# Patient Record
Sex: Female | Born: 1945 | Race: White | Hispanic: No | Marital: Married | Smoking: Former smoker
Health system: Southern US, Community
[De-identification: ages and names within clinical notes are randomized; demographics above are authoritative.]

## PROBLEM LIST (undated history)

## (undated) DIAGNOSIS — K5900 Constipation, unspecified: Secondary | ICD-10-CM

## (undated) DIAGNOSIS — M79671 Pain in right foot: Secondary | ICD-10-CM

## (undated) DIAGNOSIS — R6889 Other general symptoms and signs: Secondary | ICD-10-CM

## (undated) DIAGNOSIS — K219 Gastro-esophageal reflux disease without esophagitis: Secondary | ICD-10-CM

## (undated) DIAGNOSIS — F32A Depression, unspecified: Secondary | ICD-10-CM

## (undated) DIAGNOSIS — K759 Inflammatory liver disease, unspecified: Secondary | ICD-10-CM

## (undated) DIAGNOSIS — M2550 Pain in unspecified joint: Secondary | ICD-10-CM

## (undated) HISTORY — PX: CATARACT EXTRACTION: SUR2

## (undated) HISTORY — DX: Pain in right foot: M79.671

## (undated) HISTORY — PX: LAPAROSCOPY: SHX197

## (undated) HISTORY — PX: TONSILLECTOMY: SUR1361

## (undated) HISTORY — PX: REDUCTION MAMMAPLASTY: SUR839

## (undated) HISTORY — PX: LIVER BIOPSY: SHX301

## (undated) HISTORY — DX: Other general symptoms and signs: R68.89

## (undated) HISTORY — DX: Depression, unspecified: F32.A

## (undated) HISTORY — PX: BREAST BIOPSY: SHX20

## (undated) HISTORY — DX: Pain in unspecified joint: M25.50

## (undated) HISTORY — DX: Gastro-esophageal reflux disease without esophagitis: K21.9

## (undated) HISTORY — DX: Inflammatory liver disease, unspecified: K75.9

## (undated) HISTORY — DX: Constipation, unspecified: K59.00

## (undated) HISTORY — PX: OOPHORECTOMY: SHX86

---

## 2015-05-14 DIAGNOSIS — M25511 Pain in right shoulder: Secondary | ICD-10-CM | POA: Diagnosis not present

## 2015-05-14 DIAGNOSIS — S43431A Superior glenoid labrum lesion of right shoulder, initial encounter: Secondary | ICD-10-CM | POA: Diagnosis not present

## 2015-05-15 DIAGNOSIS — H25813 Combined forms of age-related cataract, bilateral: Secondary | ICD-10-CM | POA: Diagnosis not present

## 2015-05-16 DIAGNOSIS — R5383 Other fatigue: Secondary | ICD-10-CM | POA: Diagnosis not present

## 2015-05-28 DIAGNOSIS — H0011 Chalazion right upper eyelid: Secondary | ICD-10-CM | POA: Diagnosis not present

## 2015-05-28 DIAGNOSIS — J209 Acute bronchitis, unspecified: Secondary | ICD-10-CM | POA: Diagnosis not present

## 2015-06-01 DIAGNOSIS — M19071 Primary osteoarthritis, right ankle and foot: Secondary | ICD-10-CM | POA: Diagnosis not present

## 2015-06-11 DIAGNOSIS — M5430 Sciatica, unspecified side: Secondary | ICD-10-CM | POA: Diagnosis not present

## 2015-06-11 DIAGNOSIS — G5701 Lesion of sciatic nerve, right lower limb: Secondary | ICD-10-CM | POA: Diagnosis not present

## 2015-06-18 DIAGNOSIS — G5701 Lesion of sciatic nerve, right lower limb: Secondary | ICD-10-CM | POA: Diagnosis not present

## 2015-06-19 DIAGNOSIS — T1511XA Foreign body in conjunctival sac, right eye, initial encounter: Secondary | ICD-10-CM | POA: Diagnosis not present

## 2015-07-19 DIAGNOSIS — N951 Menopausal and female climacteric states: Secondary | ICD-10-CM | POA: Diagnosis not present

## 2015-08-29 DIAGNOSIS — B351 Tinea unguium: Secondary | ICD-10-CM | POA: Diagnosis not present

## 2015-10-18 DIAGNOSIS — R05 Cough: Secondary | ICD-10-CM | POA: Diagnosis not present

## 2015-10-24 DIAGNOSIS — D225 Melanocytic nevi of trunk: Secondary | ICD-10-CM | POA: Diagnosis not present

## 2015-11-13 DIAGNOSIS — K5900 Constipation, unspecified: Secondary | ICD-10-CM | POA: Diagnosis not present

## 2015-11-14 DIAGNOSIS — Z1231 Encounter for screening mammogram for malignant neoplasm of breast: Secondary | ICD-10-CM | POA: Diagnosis not present

## 2015-12-25 DIAGNOSIS — K5900 Constipation, unspecified: Secondary | ICD-10-CM | POA: Diagnosis not present

## 2016-02-02 DIAGNOSIS — T188XXA Foreign body in other parts of alimentary tract, initial encounter: Secondary | ICD-10-CM | POA: Diagnosis not present

## 2016-02-02 DIAGNOSIS — K449 Diaphragmatic hernia without obstruction or gangrene: Secondary | ICD-10-CM | POA: Diagnosis not present

## 2016-02-02 DIAGNOSIS — T18108A Unspecified foreign body in esophagus causing other injury, initial encounter: Secondary | ICD-10-CM | POA: Diagnosis not present

## 2016-02-02 DIAGNOSIS — T18100A Unspecified foreign body in esophagus causing compression of trachea, initial encounter: Secondary | ICD-10-CM | POA: Diagnosis not present

## 2016-02-02 DIAGNOSIS — T189XXA Foreign body of alimentary tract, part unspecified, initial encounter: Secondary | ICD-10-CM | POA: Diagnosis not present

## 2016-02-02 DIAGNOSIS — K2970 Gastritis, unspecified, without bleeding: Secondary | ICD-10-CM | POA: Diagnosis not present

## 2016-02-08 DIAGNOSIS — M25511 Pain in right shoulder: Secondary | ICD-10-CM | POA: Diagnosis not present

## 2016-02-08 DIAGNOSIS — M75101 Unspecified rotator cuff tear or rupture of right shoulder, not specified as traumatic: Secondary | ICD-10-CM | POA: Diagnosis not present

## 2016-02-11 DIAGNOSIS — M25511 Pain in right shoulder: Secondary | ICD-10-CM | POA: Diagnosis not present

## 2016-02-11 DIAGNOSIS — M75101 Unspecified rotator cuff tear or rupture of right shoulder, not specified as traumatic: Secondary | ICD-10-CM | POA: Diagnosis not present

## 2016-02-13 DIAGNOSIS — M25511 Pain in right shoulder: Secondary | ICD-10-CM | POA: Diagnosis not present

## 2016-02-13 DIAGNOSIS — M75101 Unspecified rotator cuff tear or rupture of right shoulder, not specified as traumatic: Secondary | ICD-10-CM | POA: Diagnosis not present

## 2016-02-15 DIAGNOSIS — M25511 Pain in right shoulder: Secondary | ICD-10-CM | POA: Diagnosis not present

## 2016-02-15 DIAGNOSIS — M75101 Unspecified rotator cuff tear or rupture of right shoulder, not specified as traumatic: Secondary | ICD-10-CM | POA: Diagnosis not present

## 2016-02-18 DIAGNOSIS — M25511 Pain in right shoulder: Secondary | ICD-10-CM | POA: Diagnosis not present

## 2016-02-18 DIAGNOSIS — M75101 Unspecified rotator cuff tear or rupture of right shoulder, not specified as traumatic: Secondary | ICD-10-CM | POA: Diagnosis not present

## 2016-02-20 DIAGNOSIS — M75101 Unspecified rotator cuff tear or rupture of right shoulder, not specified as traumatic: Secondary | ICD-10-CM | POA: Diagnosis not present

## 2016-02-20 DIAGNOSIS — M25511 Pain in right shoulder: Secondary | ICD-10-CM | POA: Diagnosis not present

## 2016-02-26 DIAGNOSIS — M75101 Unspecified rotator cuff tear or rupture of right shoulder, not specified as traumatic: Secondary | ICD-10-CM | POA: Diagnosis not present

## 2016-02-26 DIAGNOSIS — M25511 Pain in right shoulder: Secondary | ICD-10-CM | POA: Diagnosis not present

## 2016-02-27 DIAGNOSIS — M25511 Pain in right shoulder: Secondary | ICD-10-CM | POA: Diagnosis not present

## 2016-02-27 DIAGNOSIS — M75101 Unspecified rotator cuff tear or rupture of right shoulder, not specified as traumatic: Secondary | ICD-10-CM | POA: Diagnosis not present

## 2016-02-29 DIAGNOSIS — M25511 Pain in right shoulder: Secondary | ICD-10-CM | POA: Diagnosis not present

## 2016-02-29 DIAGNOSIS — N951 Menopausal and female climacteric states: Secondary | ICD-10-CM | POA: Diagnosis not present

## 2016-02-29 DIAGNOSIS — M75101 Unspecified rotator cuff tear or rupture of right shoulder, not specified as traumatic: Secondary | ICD-10-CM | POA: Diagnosis not present

## 2016-03-03 DIAGNOSIS — Z9289 Personal history of other medical treatment: Secondary | ICD-10-CM | POA: Diagnosis not present

## 2016-03-03 DIAGNOSIS — M75101 Unspecified rotator cuff tear or rupture of right shoulder, not specified as traumatic: Secondary | ICD-10-CM | POA: Diagnosis not present

## 2016-03-03 DIAGNOSIS — Z01419 Encounter for gynecological examination (general) (routine) without abnormal findings: Secondary | ICD-10-CM | POA: Diagnosis not present

## 2016-03-03 DIAGNOSIS — N952 Postmenopausal atrophic vaginitis: Secondary | ICD-10-CM | POA: Diagnosis not present

## 2016-03-03 DIAGNOSIS — Z1151 Encounter for screening for human papillomavirus (HPV): Secondary | ICD-10-CM | POA: Diagnosis not present

## 2016-03-03 DIAGNOSIS — M25511 Pain in right shoulder: Secondary | ICD-10-CM | POA: Diagnosis not present

## 2016-03-04 DIAGNOSIS — M25511 Pain in right shoulder: Secondary | ICD-10-CM | POA: Diagnosis not present

## 2016-03-04 DIAGNOSIS — M75101 Unspecified rotator cuff tear or rupture of right shoulder, not specified as traumatic: Secondary | ICD-10-CM | POA: Diagnosis not present

## 2016-03-06 DIAGNOSIS — M25511 Pain in right shoulder: Secondary | ICD-10-CM | POA: Diagnosis not present

## 2016-03-06 DIAGNOSIS — M75101 Unspecified rotator cuff tear or rupture of right shoulder, not specified as traumatic: Secondary | ICD-10-CM | POA: Diagnosis not present

## 2016-03-11 DIAGNOSIS — M25511 Pain in right shoulder: Secondary | ICD-10-CM | POA: Diagnosis not present

## 2016-03-11 DIAGNOSIS — M75101 Unspecified rotator cuff tear or rupture of right shoulder, not specified as traumatic: Secondary | ICD-10-CM | POA: Diagnosis not present

## 2016-03-13 DIAGNOSIS — M75101 Unspecified rotator cuff tear or rupture of right shoulder, not specified as traumatic: Secondary | ICD-10-CM | POA: Diagnosis not present

## 2016-03-13 DIAGNOSIS — M25511 Pain in right shoulder: Secondary | ICD-10-CM | POA: Diagnosis not present

## 2016-03-14 DIAGNOSIS — M75101 Unspecified rotator cuff tear or rupture of right shoulder, not specified as traumatic: Secondary | ICD-10-CM | POA: Diagnosis not present

## 2016-03-14 DIAGNOSIS — M25511 Pain in right shoulder: Secondary | ICD-10-CM | POA: Diagnosis not present

## 2016-04-29 DIAGNOSIS — M25561 Pain in right knee: Secondary | ICD-10-CM | POA: Diagnosis not present

## 2016-04-29 DIAGNOSIS — B078 Other viral warts: Secondary | ICD-10-CM | POA: Diagnosis not present

## 2016-04-29 DIAGNOSIS — L298 Other pruritus: Secondary | ICD-10-CM | POA: Diagnosis not present

## 2016-05-01 DIAGNOSIS — H0012 Chalazion right lower eyelid: Secondary | ICD-10-CM | POA: Diagnosis not present

## 2016-05-01 DIAGNOSIS — H2513 Age-related nuclear cataract, bilateral: Secondary | ICD-10-CM | POA: Diagnosis not present

## 2016-05-07 DIAGNOSIS — K5900 Constipation, unspecified: Secondary | ICD-10-CM | POA: Diagnosis not present

## 2016-05-07 DIAGNOSIS — K5730 Diverticulosis of large intestine without perforation or abscess without bleeding: Secondary | ICD-10-CM | POA: Diagnosis not present

## 2016-05-07 DIAGNOSIS — Z1211 Encounter for screening for malignant neoplasm of colon: Secondary | ICD-10-CM | POA: Diagnosis not present

## 2016-05-07 DIAGNOSIS — K6389 Other specified diseases of intestine: Secondary | ICD-10-CM | POA: Diagnosis not present

## 2016-07-04 DIAGNOSIS — M25572 Pain in left ankle and joints of left foot: Secondary | ICD-10-CM | POA: Diagnosis not present

## 2016-08-12 DIAGNOSIS — H0014 Chalazion left upper eyelid: Secondary | ICD-10-CM | POA: Diagnosis not present

## 2016-09-01 DIAGNOSIS — Z0000 Encounter for general adult medical examination without abnormal findings: Secondary | ICD-10-CM | POA: Diagnosis not present

## 2016-09-01 DIAGNOSIS — N951 Menopausal and female climacteric states: Secondary | ICD-10-CM | POA: Diagnosis not present

## 2016-09-01 DIAGNOSIS — Z008 Encounter for other general examination: Secondary | ICD-10-CM | POA: Diagnosis not present

## 2016-09-03 DIAGNOSIS — K920 Hematemesis: Secondary | ICD-10-CM | POA: Diagnosis not present

## 2016-09-29 DIAGNOSIS — L2089 Other atopic dermatitis: Secondary | ICD-10-CM | POA: Diagnosis not present

## 2016-11-06 DIAGNOSIS — L723 Sebaceous cyst: Secondary | ICD-10-CM | POA: Diagnosis not present

## 2016-11-06 DIAGNOSIS — D492 Neoplasm of unspecified behavior of bone, soft tissue, and skin: Secondary | ICD-10-CM | POA: Diagnosis not present

## 2016-11-06 DIAGNOSIS — D224 Melanocytic nevi of scalp and neck: Secondary | ICD-10-CM | POA: Diagnosis not present

## 2016-11-10 DIAGNOSIS — C44511 Basal cell carcinoma of skin of breast: Secondary | ICD-10-CM | POA: Diagnosis not present

## 2016-11-12 DIAGNOSIS — Z1231 Encounter for screening mammogram for malignant neoplasm of breast: Secondary | ICD-10-CM | POA: Diagnosis not present

## 2016-11-13 DIAGNOSIS — C44511 Basal cell carcinoma of skin of breast: Secondary | ICD-10-CM | POA: Diagnosis not present

## 2017-01-06 DIAGNOSIS — B078 Other viral warts: Secondary | ICD-10-CM | POA: Diagnosis not present

## 2017-01-06 DIAGNOSIS — L723 Sebaceous cyst: Secondary | ICD-10-CM | POA: Diagnosis not present

## 2017-03-13 DIAGNOSIS — N951 Menopausal and female climacteric states: Secondary | ICD-10-CM | POA: Diagnosis not present

## 2017-03-16 DIAGNOSIS — Z124 Encounter for screening for malignant neoplasm of cervix: Secondary | ICD-10-CM | POA: Diagnosis not present

## 2017-03-18 DIAGNOSIS — L739 Follicular disorder, unspecified: Secondary | ICD-10-CM | POA: Diagnosis not present

## 2017-03-18 DIAGNOSIS — L821 Other seborrheic keratosis: Secondary | ICD-10-CM | POA: Diagnosis not present

## 2017-03-18 DIAGNOSIS — L219 Seborrheic dermatitis, unspecified: Secondary | ICD-10-CM | POA: Diagnosis not present

## 2017-04-15 DIAGNOSIS — L700 Acne vulgaris: Secondary | ICD-10-CM | POA: Diagnosis not present

## 2017-04-15 DIAGNOSIS — Z411 Encounter for cosmetic surgery: Secondary | ICD-10-CM | POA: Diagnosis not present

## 2017-05-21 DIAGNOSIS — Z411 Encounter for cosmetic surgery: Secondary | ICD-10-CM | POA: Diagnosis not present

## 2017-05-21 DIAGNOSIS — L308 Other specified dermatitis: Secondary | ICD-10-CM | POA: Diagnosis not present

## 2017-05-21 DIAGNOSIS — L814 Other melanin hyperpigmentation: Secondary | ICD-10-CM | POA: Diagnosis not present

## 2017-05-21 DIAGNOSIS — D485 Neoplasm of uncertain behavior of skin: Secondary | ICD-10-CM | POA: Diagnosis not present

## 2017-06-16 DIAGNOSIS — F5101 Primary insomnia: Secondary | ICD-10-CM | POA: Diagnosis not present

## 2017-06-16 DIAGNOSIS — N951 Menopausal and female climacteric states: Secondary | ICD-10-CM | POA: Diagnosis not present

## 2017-08-11 ENCOUNTER — Encounter: Payer: Self-pay | Admitting: Sports Medicine

## 2017-08-11 ENCOUNTER — Ambulatory Visit (INDEPENDENT_AMBULATORY_CARE_PROVIDER_SITE_OTHER): Payer: Medicare Other | Admitting: Sports Medicine

## 2017-08-11 ENCOUNTER — Ambulatory Visit (INDEPENDENT_AMBULATORY_CARE_PROVIDER_SITE_OTHER): Payer: Medicare Other

## 2017-08-11 DIAGNOSIS — M25551 Pain in right hip: Secondary | ICD-10-CM

## 2017-08-11 DIAGNOSIS — M76891 Other specified enthesopathies of right lower limb, excluding foot: Secondary | ICD-10-CM

## 2017-08-11 DIAGNOSIS — G8929 Other chronic pain: Secondary | ICD-10-CM | POA: Insufficient documentation

## 2017-08-11 DIAGNOSIS — M7611 Psoas tendinitis, right hip: Secondary | ICD-10-CM | POA: Diagnosis not present

## 2017-08-11 IMAGING — DX DG HIP (WITH OR WITHOUT PELVIS) 2-3V*R*
3 series · 3 of 3 positions shown · non-contrast
Comparison: None.

CLINICAL DATA: Hip flexor tendinitis, right hip pain

EXAM:
DG HIP (WITH OR WITHOUT PELVIS) 2-3V RIGHT

[pelvis ap]
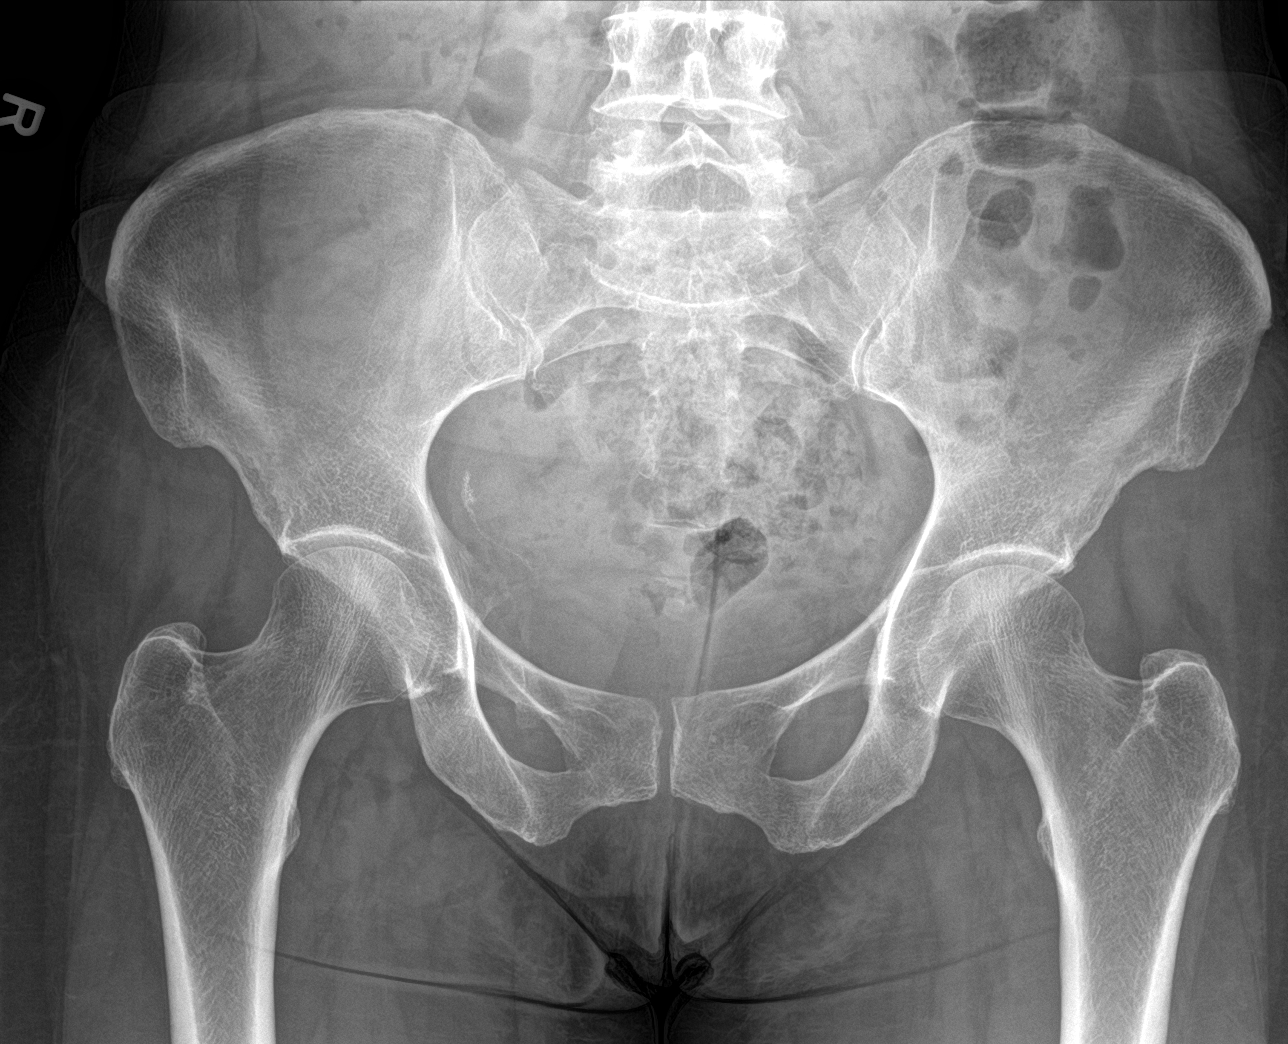

[hip ap]
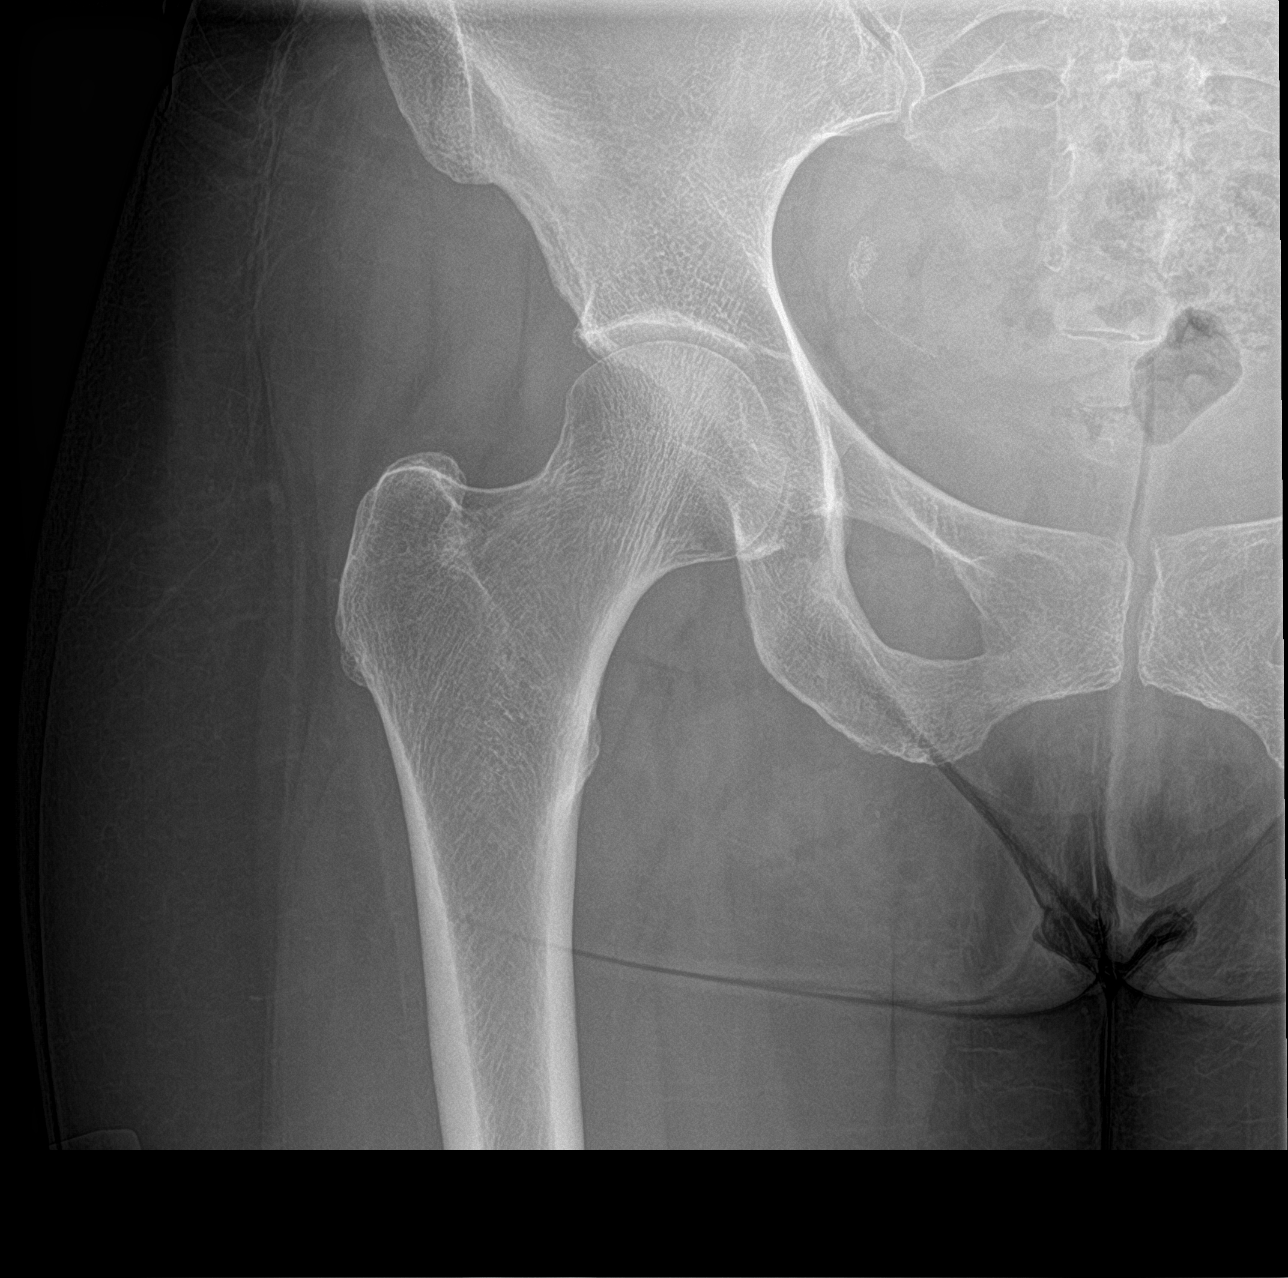

[hip frog leg]
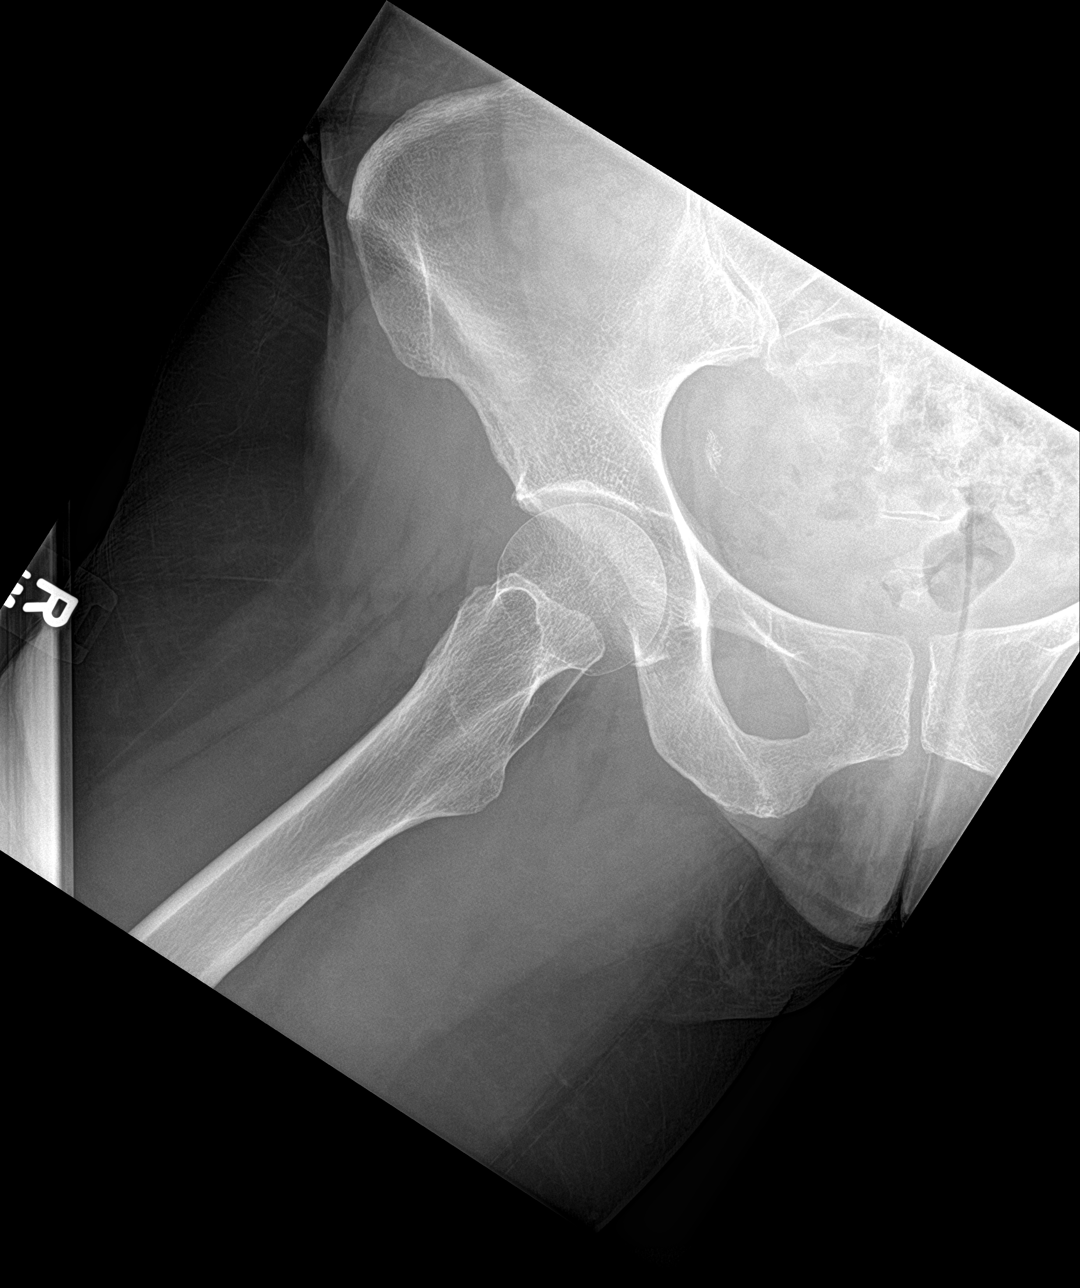

[3 of 3 positions shown; findings below may reference images not displayed]

FINDINGS: Hip joints and SI joints are symmetric and unremarkable. No acute
bony abnormality. Specifically, no fracture, subluxation, or
dislocation.
IMPRESSION: No acute bony abnormality.

## 2017-08-11 MED ORDER — IBUPROFEN 800 MG PO TABS: 800.0000 mg | ORAL_TABLET | Freq: Three times a day (TID) | ORAL | 2 refills | Status: DC | PRN

## 2017-08-11 NOTE — Progress Notes (Signed)
Subjective:    I'm seeing this patient as a consultation for: Dr. Greig Right  CC: Right hip pain  HPI: This is a very pleasant 72 year old female, she moved to [place] recently from [place] with her boyfriend, has been exercising, trying to get in shape, unfortunately she started to have pain that she localizes over the anterior aspect of her right hip and groin, worse with hip flexion, and certain poses and Pilates.  No mechanical symptoms, no trauma, no constitutional symptoms.  I reviewed the past medical history, family history, social history, surgical history, and allergies today and no changes were needed.  Please see the problem list section below in epic for further details.  Past Medical History: Past Medical History:  Diagnosis Date  . Constipation   . Hepatitis   . Joint pain    Past Surgical History: Past Surgical History:  Procedure Laterality Date  . LAPAROSCOPY    . OOPHORECTOMY     Social History: Social History   Socioeconomic History  . Marital status: Widowed    Spouse name: Not on file  . Number of children: Not on file  . Years of education: Not on file  . Highest education level: Not on file  Occupational History  . Not on file  Social Needs  . Financial resource strain: Not on file  . Food insecurity:    Worry: Not on file    Inability: Not on file  . Transportation needs:    Medical: Not on file    Non-medical: Not on file  Tobacco Use  . Smoking status: Never Smoker  . Smokeless tobacco: Never Used  Substance and Sexual Activity  . Alcohol use: Never    Frequency: Never  . Drug use: Never  . Sexual activity: Not on file  Lifestyle  . Physical activity:    Days per week: Not on file    Minutes per session: Not on file  . Stress: Not on file  Relationships  . Social connections:    Talks on phone: Not on file    Gets together: Not on file    Attends religious service: Not on file    Active member of club or organization:  Not on file    Attends meetings of clubs or organizations: Not on file    Relationship status: Not on file  Other Topics Concern  . Not on file  Social History Narrative  . Not on file   Family History: No family history on file. Allergies: Allergies  Allergen Reactions  . Sulfa Antibiotics    Medications: See med rec.  Review of Systems: No headache, visual changes, nausea, vomiting, diarrhea, constipation, dizziness, abdominal pain, skin rash, fevers, chills, night sweats, weight loss, swollen lymph nodes, body aches, joint swelling, muscle aches, chest pain, shortness of breath, mood changes, visual or auditory hallucinations.   Objective:   General: Well Developed, well nourished, and in no acute distress.  Neuro:  Extra-ocular muscles intact, able to move all 4 extremities, sensation grossly intact.  Deep tendon reflexes tested were normal. Psych: Alert and oriented, mood congruent with affect. ENT:  Ears and nose appear unremarkable.  Hearing grossly normal. Neck: Unremarkable overall appearance, trachea midline.  No visible thyroid enlargement. Eyes: Conjunctivae and lids appear unremarkable.  Pupils equal and round. Skin: Warm and dry, no rashes noted.  Cardiovascular: Pulses palpable, no extremity edema. Right hip: ROM IR: 60 Deg, ER: 60 Deg, Flexion: 120 Deg, Extension: 100 Deg, Abduction: 45 Deg,  Adduction: 45 Deg Strength IR: 5/5, ER: 5/5, Flexion: 4- /5 with reproduction of pain, Extension: 5/5, Abduction: 5/5, Adduction: 5/5 Pelvic alignment unremarkable to inspection and palpation. Standing hip rotation and gait without trendelenburg / unsteadiness. Greater trochanter without tenderness to palpation. No tenderness over piriformis. No SI joint tenderness and normal minimal SI movement. Negative flexion abduction external rotation and flexion abduction internal rotation testing.   Hip x-rays personally reviewed, overall unremarkable, no visible  osteoarthritis.  Impression and Recommendations:   This case required medical decision making of moderate complexity.  Hip flexor tendinitis, right X-rays, ibuprofen 800, formal PT. Pain is reproducible with resisted flexion of the hip, negative FABER/FADIR testing, no pain with internal rotation. No tenderness over the greater trochanter. Return to see me in 1 month, MRI if no better. ___________________________________________ Gwen Her. Dianah Field, M.D., ABFM., CAQSM. Primary Care and [place] Instructor of [place] of [facility] of Medicine

## 2017-08-11 NOTE — Assessment & Plan Note (Signed)
X-rays, ibuprofen 800, formal PT. Pain is reproducible with resisted flexion of the hip, negative FABER/FADIR testing, no pain with internal rotation. No tenderness over the greater trochanter. Return to see me in 1 month, MRI if no better.

## 2017-08-17 DIAGNOSIS — M76891 Other specified enthesopathies of right lower limb, excluding foot: Secondary | ICD-10-CM | POA: Diagnosis not present

## 2017-08-17 DIAGNOSIS — M25551 Pain in right hip: Secondary | ICD-10-CM | POA: Diagnosis not present

## 2017-08-24 DIAGNOSIS — M25551 Pain in right hip: Secondary | ICD-10-CM | POA: Diagnosis not present

## 2017-08-24 DIAGNOSIS — M76891 Other specified enthesopathies of right lower limb, excluding foot: Secondary | ICD-10-CM | POA: Diagnosis not present

## 2017-08-27 DIAGNOSIS — M25551 Pain in right hip: Secondary | ICD-10-CM | POA: Diagnosis not present

## 2017-08-27 DIAGNOSIS — M76891 Other specified enthesopathies of right lower limb, excluding foot: Secondary | ICD-10-CM | POA: Diagnosis not present

## 2017-09-01 DIAGNOSIS — M25551 Pain in right hip: Secondary | ICD-10-CM | POA: Diagnosis not present

## 2017-09-01 DIAGNOSIS — M76891 Other specified enthesopathies of right lower limb, excluding foot: Secondary | ICD-10-CM | POA: Diagnosis not present

## 2017-09-02 DIAGNOSIS — M25551 Pain in right hip: Secondary | ICD-10-CM | POA: Diagnosis not present

## 2017-09-02 DIAGNOSIS — M76891 Other specified enthesopathies of right lower limb, excluding foot: Secondary | ICD-10-CM | POA: Diagnosis not present

## 2017-09-08 DIAGNOSIS — M76891 Other specified enthesopathies of right lower limb, excluding foot: Secondary | ICD-10-CM | POA: Diagnosis not present

## 2017-09-08 DIAGNOSIS — M25551 Pain in right hip: Secondary | ICD-10-CM | POA: Diagnosis not present

## 2017-09-10 ENCOUNTER — Encounter: Payer: Self-pay | Admitting: Sports Medicine

## 2017-09-10 ENCOUNTER — Ambulatory Visit (INDEPENDENT_AMBULATORY_CARE_PROVIDER_SITE_OTHER): Payer: Medicare Other | Admitting: Sports Medicine

## 2017-09-10 DIAGNOSIS — M533 Sacrococcygeal disorders, not elsewhere classified: Secondary | ICD-10-CM

## 2017-09-10 DIAGNOSIS — M76891 Other specified enthesopathies of right lower limb, excluding foot: Secondary | ICD-10-CM

## 2017-09-10 HISTORY — DX: Sacrococcygeal disorders, not elsewhere classified: M53.3

## 2017-09-10 NOTE — Progress Notes (Signed)
Subjective:    I'm seeing this patient as a consultation for: Dr. Greig Right  CC: Right-sided low back pain  HPI: This is a pleasant 72 year old female, we treated her not too long ago for hip flexor tendinopathy which is now resolved after formal physical therapy.  Unfortunately she has now started to have pain that she localizes in her back, at the SI joint without much radiation, worse when standing on the ipsilateral side.  Not worse with sitting, flexion, Valsalva, no bowel or bladder dysfunction, saddle numbness, constitutional symptoms.  She is getting married in a week.  I reviewed the past medical history, family history, social history, surgical history, and allergies today and no changes were needed.  Please see the problem list section below in epic for further details.  Past Medical History: Past Medical History:  Diagnosis Date  . Constipation   . Hepatitis   . Joint pain    Past Surgical History: Past Surgical History:  Procedure Laterality Date  . LAPAROSCOPY    . OOPHORECTOMY     Social History: Social History   Socioeconomic History  . Marital status: Widowed    Spouse name: Not on file  . Number of children: Not on file  . Years of education: Not on file  . Highest education level: Not on file  Occupational History  . Not on file  Social Needs  . Financial resource strain: Not on file  . Food insecurity:    Worry: Not on file    Inability: Not on file  . Transportation needs:    Medical: Not on file    Non-medical: Not on file  Tobacco Use  . Smoking status: Never Smoker  . Smokeless tobacco: Never Used  Substance and Sexual Activity  . Alcohol use: Never    Frequency: Never  . Drug use: Never  . Sexual activity: Not on file  Lifestyle  . Physical activity:    Days per week: Not on file    Minutes per session: Not on file  . Stress: Not on file  Relationships  . Social connections:    Talks on phone: Not on file    Gets together:  Not on file    Attends religious service: Not on file    Active member of club or organization: Not on file    Attends meetings of clubs or organizations: Not on file    Relationship status: Not on file  Other Topics Concern  . Not on file  Social History Narrative  . Not on file   Family History: No family history on file. Allergies: Allergies  Allergen Reactions  . Sulfa Antibiotics    Medications: See med rec.  Review of Systems: No headache, visual changes, nausea, vomiting, diarrhea, constipation, dizziness, abdominal pain, skin rash, fevers, chills, night sweats, weight loss, swollen lymph nodes, body aches, joint swelling, muscle aches, chest pain, shortness of breath, mood changes, visual or auditory hallucinations.   Objective:   General: Well Developed, well nourished, and in no acute distress.  Neuro:  Extra-ocular muscles intact, able to move all 4 extremities, sensation grossly intact.  Deep tendon reflexes tested were normal. Psych: Alert and oriented, mood congruent with affect. ENT:  Ears and nose appear unremarkable.  Hearing grossly normal. Neck: Unremarkable overall appearance, trachea midline.  No visible thyroid enlargement. Eyes: Conjunctivae and lids appear unremarkable.  Pupils equal and round. Skin: Warm and dry, no rashes noted.  Cardiovascular: Pulses palpable, no extremity edema. Right hip:  ROM IR: 60 Deg, ER: 60 Deg, Flexion: 120 Deg, Extension: 100 Deg, Abduction: 45 Deg, Adduction: 45 Deg Strength IR: 5/5, ER: 5/5, Flexion: 5/5, Extension: 5/5, Abduction: 5/5, Adduction: 5/5 Pelvic alignment unremarkable to inspection and palpation. Standing hip rotation and gait without trendelenburg / unsteadiness. Greater trochanter without tenderness to palpation. No tenderness over piriformis. Moderate tenderness over the sacroiliac joint. Excellent strength to flexion of the hip joint without pain.  Impression and Recommendations:   This case required  medical decision making of moderate complexity.  Pain of right sacroiliac joint We will start conservatively. SI joint rehab exercises, return in a month, injection if no better.  Hip flexor tendinitis, right Resolved with conservative measures, good strength, good motion.  No pain.  ___________________________________________ Gwen Her. Dianah Field, M.D., ABFM., CAQSM. Primary Care and [place] Instructor of [place] of [facility] of Medicine

## 2017-09-10 NOTE — Assessment & Plan Note (Addendum)
We will start conservatively. SI joint rehab exercises, return in a month, injection if no better.

## 2017-09-10 NOTE — Assessment & Plan Note (Signed)
Resolved with conservative measures, good strength, good motion.  No pain.

## 2017-09-23 DIAGNOSIS — Z79899 Other long term (current) drug therapy: Secondary | ICD-10-CM | POA: Diagnosis not present

## 2017-09-23 DIAGNOSIS — E559 Vitamin D deficiency, unspecified: Secondary | ICD-10-CM | POA: Diagnosis not present

## 2017-09-23 DIAGNOSIS — M76891 Other specified enthesopathies of right lower limb, excluding foot: Secondary | ICD-10-CM | POA: Diagnosis not present

## 2017-09-23 DIAGNOSIS — M25551 Pain in right hip: Secondary | ICD-10-CM | POA: Diagnosis not present

## 2017-09-23 DIAGNOSIS — Z0000 Encounter for general adult medical examination without abnormal findings: Secondary | ICD-10-CM | POA: Diagnosis not present

## 2017-09-23 DIAGNOSIS — Z6822 Body mass index (BMI) 22.0-22.9, adult: Secondary | ICD-10-CM | POA: Diagnosis not present

## 2017-10-08 ENCOUNTER — Encounter: Payer: Self-pay | Admitting: Sports Medicine

## 2017-10-08 ENCOUNTER — Ambulatory Visit (INDEPENDENT_AMBULATORY_CARE_PROVIDER_SITE_OTHER): Payer: Medicare Other | Admitting: Sports Medicine

## 2017-10-08 DIAGNOSIS — M533 Sacrococcygeal disorders, not elsewhere classified: Secondary | ICD-10-CM | POA: Diagnosis not present

## 2017-10-08 NOTE — Progress Notes (Signed)
Subjective:    CC: Follow-up  HPI: Akya returns to discuss her SI joint pain and hip flexor pain, both have improved considerably, she has not done physical therapy in some time now, would like some more time in PT as she has not yet plateaued.  I reviewed the past medical history, family history, social history, surgical history, and allergies today and no changes were needed.  Please see the problem list section below in epic for further details.  Past Medical History: Past Medical History:  Diagnosis Date  . Constipation   . Hepatitis   . Joint pain    Past Surgical History: Past Surgical History:  Procedure Laterality Date  . LAPAROSCOPY    . OOPHORECTOMY     Social History: Social History   Socioeconomic History  . Marital status: Widowed    Spouse name: Not on file  . Number of children: Not on file  . Years of education: Not on file  . Highest education level: Not on file  Occupational History  . Not on file  Social Needs  . Financial resource strain: Not on file  . Food insecurity:    Worry: Not on file    Inability: Not on file  . Transportation needs:    Medical: Not on file    Non-medical: Not on file  Tobacco Use  . Smoking status: Never Smoker  . Smokeless tobacco: Never Used  Substance and Sexual Activity  . Alcohol use: Never    Frequency: Never  . Drug use: Never  . Sexual activity: Not on file  Lifestyle  . Physical activity:    Days per week: Not on file    Minutes per session: Not on file  . Stress: Not on file  Relationships  . Social connections:    Talks on phone: Not on file    Gets together: Not on file    Attends religious service: Not on file    Active member of club or organization: Not on file    Attends meetings of clubs or organizations: Not on file    Relationship status: Not on file  Other Topics Concern  . Not on file  Social History Narrative  . Not on file   Family History: No family history on  file. Allergies: Allergies  Allergen Reactions  . Sulfa Antibiotics    Medications: See med rec.  Review of Systems: No fevers, chills, night sweats, weight loss, chest pain, or shortness of breath.   Objective:    General: Well Developed, well nourished, and in no acute distress.  Neuro: Alert and oriented x3, extra-ocular muscles intact, sensation grossly intact.  HEENT: Normocephalic, atraumatic, pupils equal round reactive to light, neck supple, no masses, no lymphadenopathy, thyroid nonpalpable.  Skin: Warm and dry, no rashes. Cardiac: Regular rate and rhythm, no murmurs rubs or gallops, no lower extremity edema.  Respiratory: Clear to auscultation bilaterally. Not using accessory muscles, speaking in full sentences. Right hip: ROM IR: 60 Deg, ER: 60 Deg, Flexion: 120 Deg, Extension: 100 Deg, Abduction: 45 Deg, Adduction: 45 Deg Strength IR: 5/5, ER: 5/5, Flexion: 5/5, Extension: 5/5, Abduction: 5/5, Adduction: 5/5 Pelvic alignment unremarkable to inspection and palpation. Standing hip rotation and gait without trendelenburg / unsteadiness. Greater trochanter without tenderness to palpation. No tenderness over piriformis. No SI joint tenderness and normal minimal SI movement. Back Exam:  Inspection: Unremarkable  Motion: Flexion 45 deg, Extension 45 deg, Side Bending to 45 deg bilaterally,  Rotation to 45 deg bilaterally  SLR laying: Negative  XSLR laying: Negative  Palpable tenderness: Mild tenderness at the right sacroiliac joint. FABER: negative. Sensory change: Gross sensation intact to all lumbar and sacral dermatomes.  Reflexes: 2+ at both patellar tendons, 2+ at achilles tendons, Babinski's downgoing.  Strength at foot  Plantar-flexion: 5/5 Dorsi-flexion: 5/5 Eversion: 5/5 Inversion: 5/5  Leg strength  Quad: 5/5 Hamstring: 5/5 Hip flexor: 5/5 Hip abductors: 5/5  Gait unremarkable.  Impression and Recommendations:    Pain of right sacroiliac joint Improved  but still with some pain, we are going to add another month of physical therapy and regimen. Return in 1 month, we will consider an injection if no better. ___________________________________________ Gwen Her. Dianah Field, M.D., ABFM., CAQSM. Primary Care and [place] Instructor of [place] of [facility] of Medicine

## 2017-10-08 NOTE — Assessment & Plan Note (Signed)
Improved but still with some pain, we are going to add another month of physical therapy and regimen. Return in 1 month, we will consider an injection if no better.

## 2017-10-09 DIAGNOSIS — M76891 Other specified enthesopathies of right lower limb, excluding foot: Secondary | ICD-10-CM | POA: Diagnosis not present

## 2017-10-09 DIAGNOSIS — M25551 Pain in right hip: Secondary | ICD-10-CM | POA: Diagnosis not present

## 2017-10-09 DIAGNOSIS — M533 Sacrococcygeal disorders, not elsewhere classified: Secondary | ICD-10-CM | POA: Diagnosis not present

## 2017-10-13 DIAGNOSIS — M533 Sacrococcygeal disorders, not elsewhere classified: Secondary | ICD-10-CM | POA: Diagnosis not present

## 2017-10-13 DIAGNOSIS — M76891 Other specified enthesopathies of right lower limb, excluding foot: Secondary | ICD-10-CM | POA: Diagnosis not present

## 2017-10-13 DIAGNOSIS — M25551 Pain in right hip: Secondary | ICD-10-CM | POA: Diagnosis not present

## 2017-10-15 DIAGNOSIS — M25551 Pain in right hip: Secondary | ICD-10-CM | POA: Diagnosis not present

## 2017-10-15 DIAGNOSIS — M76891 Other specified enthesopathies of right lower limb, excluding foot: Secondary | ICD-10-CM | POA: Diagnosis not present

## 2017-10-15 DIAGNOSIS — M533 Sacrococcygeal disorders, not elsewhere classified: Secondary | ICD-10-CM | POA: Diagnosis not present

## 2017-10-19 DIAGNOSIS — M25551 Pain in right hip: Secondary | ICD-10-CM | POA: Diagnosis not present

## 2017-10-19 DIAGNOSIS — M533 Sacrococcygeal disorders, not elsewhere classified: Secondary | ICD-10-CM | POA: Diagnosis not present

## 2017-10-19 DIAGNOSIS — M76891 Other specified enthesopathies of right lower limb, excluding foot: Secondary | ICD-10-CM | POA: Diagnosis not present

## 2017-10-21 DIAGNOSIS — M533 Sacrococcygeal disorders, not elsewhere classified: Secondary | ICD-10-CM | POA: Diagnosis not present

## 2017-10-21 DIAGNOSIS — M25551 Pain in right hip: Secondary | ICD-10-CM | POA: Diagnosis not present

## 2017-10-21 DIAGNOSIS — M76891 Other specified enthesopathies of right lower limb, excluding foot: Secondary | ICD-10-CM | POA: Diagnosis not present

## 2017-10-22 DIAGNOSIS — L821 Other seborrheic keratosis: Secondary | ICD-10-CM | POA: Diagnosis not present

## 2017-10-22 DIAGNOSIS — Z85828 Personal history of other malignant neoplasm of skin: Secondary | ICD-10-CM | POA: Diagnosis not present

## 2017-10-22 DIAGNOSIS — L814 Other melanin hyperpigmentation: Secondary | ICD-10-CM | POA: Diagnosis not present

## 2017-10-22 DIAGNOSIS — D1801 Hemangioma of skin and subcutaneous tissue: Secondary | ICD-10-CM | POA: Diagnosis not present

## 2017-10-22 DIAGNOSIS — D485 Neoplasm of uncertain behavior of skin: Secondary | ICD-10-CM | POA: Diagnosis not present

## 2017-10-22 DIAGNOSIS — L570 Actinic keratosis: Secondary | ICD-10-CM | POA: Diagnosis not present

## 2017-10-22 DIAGNOSIS — D225 Melanocytic nevi of trunk: Secondary | ICD-10-CM | POA: Diagnosis not present

## 2017-10-26 DIAGNOSIS — M533 Sacrococcygeal disorders, not elsewhere classified: Secondary | ICD-10-CM | POA: Diagnosis not present

## 2017-10-26 DIAGNOSIS — M76891 Other specified enthesopathies of right lower limb, excluding foot: Secondary | ICD-10-CM | POA: Diagnosis not present

## 2017-10-26 DIAGNOSIS — M25551 Pain in right hip: Secondary | ICD-10-CM | POA: Diagnosis not present

## 2017-11-04 DIAGNOSIS — M25551 Pain in right hip: Secondary | ICD-10-CM | POA: Diagnosis not present

## 2017-11-04 DIAGNOSIS — M533 Sacrococcygeal disorders, not elsewhere classified: Secondary | ICD-10-CM | POA: Diagnosis not present

## 2017-11-04 DIAGNOSIS — M76891 Other specified enthesopathies of right lower limb, excluding foot: Secondary | ICD-10-CM | POA: Diagnosis not present

## 2017-11-12 ENCOUNTER — Ambulatory Visit (INDEPENDENT_AMBULATORY_CARE_PROVIDER_SITE_OTHER): Payer: Medicare Other | Admitting: Sports Medicine

## 2017-11-12 ENCOUNTER — Ambulatory Visit (INDEPENDENT_AMBULATORY_CARE_PROVIDER_SITE_OTHER): Payer: Medicare Other

## 2017-11-12 ENCOUNTER — Encounter: Payer: Self-pay | Admitting: Sports Medicine

## 2017-11-12 DIAGNOSIS — G8929 Other chronic pain: Secondary | ICD-10-CM

## 2017-11-12 DIAGNOSIS — M50321 Other cervical disc degeneration at C4-C5 level: Secondary | ICD-10-CM | POA: Diagnosis not present

## 2017-11-12 DIAGNOSIS — M25511 Pain in right shoulder: Secondary | ICD-10-CM | POA: Diagnosis not present

## 2017-11-12 DIAGNOSIS — M4802 Spinal stenosis, cervical region: Secondary | ICD-10-CM

## 2017-11-12 DIAGNOSIS — M542 Cervicalgia: Secondary | ICD-10-CM | POA: Diagnosis not present

## 2017-11-12 DIAGNOSIS — M533 Sacrococcygeal disorders, not elsewhere classified: Secondary | ICD-10-CM | POA: Diagnosis not present

## 2017-11-12 DIAGNOSIS — M5412 Radiculopathy, cervical region: Secondary | ICD-10-CM

## 2017-11-12 DIAGNOSIS — M47812 Spondylosis without myelopathy or radiculopathy, cervical region: Secondary | ICD-10-CM | POA: Diagnosis not present
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
## 2017-11-12 NOTE — Assessment & Plan Note (Signed)
Classic right periscapular cervical radiculitis. Referral for physical therapy with dry needling for this as well. I also want some baseline x-rays.

## 2017-11-12 NOTE — Assessment & Plan Note (Signed)
Almost completely resolved with dry needling, formal therapy. No injection was needed.

## 2017-11-12 NOTE — Assessment & Plan Note (Signed)
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

## 2017-11-12 NOTE — Progress Notes (Signed)
Subjective:    CC: Follow-up  HPI: SI joint pain: For the most part resolved with physical therapy and dry needling.  Neck pain: Radiating around the right periscapular region, worse with slouching, prolonged flexion of the neck, no radiculopathy down to the hands or fingertips, no weakness or dropping things, no constitutional symptoms or trauma.  Right shoulder pain: History of what she was told is a torn labrum, only minimal pain at the anterior and posterior joint lines without mechanical symptoms.  I reviewed the past medical history, family history, social history, surgical history, and allergies today and no changes were needed.  Please see the problem list section below in epic for further details.  Past Medical History: Past Medical History:  Diagnosis Date  . Constipation   . Hepatitis   . Joint pain    Past Surgical History: Past Surgical History:  Procedure Laterality Date  . LAPAROSCOPY    . OOPHORECTOMY     Social History: Social History   Socioeconomic History  . Marital status: Widowed    Spouse name: Not on file  . Number of children: Not on file  . Years of education: Not on file  . Highest education level: Not on file  Occupational History  . Not on file  Social Needs  . Financial resource strain: Not on file  . Food insecurity:    Worry: Not on file    Inability: Not on file  . Transportation needs:    Medical: Not on file    Non-medical: Not on file  Tobacco Use  . Smoking status: Never Smoker  . Smokeless tobacco: Never Used  Substance and Sexual Activity  . Alcohol use: Never    Frequency: Never  . Drug use: Never  . Sexual activity: Not on file  Lifestyle  . Physical activity:    Days per week: Not on file    Minutes per session: Not on file  . Stress: Not on file  Relationships  . Social connections:    Talks on phone: Not on file    Gets together: Not on file    Attends religious service: Not on file    Active member of club  or organization: Not on file    Attends meetings of clubs or organizations: Not on file    Relationship status: Not on file  Other Topics Concern  . Not on file  Social History Narrative  . Not on file   Family History: No family history on file. Allergies: Allergies  Allergen Reactions  . Sulfa Antibiotics    Medications: See med rec.  Review of Systems: No fevers, chills, night sweats, weight loss, chest pain, or shortness of breath.   Objective:    General: Well Developed, well nourished, and in no acute distress.  Neuro: Alert and oriented x3, extra-ocular muscles intact, sensation grossly intact.  HEENT: Normocephalic, atraumatic, pupils equal round reactive to light, neck supple, no masses, no lymphadenopathy, thyroid nonpalpable.  Skin: Warm and dry, no rashes. Cardiac: Regular rate and rhythm, no murmurs rubs or gallops, no lower extremity edema.  Respiratory: Clear to auscultation bilaterally. Not using accessory muscles, speaking in full sentences. Neck: Negative spurling's Full neck range of motion Grip strength and sensation normal in bilateral hands Strength good C4 to T1 distribution No sensory change to C4 to T1 Reflexes normal  Impression and Recommendations:    Radiculitis of right cervical region Classic right periscapular cervical radiculitis. Referral for physical therapy with dry needling for this  as well. I also want some baseline x-rays.  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  Pain of right sacroiliac joint Almost completely resolved with dry needling, formal therapy. No injection was needed. ___________________________________________ Gwen Her. Dianah Field, M.D., ABFM., CAQSM. Primary Care and [place] Instructor of [place] of [facility] of Medicine

## 2017-11-13 DIAGNOSIS — Z1231 Encounter for screening mammogram for malignant neoplasm of breast: Secondary | ICD-10-CM | POA: Diagnosis not present

## 2017-11-16 DIAGNOSIS — M76891 Other specified enthesopathies of right lower limb, excluding foot: Secondary | ICD-10-CM | POA: Diagnosis not present

## 2017-11-16 DIAGNOSIS — M25551 Pain in right hip: Secondary | ICD-10-CM | POA: Diagnosis not present

## 2017-11-16 DIAGNOSIS — M533 Sacrococcygeal disorders, not elsewhere classified: Secondary | ICD-10-CM | POA: Diagnosis not present

## 2017-11-17 DIAGNOSIS — M76891 Other specified enthesopathies of right lower limb, excluding foot: Secondary | ICD-10-CM | POA: Diagnosis not present

## 2017-11-17 DIAGNOSIS — M25551 Pain in right hip: Secondary | ICD-10-CM | POA: Diagnosis not present

## 2017-11-17 DIAGNOSIS — M542 Cervicalgia: Secondary | ICD-10-CM | POA: Diagnosis not present

## 2017-11-17 DIAGNOSIS — M533 Sacrococcygeal disorders, not elsewhere classified: Secondary | ICD-10-CM | POA: Diagnosis not present

## 2017-11-24 DIAGNOSIS — M25551 Pain in right hip: Secondary | ICD-10-CM | POA: Diagnosis not present

## 2017-11-24 DIAGNOSIS — M542 Cervicalgia: Secondary | ICD-10-CM | POA: Diagnosis not present

## 2017-11-24 DIAGNOSIS — M76891 Other specified enthesopathies of right lower limb, excluding foot: Secondary | ICD-10-CM | POA: Diagnosis not present

## 2017-11-24 DIAGNOSIS — M533 Sacrococcygeal disorders, not elsewhere classified: Secondary | ICD-10-CM | POA: Diagnosis not present

## 2017-11-26 DIAGNOSIS — M542 Cervicalgia: Secondary | ICD-10-CM | POA: Diagnosis not present

## 2017-11-26 DIAGNOSIS — M533 Sacrococcygeal disorders, not elsewhere classified: Secondary | ICD-10-CM | POA: Diagnosis not present

## 2017-11-26 DIAGNOSIS — M25551 Pain in right hip: Secondary | ICD-10-CM | POA: Diagnosis not present

## 2017-11-26 DIAGNOSIS — M76891 Other specified enthesopathies of right lower limb, excluding foot: Secondary | ICD-10-CM | POA: Diagnosis not present

## 2017-11-30 DIAGNOSIS — M76891 Other specified enthesopathies of right lower limb, excluding foot: Secondary | ICD-10-CM | POA: Diagnosis not present

## 2017-11-30 DIAGNOSIS — M533 Sacrococcygeal disorders, not elsewhere classified: Secondary | ICD-10-CM | POA: Diagnosis not present

## 2017-11-30 DIAGNOSIS — M542 Cervicalgia: Secondary | ICD-10-CM | POA: Diagnosis not present

## 2017-11-30 DIAGNOSIS — M25551 Pain in right hip: Secondary | ICD-10-CM | POA: Diagnosis not present

## 2017-12-02 DIAGNOSIS — M542 Cervicalgia: Secondary | ICD-10-CM | POA: Diagnosis not present

## 2017-12-02 DIAGNOSIS — M25551 Pain in right hip: Secondary | ICD-10-CM | POA: Diagnosis not present

## 2017-12-02 DIAGNOSIS — M76891 Other specified enthesopathies of right lower limb, excluding foot: Secondary | ICD-10-CM | POA: Diagnosis not present

## 2017-12-02 DIAGNOSIS — M533 Sacrococcygeal disorders, not elsewhere classified: Secondary | ICD-10-CM | POA: Diagnosis not present

## 2017-12-18 DIAGNOSIS — L237 Allergic contact dermatitis due to plants, except food: Secondary | ICD-10-CM | POA: Diagnosis not present

## 2017-12-23 DIAGNOSIS — L259 Unspecified contact dermatitis, unspecified cause: Secondary | ICD-10-CM | POA: Diagnosis not present

## 2017-12-24 ENCOUNTER — Ambulatory Visit: Payer: PRIVATE HEALTH INSURANCE | Admitting: Sports Medicine

## 2017-12-31 ENCOUNTER — Ambulatory Visit: Payer: Medicare Other | Admitting: Sports Medicine

## 2018-01-12 ENCOUNTER — Encounter: Payer: Self-pay | Admitting: Sports Medicine

## 2018-01-12 ENCOUNTER — Ambulatory Visit (INDEPENDENT_AMBULATORY_CARE_PROVIDER_SITE_OTHER): Payer: Medicare Other | Admitting: Sports Medicine

## 2018-01-12 DIAGNOSIS — M5412 Radiculopathy, cervical region: Secondary | ICD-10-CM | POA: Diagnosis not present

## 2018-01-12 NOTE — Progress Notes (Signed)
Subjective:    CC: Several issues  HPI: Cervical radiculitis: Improved slightly with formal physical therapy, she had right classic periscapular cervical symptoms.  She notes that when she is under stress or emotional distress her symptoms worsen.  Her sister was recently diagnosed with stage IV pancreatic cancer, she is thus hurting all over now.  At this point she would like to put aggressive treatment on the back burner for now.  I reviewed the past medical history, family history, social history, surgical history, and allergies today and no changes were needed.  Please see the problem list section below in epic for further details.  Past Medical History: Past Medical History:  Diagnosis Date  . Constipation   . Hepatitis   . Joint pain    Past Surgical History: Past Surgical History:  Procedure Laterality Date  . LAPAROSCOPY    . OOPHORECTOMY     Social History: Social History   Socioeconomic History  . Marital status: Widowed    Spouse name: Not on file  . Number of children: Not on file  . Years of education: Not on file  . Highest education level: Not on file  Occupational History  . Not on file  Social Needs  . Financial resource strain: Not on file  . Food insecurity:    Worry: Not on file    Inability: Not on file  . Transportation needs:    Medical: Not on file    Non-medical: Not on file  Tobacco Use  . Smoking status: Never Smoker  . Smokeless tobacco: Never Used  Substance and Sexual Activity  . Alcohol use: Never    Frequency: Never  . Drug use: Never  . Sexual activity: Not on file  Lifestyle  . Physical activity:    Days per week: Not on file    Minutes per session: Not on file  . Stress: Not on file  Relationships  . Social connections:    Talks on phone: Not on file    Gets together: Not on file    Attends religious service: Not on file    Active member of club or organization: Not on file    Attends meetings of clubs or organizations:  Not on file    Relationship status: Not on file  Other Topics Concern  . Not on file  Social History Narrative  . Not on file   Family History: No family history on file. Allergies: Allergies  Allergen Reactions  . Sulfa Antibiotics    Medications: See med rec.  Review of Systems: No fevers, chills, night sweats, weight loss, chest pain, or shortness of breath.   Objective:    General: Well Developed, well nourished, and in no acute distress.  Neuro: Alert and oriented x3, extra-ocular muscles intact, sensation grossly intact.  HEENT: Normocephalic, atraumatic, pupils equal round reactive to light, neck supple, no masses, no lymphadenopathy, thyroid nonpalpable.  Skin: Warm and dry, no rashes. Cardiac: Regular rate and rhythm, no murmurs rubs or gallops, no lower extremity edema.  Respiratory: Clear to auscultation bilaterally. Not using accessory muscles, speaking in full sentences.  Impression and Recommendations:    Radiculitis of right cervical region Doing okay, sister recently diagnosed with cancer, the increased stress has worsened her neck pain.  I spent 25 minutes with this patient, greater than 50% was face-to-face time counseling regarding the above diagnoses, the discussion was regarding psychosomatic interaction with regards to pain. ___________________________________________ Gwen Her. Dianah Field, M.D., ABFM., CAQSM. Primary Care and Sports  [place] Instructor of [place] of [facility] of Medicine

## 2018-01-12 NOTE — Assessment & Plan Note (Signed)
Doing okay, sister recently diagnosed with cancer, the increased stress has worsened her neck pain.

## 2018-03-08 DIAGNOSIS — F411 Generalized anxiety disorder: Secondary | ICD-10-CM | POA: Diagnosis not present

## 2018-03-08 DIAGNOSIS — Z6823 Body mass index (BMI) 23.0-23.9, adult: Secondary | ICD-10-CM | POA: Diagnosis not present

## 2018-05-12 DIAGNOSIS — J069 Acute upper respiratory infection, unspecified: Secondary | ICD-10-CM | POA: Diagnosis not present

## 2018-06-03 DIAGNOSIS — D485 Neoplasm of uncertain behavior of skin: Secondary | ICD-10-CM | POA: Diagnosis not present

## 2018-06-03 DIAGNOSIS — L570 Actinic keratosis: Secondary | ICD-10-CM | POA: Diagnosis not present

## 2018-06-03 DIAGNOSIS — L988 Other specified disorders of the skin and subcutaneous tissue: Secondary | ICD-10-CM | POA: Diagnosis not present

## 2018-06-03 DIAGNOSIS — Z23 Encounter for immunization: Secondary | ICD-10-CM | POA: Diagnosis not present

## 2018-06-03 DIAGNOSIS — Z411 Encounter for cosmetic surgery: Secondary | ICD-10-CM | POA: Diagnosis not present

## 2018-06-03 DIAGNOSIS — L603 Nail dystrophy: Secondary | ICD-10-CM | POA: Diagnosis not present

## 2018-07-21 DIAGNOSIS — R509 Fever, unspecified: Secondary | ICD-10-CM | POA: Diagnosis not present

## 2018-07-21 DIAGNOSIS — R112 Nausea with vomiting, unspecified: Secondary | ICD-10-CM | POA: Diagnosis not present

## 2018-10-13 DIAGNOSIS — F411 Generalized anxiety disorder: Secondary | ICD-10-CM | POA: Diagnosis not present

## 2018-10-13 DIAGNOSIS — E559 Vitamin D deficiency, unspecified: Secondary | ICD-10-CM | POA: Diagnosis not present

## 2018-10-13 DIAGNOSIS — M8580 Other specified disorders of bone density and structure, unspecified site: Secondary | ICD-10-CM | POA: Diagnosis not present

## 2018-10-13 DIAGNOSIS — B1920 Unspecified viral hepatitis C without hepatic coma: Secondary | ICD-10-CM | POA: Diagnosis not present

## 2018-10-13 DIAGNOSIS — Z6821 Body mass index (BMI) 21.0-21.9, adult: Secondary | ICD-10-CM | POA: Diagnosis not present

## 2018-10-13 DIAGNOSIS — F5101 Primary insomnia: Secondary | ICD-10-CM | POA: Diagnosis not present

## 2018-10-13 DIAGNOSIS — Z0000 Encounter for general adult medical examination without abnormal findings: Secondary | ICD-10-CM | POA: Diagnosis not present

## 2018-10-13 DIAGNOSIS — M25512 Pain in left shoulder: Secondary | ICD-10-CM | POA: Diagnosis not present

## 2018-10-13 DIAGNOSIS — Z79899 Other long term (current) drug therapy: Secondary | ICD-10-CM | POA: Diagnosis not present

## 2018-10-13 DIAGNOSIS — Z2821 Immunization not carried out because of patient refusal: Secondary | ICD-10-CM | POA: Diagnosis not present

## 2018-10-13 DIAGNOSIS — N644 Mastodynia: Secondary | ICD-10-CM | POA: Diagnosis not present

## 2018-11-02 DIAGNOSIS — Z85828 Personal history of other malignant neoplasm of skin: Secondary | ICD-10-CM | POA: Diagnosis not present

## 2018-11-02 DIAGNOSIS — L814 Other melanin hyperpigmentation: Secondary | ICD-10-CM | POA: Diagnosis not present

## 2018-11-02 DIAGNOSIS — L821 Other seborrheic keratosis: Secondary | ICD-10-CM | POA: Diagnosis not present

## 2018-11-02 DIAGNOSIS — D225 Melanocytic nevi of trunk: Secondary | ICD-10-CM | POA: Diagnosis not present

## 2018-11-25 DIAGNOSIS — N6489 Other specified disorders of breast: Secondary | ICD-10-CM | POA: Diagnosis not present

## 2018-11-25 DIAGNOSIS — R922 Inconclusive mammogram: Secondary | ICD-10-CM | POA: Diagnosis not present

## 2018-11-25 DIAGNOSIS — R928 Other abnormal and inconclusive findings on diagnostic imaging of breast: Secondary | ICD-10-CM | POA: Diagnosis not present

## 2019-01-25 DIAGNOSIS — R42 Dizziness and giddiness: Secondary | ICD-10-CM | POA: Diagnosis not present

## 2019-02-01 DIAGNOSIS — Z411 Encounter for cosmetic surgery: Secondary | ICD-10-CM | POA: Diagnosis not present

## 2019-02-01 DIAGNOSIS — D1030 Benign neoplasm of unspecified part of mouth: Secondary | ICD-10-CM | POA: Diagnosis not present

## 2019-02-21 DIAGNOSIS — D100 Benign neoplasm of lip: Secondary | ICD-10-CM | POA: Insufficient documentation

## 2019-02-21 DIAGNOSIS — D1030 Benign neoplasm of unspecified part of mouth: Secondary | ICD-10-CM | POA: Diagnosis not present

## 2019-02-24 DIAGNOSIS — Z411 Encounter for cosmetic surgery: Secondary | ICD-10-CM | POA: Diagnosis not present

## 2019-02-24 DIAGNOSIS — L821 Other seborrheic keratosis: Secondary | ICD-10-CM | POA: Diagnosis not present

## 2019-03-02 DIAGNOSIS — D100 Benign neoplasm of lip: Secondary | ICD-10-CM | POA: Diagnosis not present

## 2019-05-24 ENCOUNTER — Ambulatory Visit (INDEPENDENT_AMBULATORY_CARE_PROVIDER_SITE_OTHER): Payer: Medicare Other | Admitting: Sports Medicine

## 2019-05-24 ENCOUNTER — Ambulatory Visit (INDEPENDENT_AMBULATORY_CARE_PROVIDER_SITE_OTHER): Payer: Medicare Other

## 2019-05-24 ENCOUNTER — Other Ambulatory Visit: Payer: Self-pay

## 2019-05-24 DIAGNOSIS — M542 Cervicalgia: Secondary | ICD-10-CM | POA: Diagnosis not present

## 2019-05-24 DIAGNOSIS — M25562 Pain in left knee: Secondary | ICD-10-CM

## 2019-05-24 DIAGNOSIS — M25561 Pain in right knee: Secondary | ICD-10-CM | POA: Diagnosis not present

## 2019-05-24 DIAGNOSIS — S199XXA Unspecified injury of neck, initial encounter: Secondary | ICD-10-CM | POA: Diagnosis not present

## 2019-05-24 DIAGNOSIS — M25532 Pain in left wrist: Secondary | ICD-10-CM | POA: Diagnosis not present

## 2019-05-24 DIAGNOSIS — W19XXXA Unspecified fall, initial encounter: Secondary | ICD-10-CM

## 2019-05-24 DIAGNOSIS — S8991XA Unspecified injury of right lower leg, initial encounter: Secondary | ICD-10-CM | POA: Diagnosis not present

## 2019-05-24 DIAGNOSIS — S4992XA Unspecified injury of left shoulder and upper arm, initial encounter: Secondary | ICD-10-CM | POA: Diagnosis not present

## 2019-05-24 DIAGNOSIS — S6992XA Unspecified injury of left wrist, hand and finger(s), initial encounter: Secondary | ICD-10-CM | POA: Diagnosis not present

## 2019-05-24 DIAGNOSIS — M25512 Pain in left shoulder: Secondary | ICD-10-CM | POA: Diagnosis not present

## 2019-05-24 DIAGNOSIS — M25531 Pain in right wrist: Secondary | ICD-10-CM | POA: Insufficient documentation

## 2019-05-24 DIAGNOSIS — S8992XA Unspecified injury of left lower leg, initial encounter: Secondary | ICD-10-CM | POA: Diagnosis not present

## 2019-05-24 IMAGING — DX DG SHOULDER 2+V*L*
3 series · 3 of 3 positions shown · non-contrast
Comparison: None.

CLINICAL DATA: Status post fall.

EXAM:
LEFT SHOULDER - 2+ VIEW

[shoulder grashey]
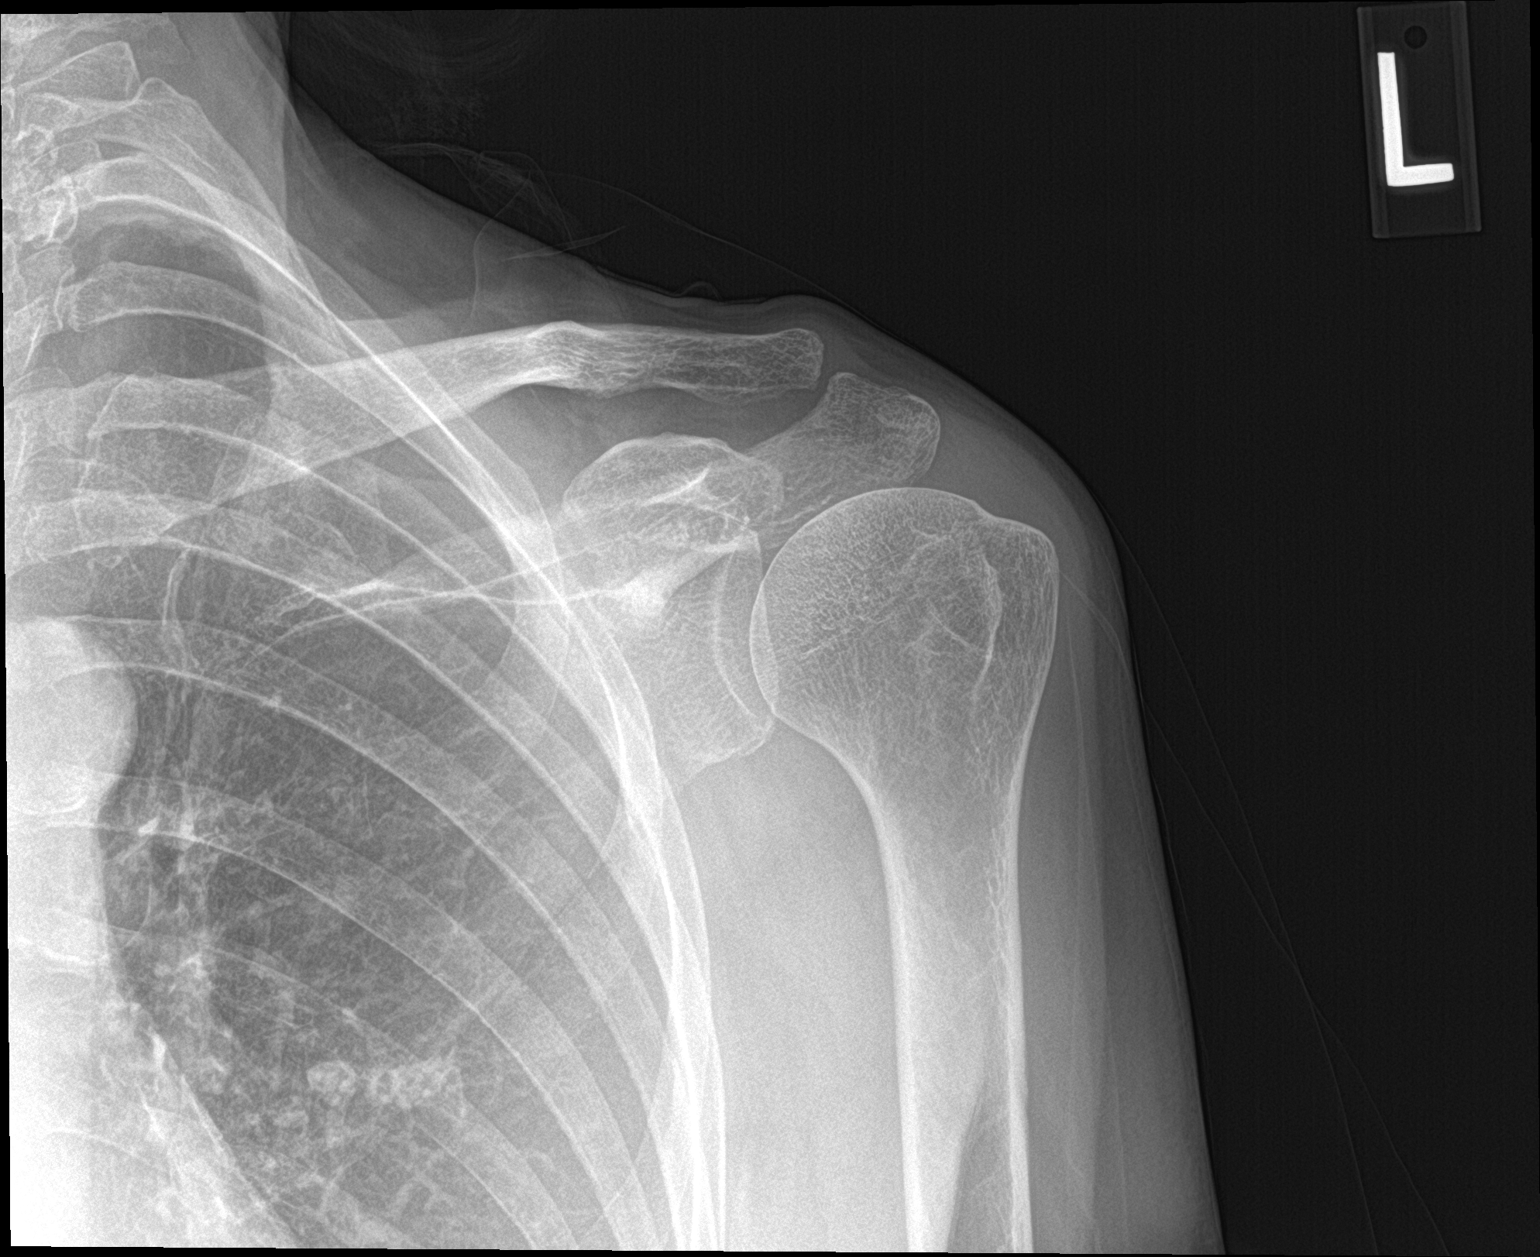

[shoulder y view]
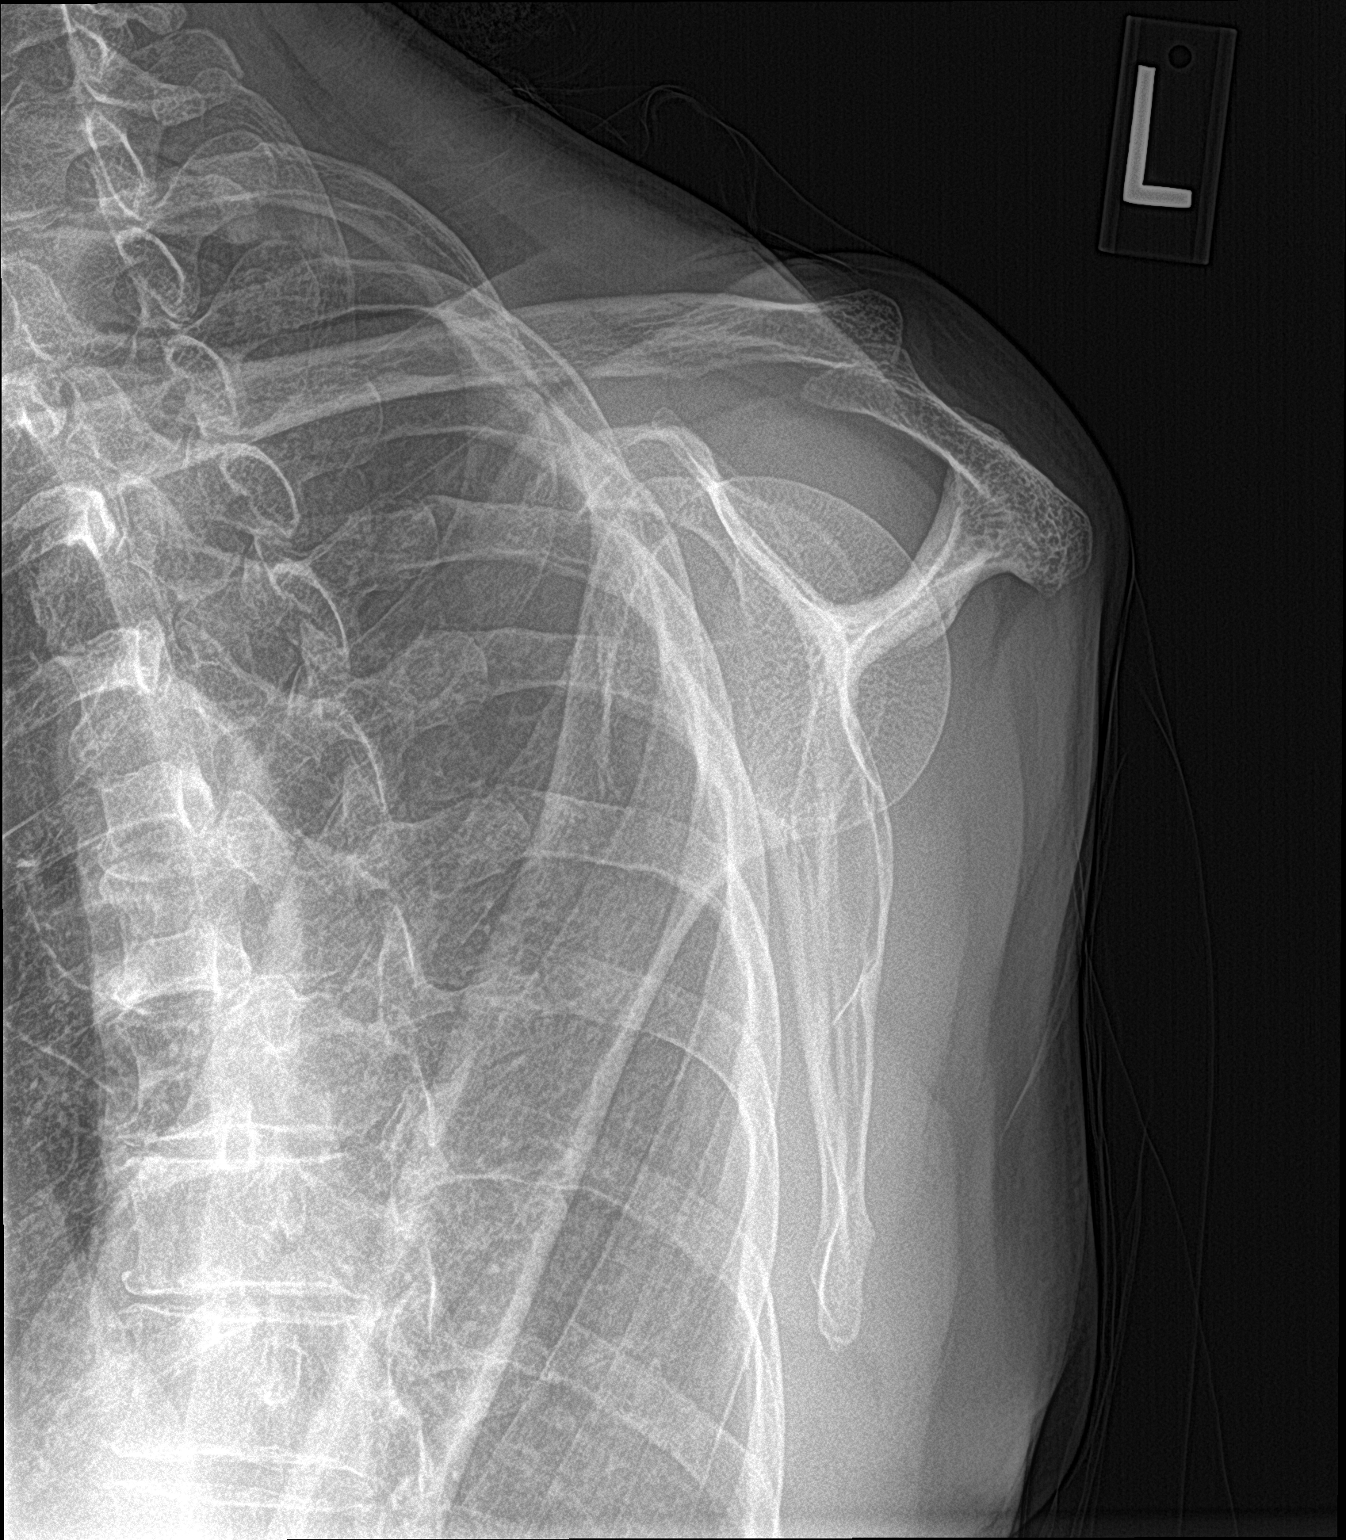

[shoulder axillary]
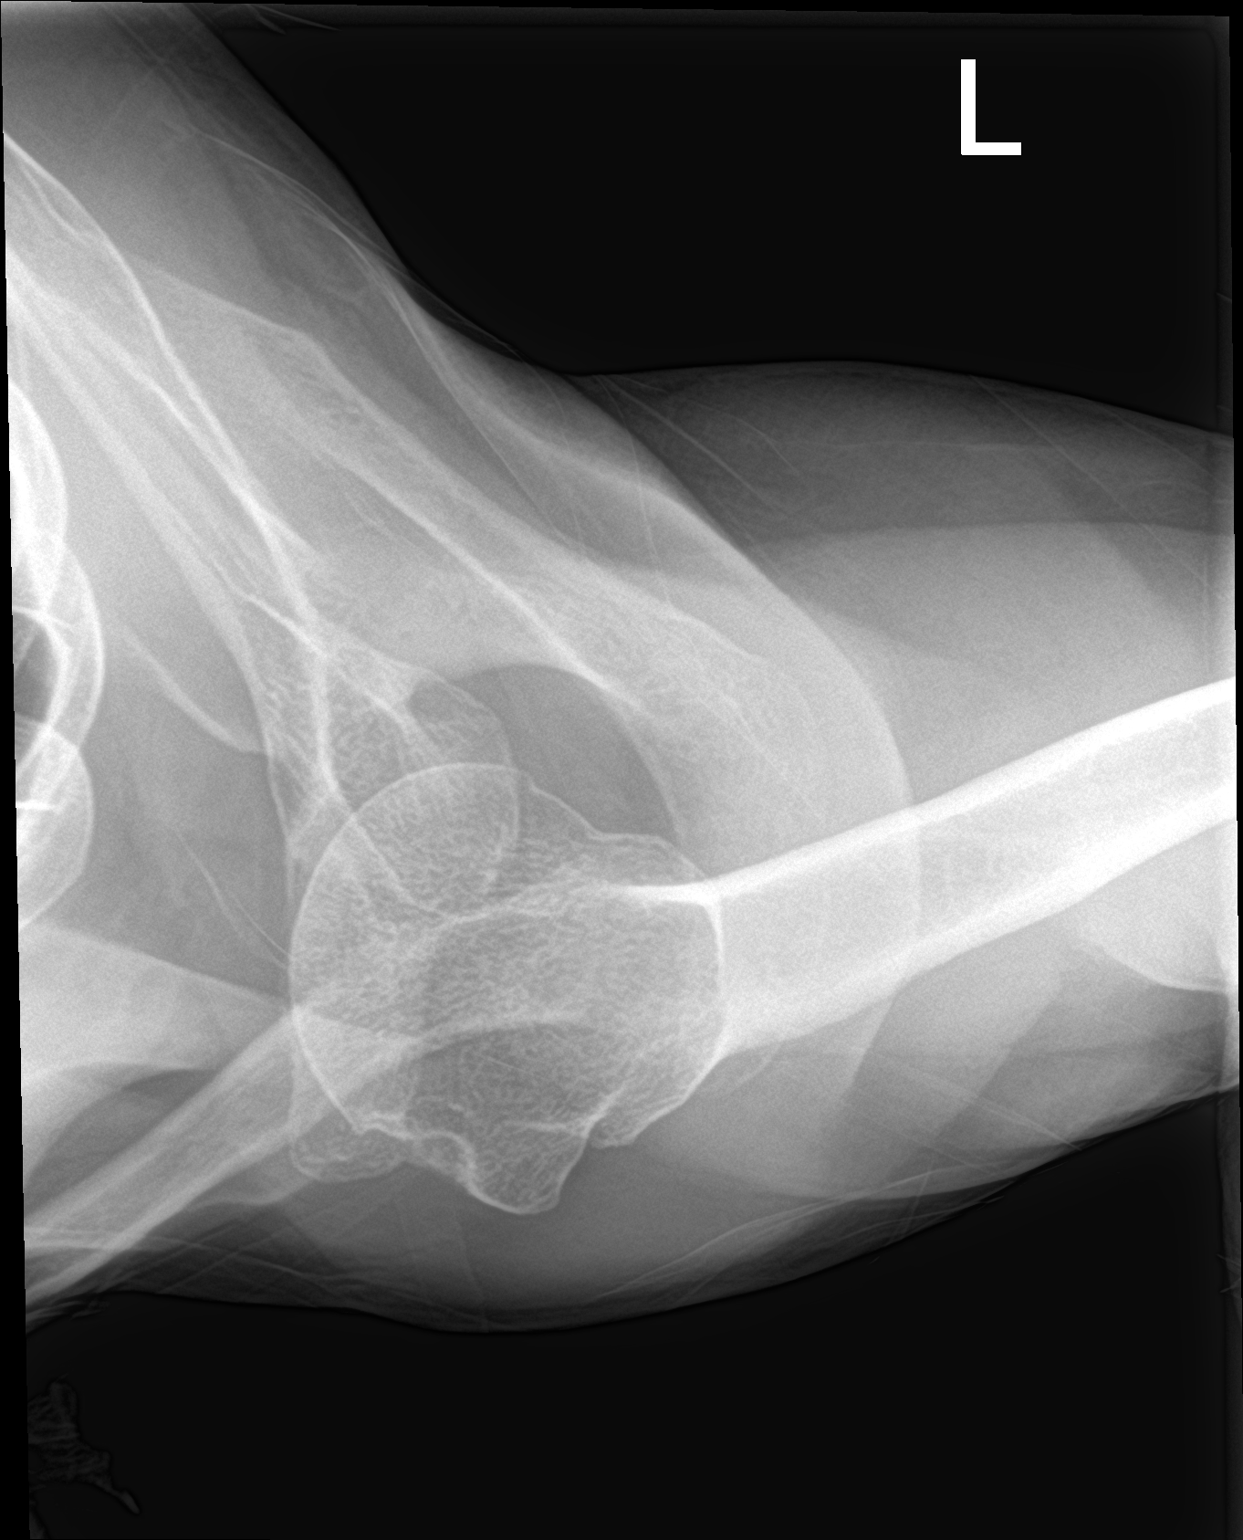

[3 of 3 positions shown; findings below may reference images not displayed]

FINDINGS: There is no evidence of fracture or dislocation. There is no
evidence of arthropathy or other focal bone abnormality. Soft
tissues are unremarkable.
IMPRESSION: Negative.

## 2019-05-24 IMAGING — DX DG KNEE COMPLETE 4+V*L*
4 series · 4 of 4 positions shown · non-contrast
Comparison: None.

CLINICAL DATA: Status post fall.

EXAM:
LEFT KNEE - COMPLETE 4+ VIEW

[knee ap]
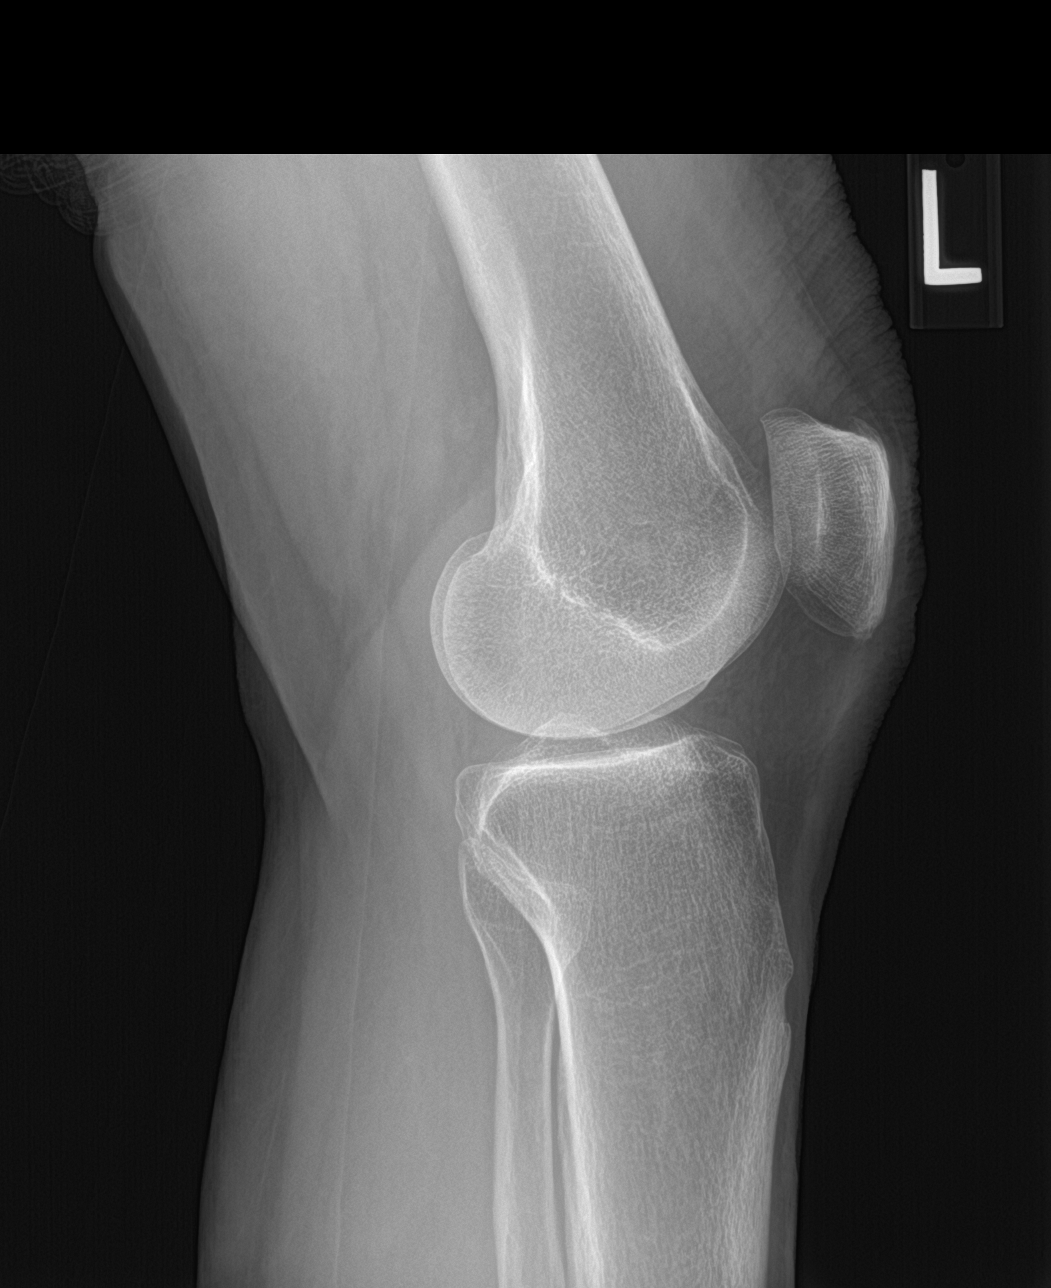

[tunnel]
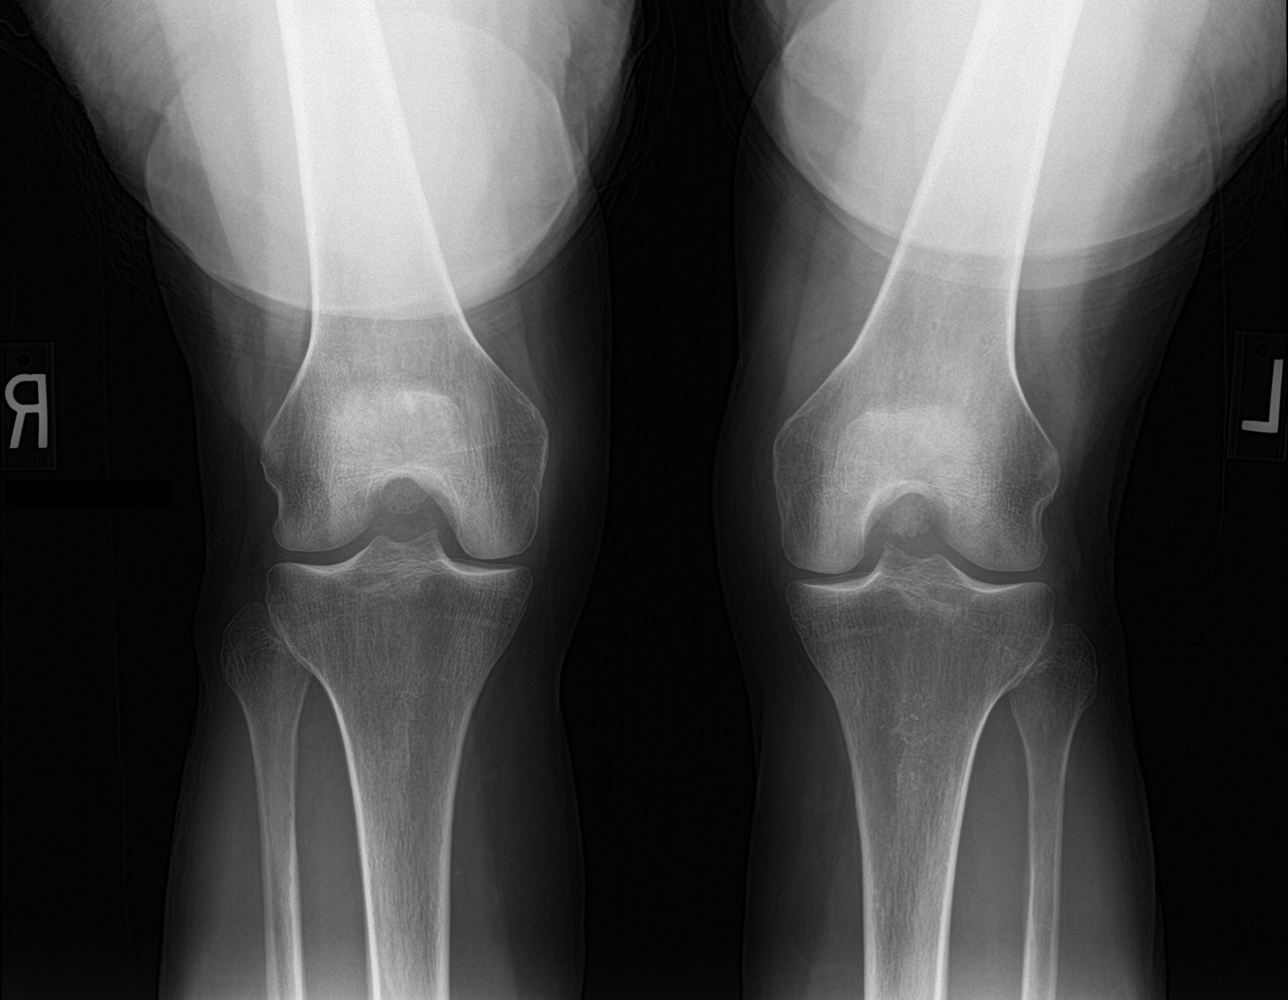

[knee sunrise]
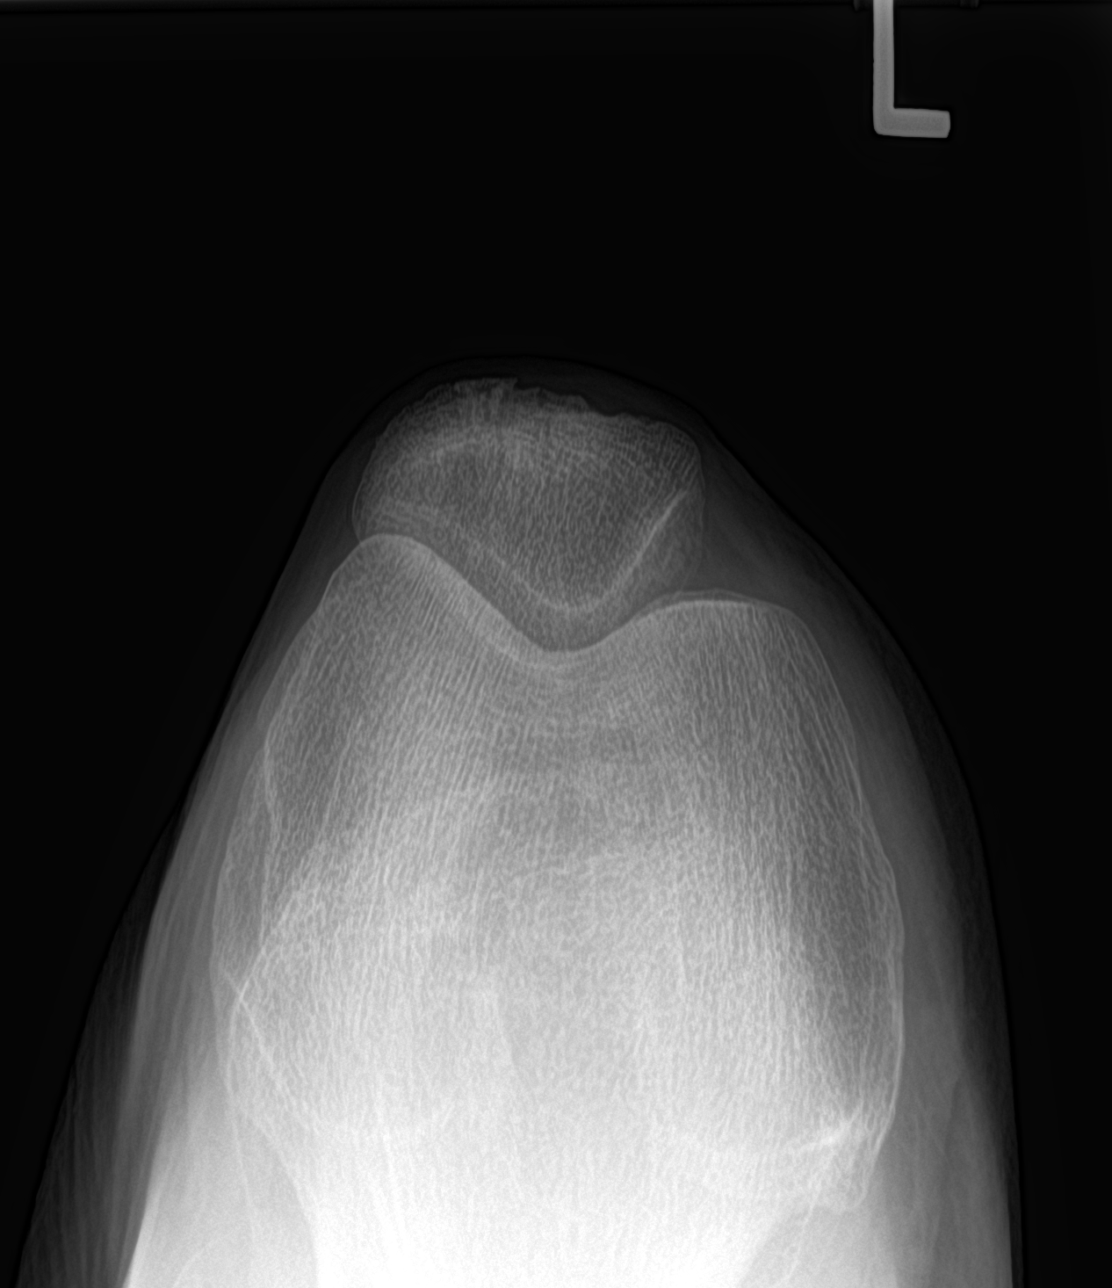

[knee ap bilat standing]
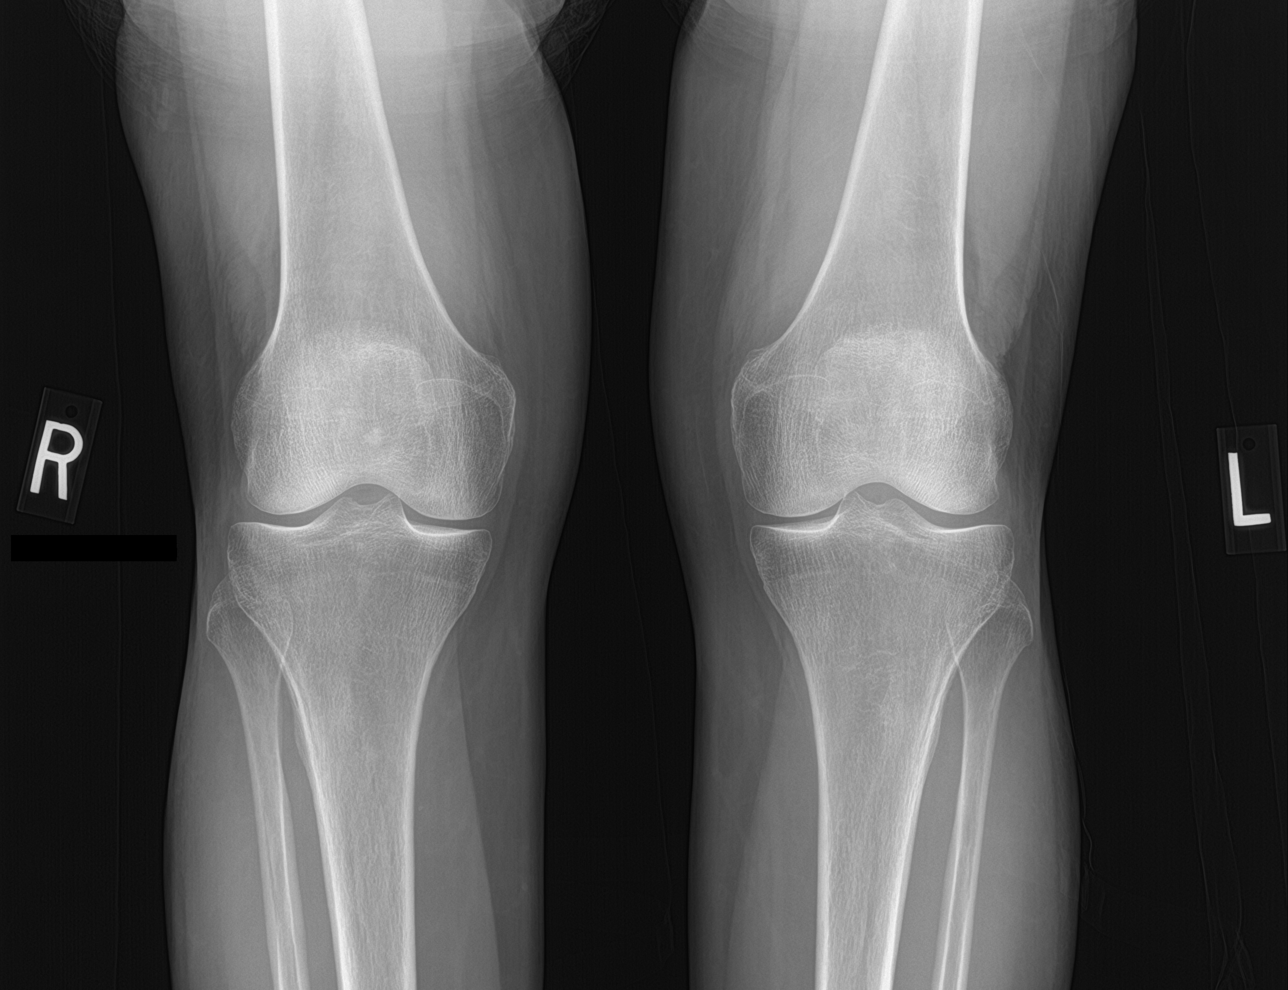

[4 of 4 positions shown; findings below may reference images not displayed]

FINDINGS: Normal medial femorotibial compartment. Normal lateral femorotibial
compartment. Normal patellofemoral articulation.
Normal visualized distal left femur. Normal visualized proximal left
tibia and left fibula. Normal proximal tibiofibular articulation.

Soft tissue structures are unremarkable.
IMPRESSION: Negative left knee.

## 2019-05-24 IMAGING — DX DG CERVICAL SPINE COMPLETE 4+V
6 series · 6 of 6 positions shown · non-contrast
Comparison: [DATE]

CLINICAL DATA: Status post trauma.

EXAM:
CERVICAL SPINE - COMPLETE 4+ VIEW

[c-spine lat]
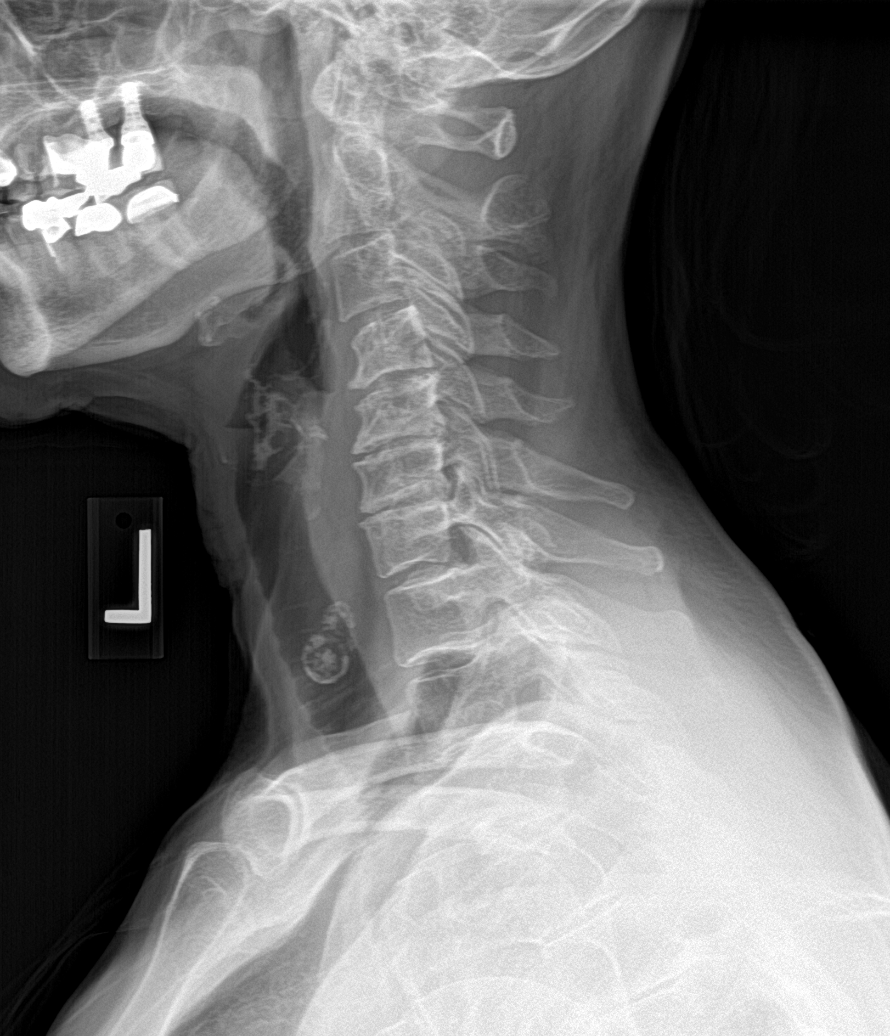

[c-spine obl (1 of 2)]
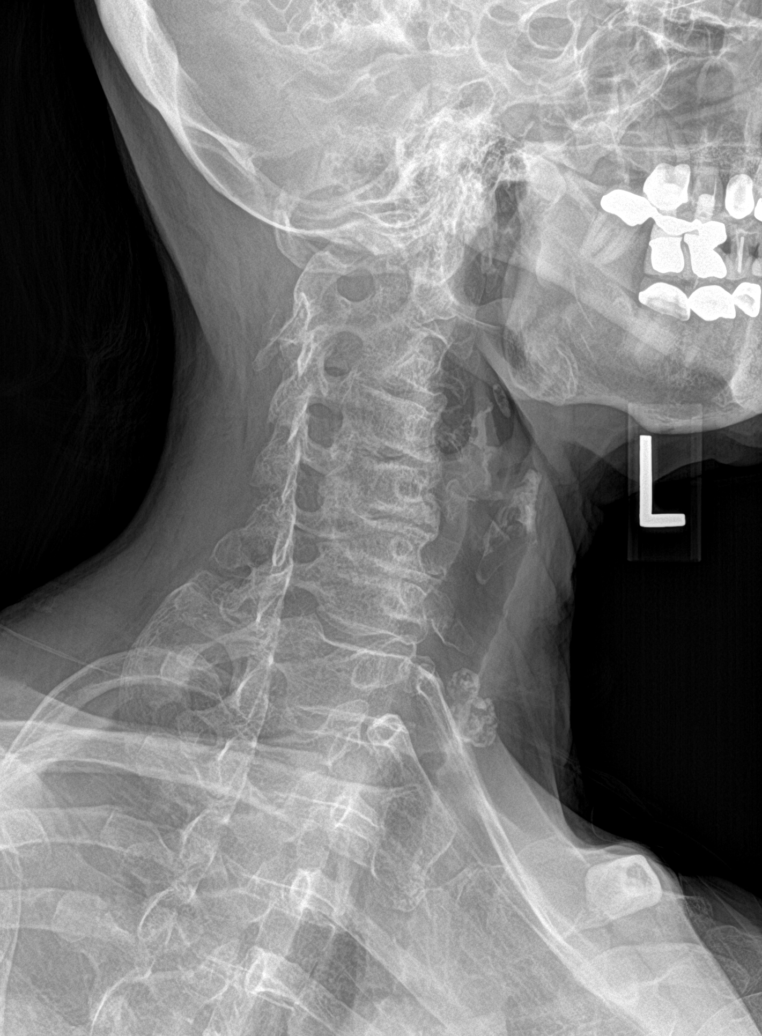

[c-spine obl (2 of 2)]
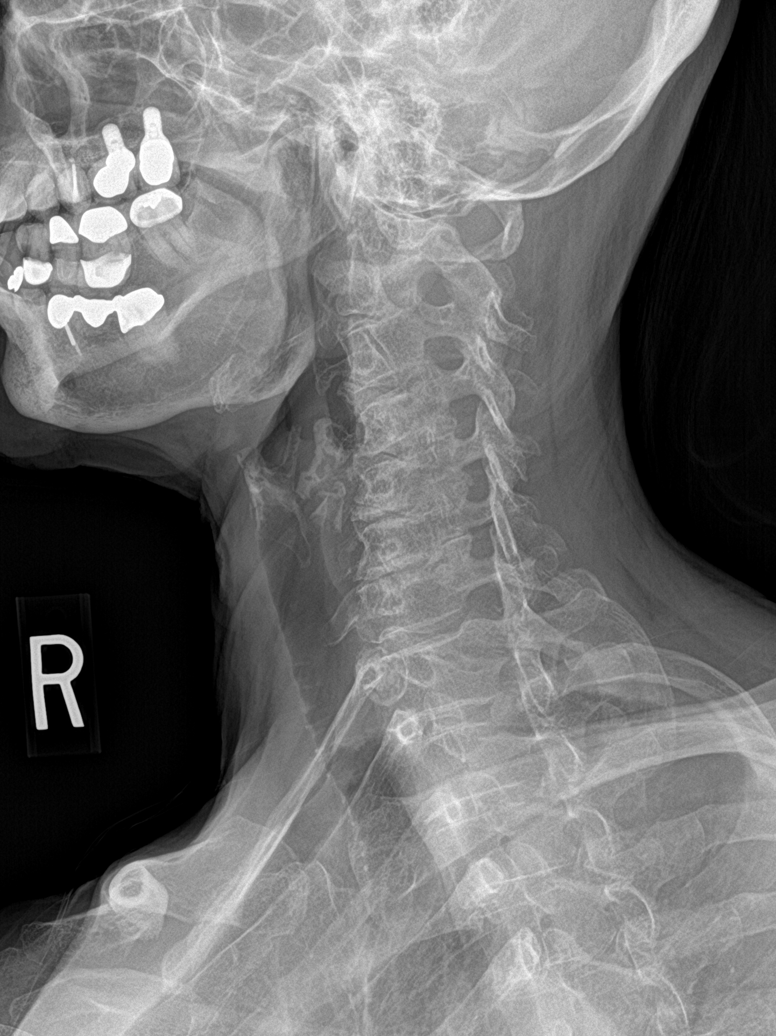

[c-spine ap]
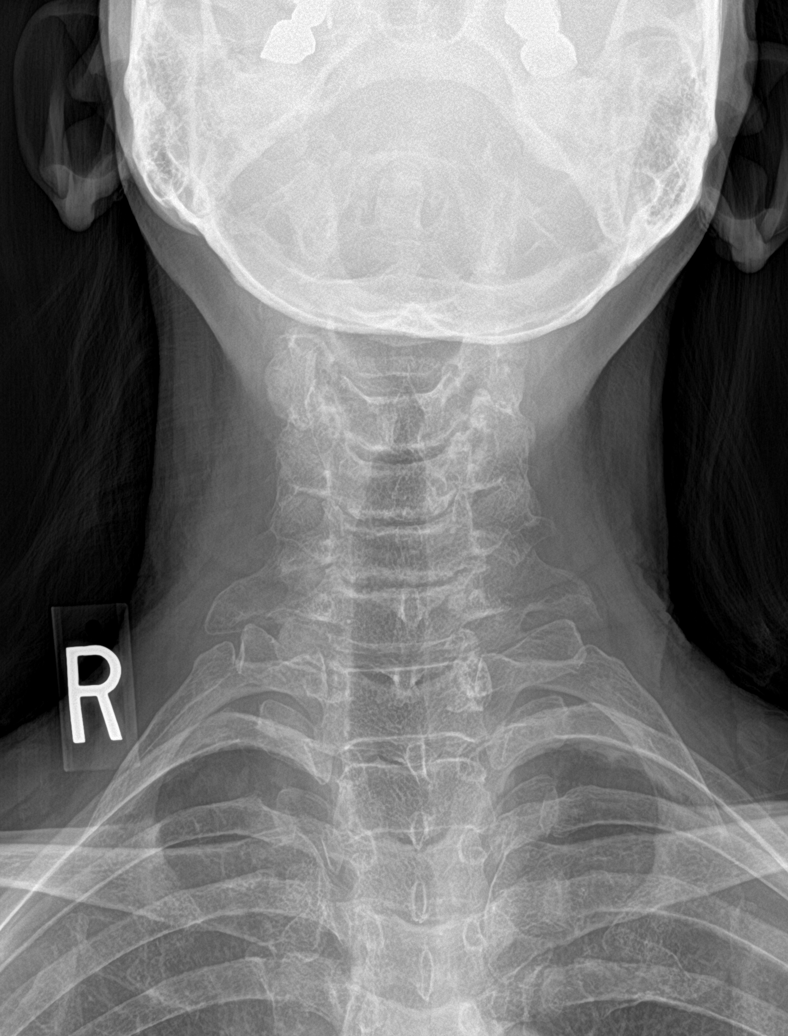

[c-spine open mouth]
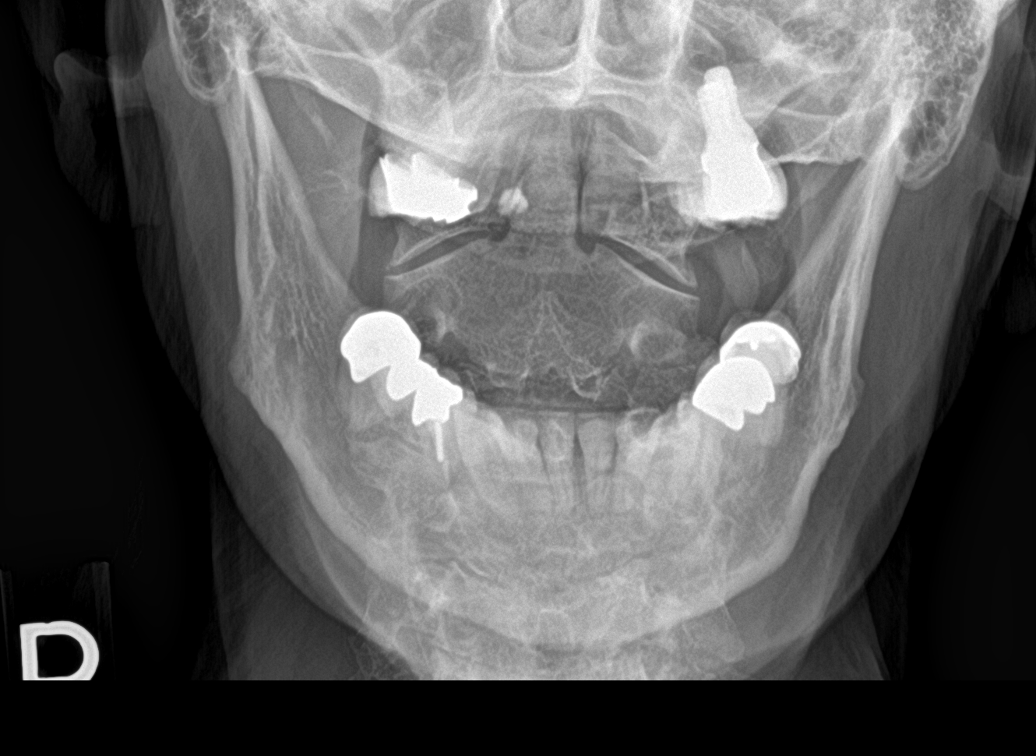

[c-spine swimmers]
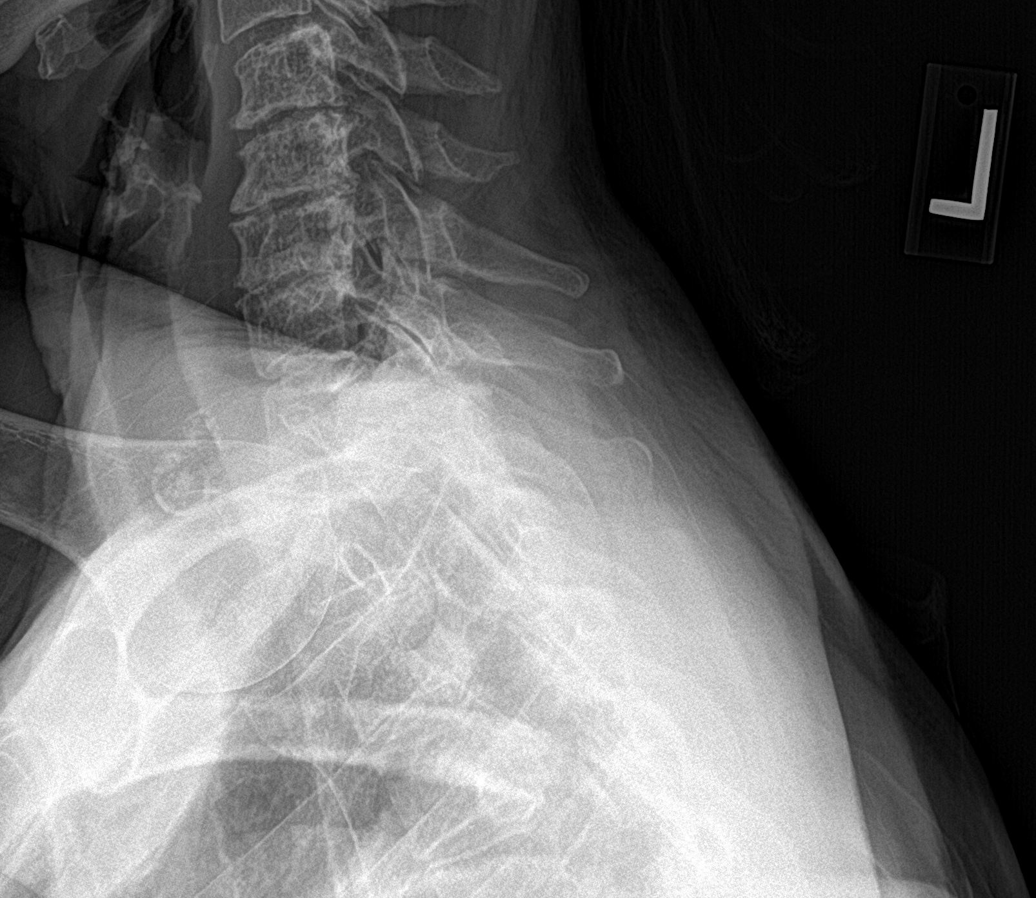

[6 of 6 positions shown; findings below may reference images not displayed]

FINDINGS: There is no evidence of cervical spine fracture or prevertebral soft
tissue swelling. Alignment is normal. Moderate severity endplate
sclerosis is seen at the levels of C4-C5, C5-C6 and C6-C7. Mild to
moderate severity intervertebral disc space narrowing is also seen
at these levels.
IMPRESSION: 1. Stable multilevel degenerative changes, without evidence of acute
osseous abnormality.

## 2019-05-24 IMAGING — DX DG WRIST COMPLETE 3+V*L*
4 series · 4 of 4 positions shown · non-contrast
Comparison: None.

CLINICAL DATA: Status post fall.

EXAM:
LEFT WRIST - COMPLETE 3+ VIEW

[wrist pa]
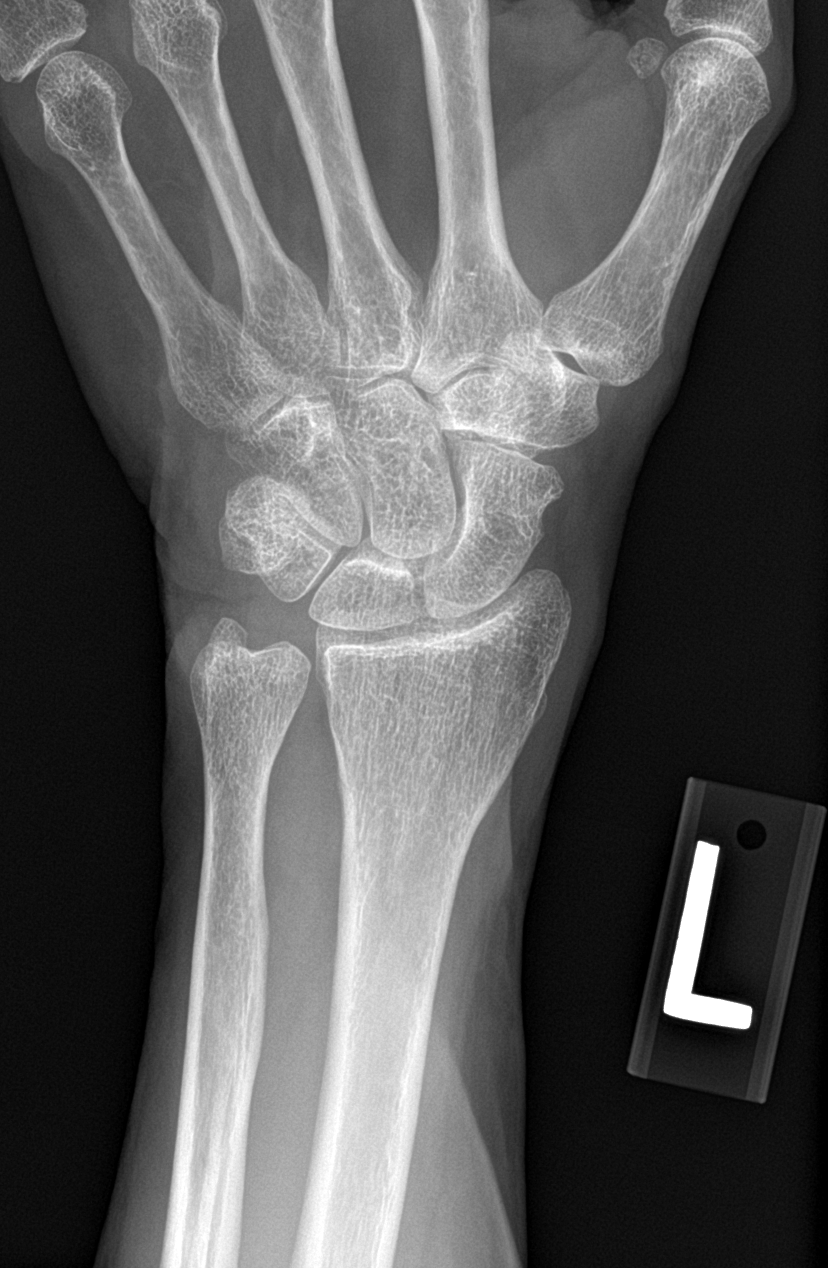

[wrist obl]
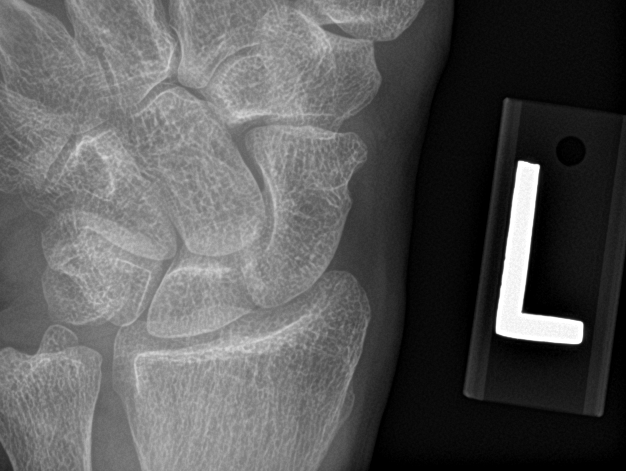

[wrist lat]
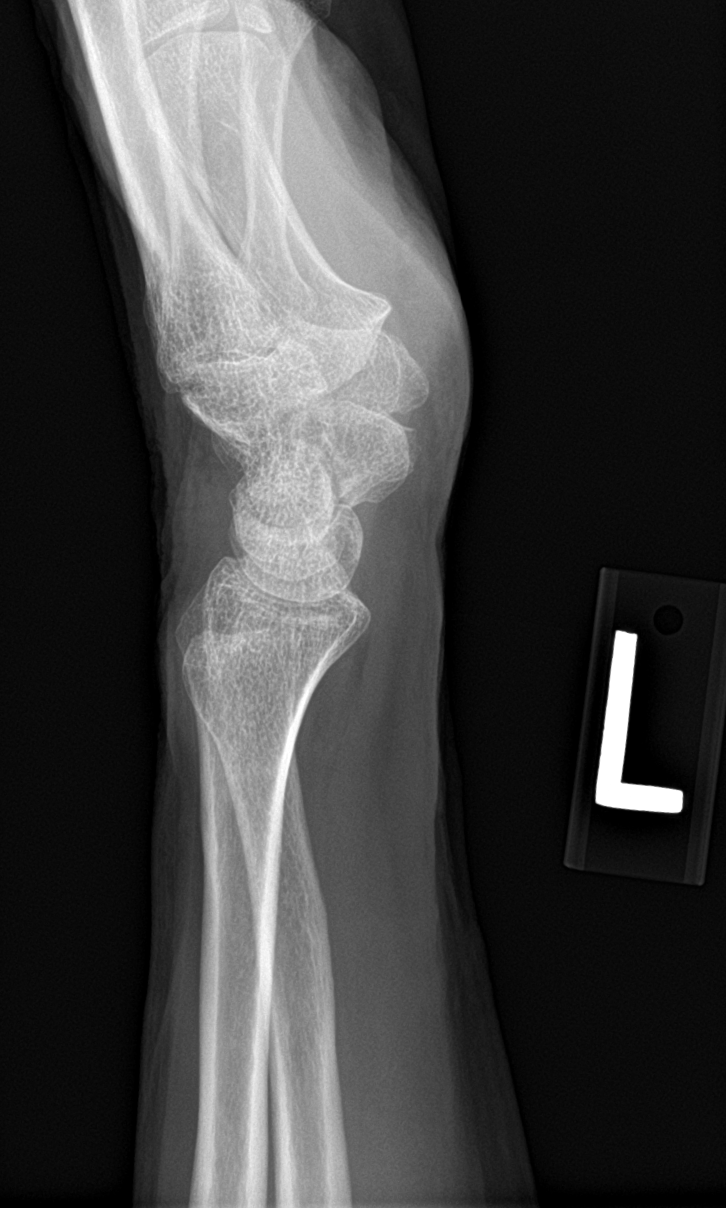

[wrist navicular]
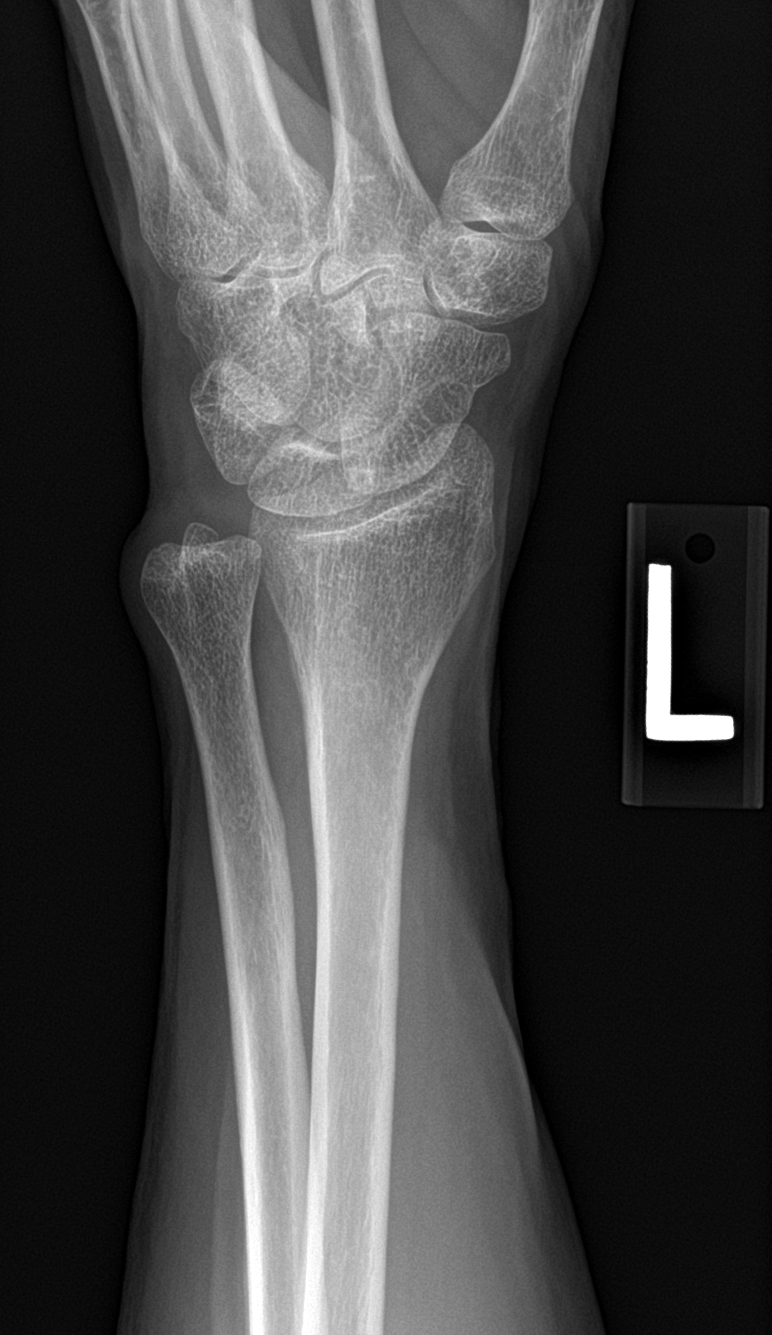

[4 of 4 positions shown; findings below may reference images not displayed]

FINDINGS: Normal visualized distal radius and ulna. Normal distal radioulnar
articulation. Normal radiocarpal articulation.
Normal carpal bones. Normal carpal articulations.
Normal carpometacarpal articulation of the left thumb. Normal second
through fifth carpometacarpal articulations.
Normal visualized metacarpal bones.
IMPRESSION: Normal left wrist radiographs.

## 2019-05-24 IMAGING — DX DG KNEE 1-2V*R*
4 series · 4 of 4 positions shown · non-contrast
Comparison: None.

CLINICAL DATA: Status post fall.

EXAM:
RIGHT KNEE - 1-2 VIEW

[knee ap]
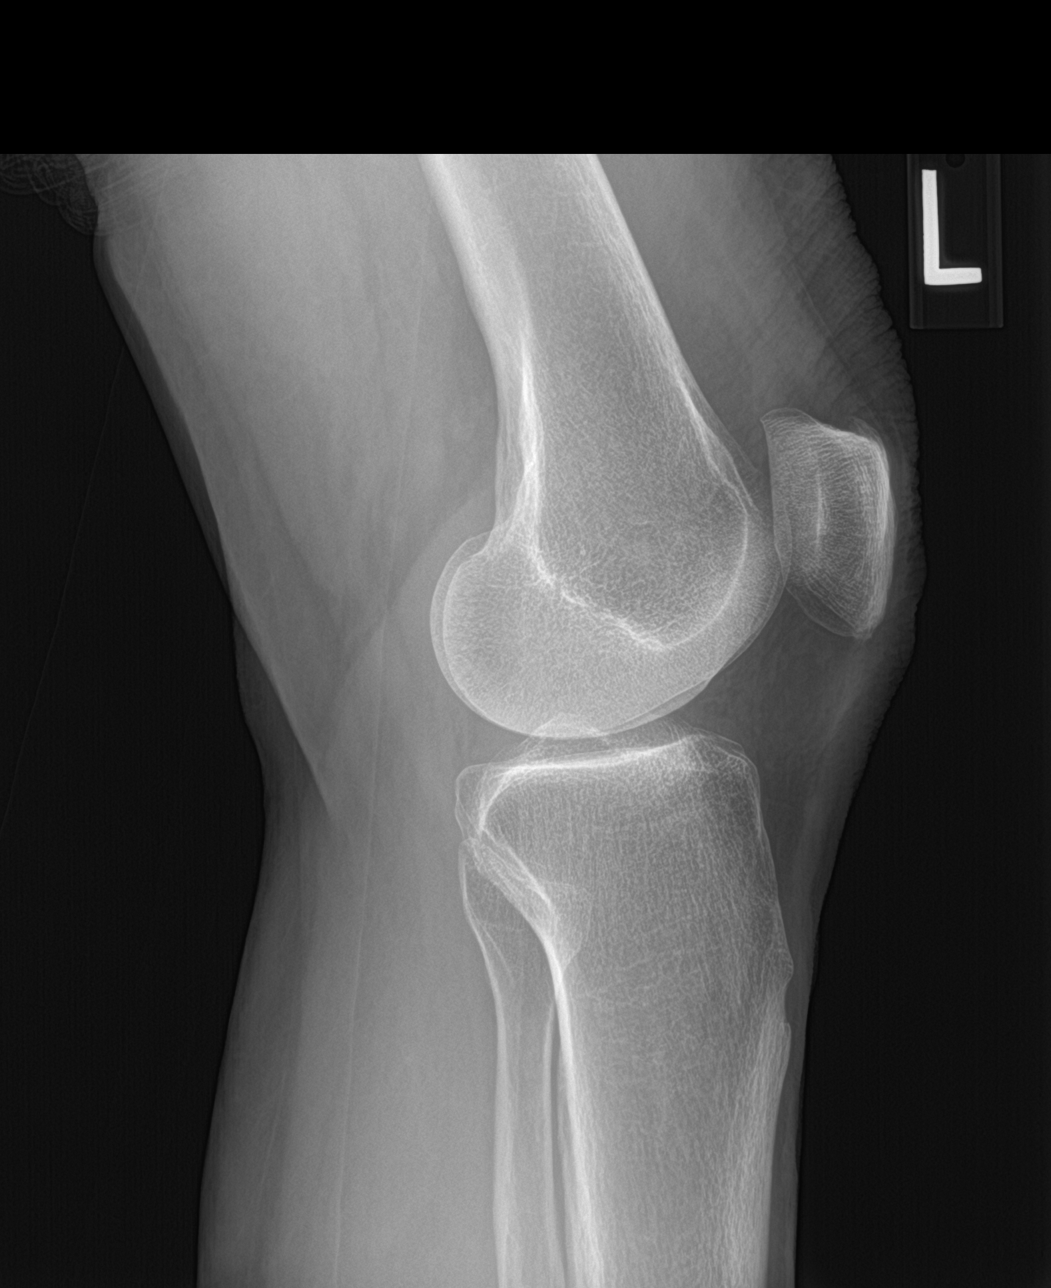

[tunnel]
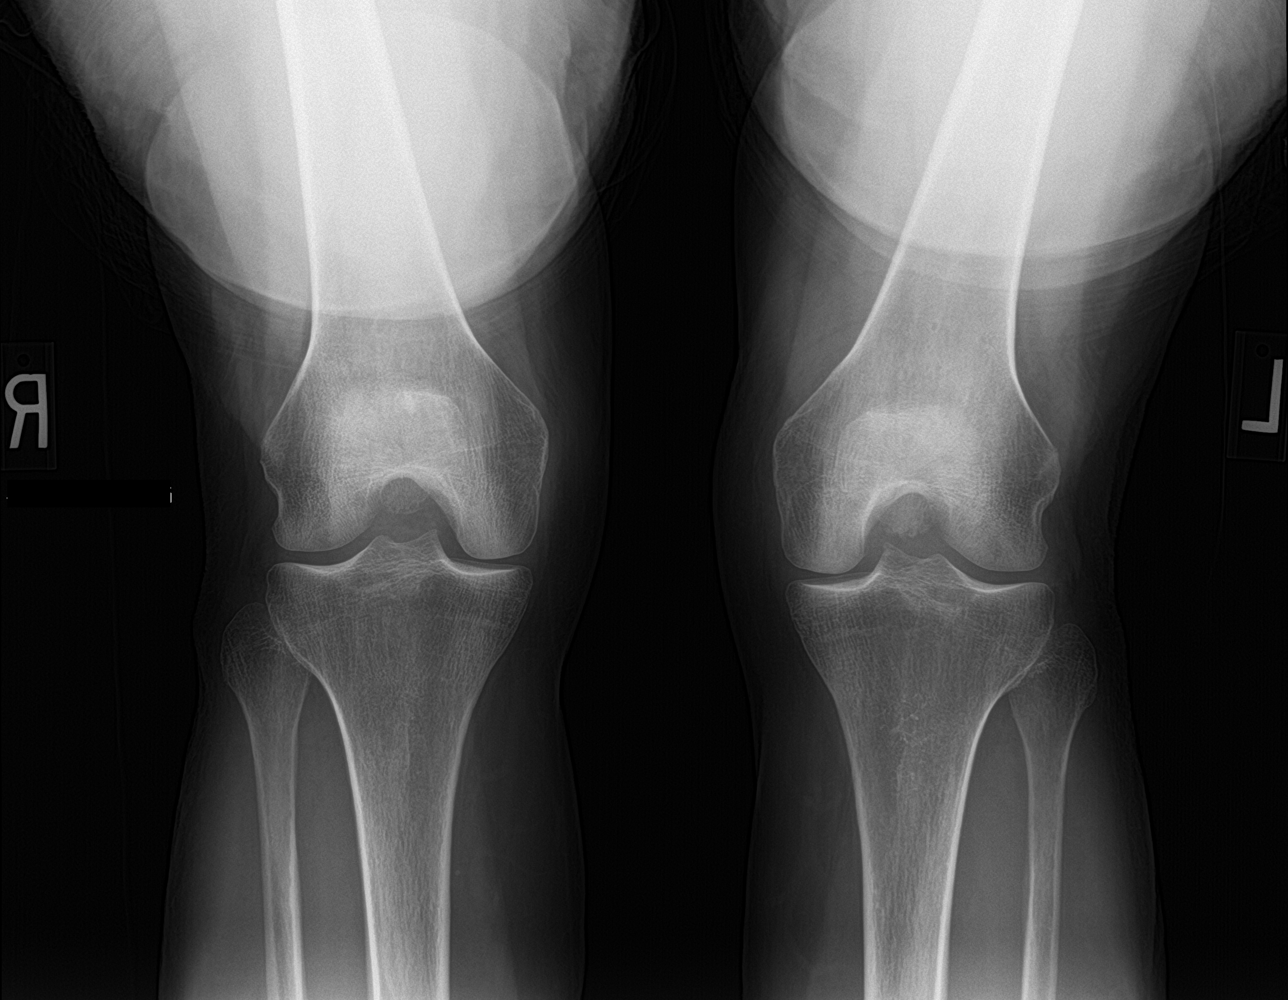

[knee sunrise]
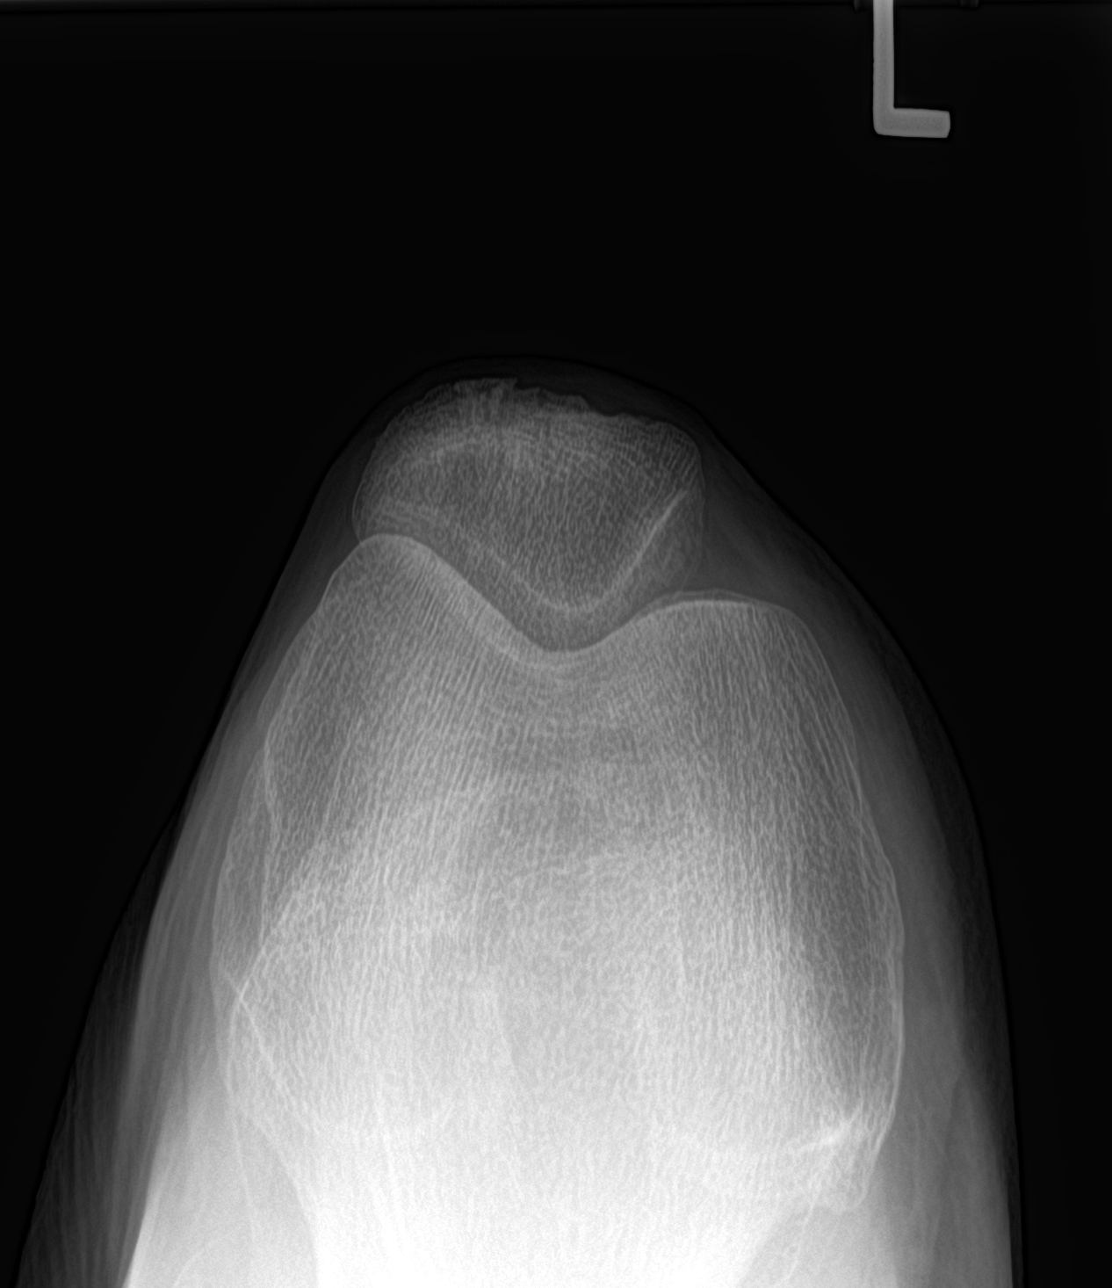

[knee ap bilat standing]
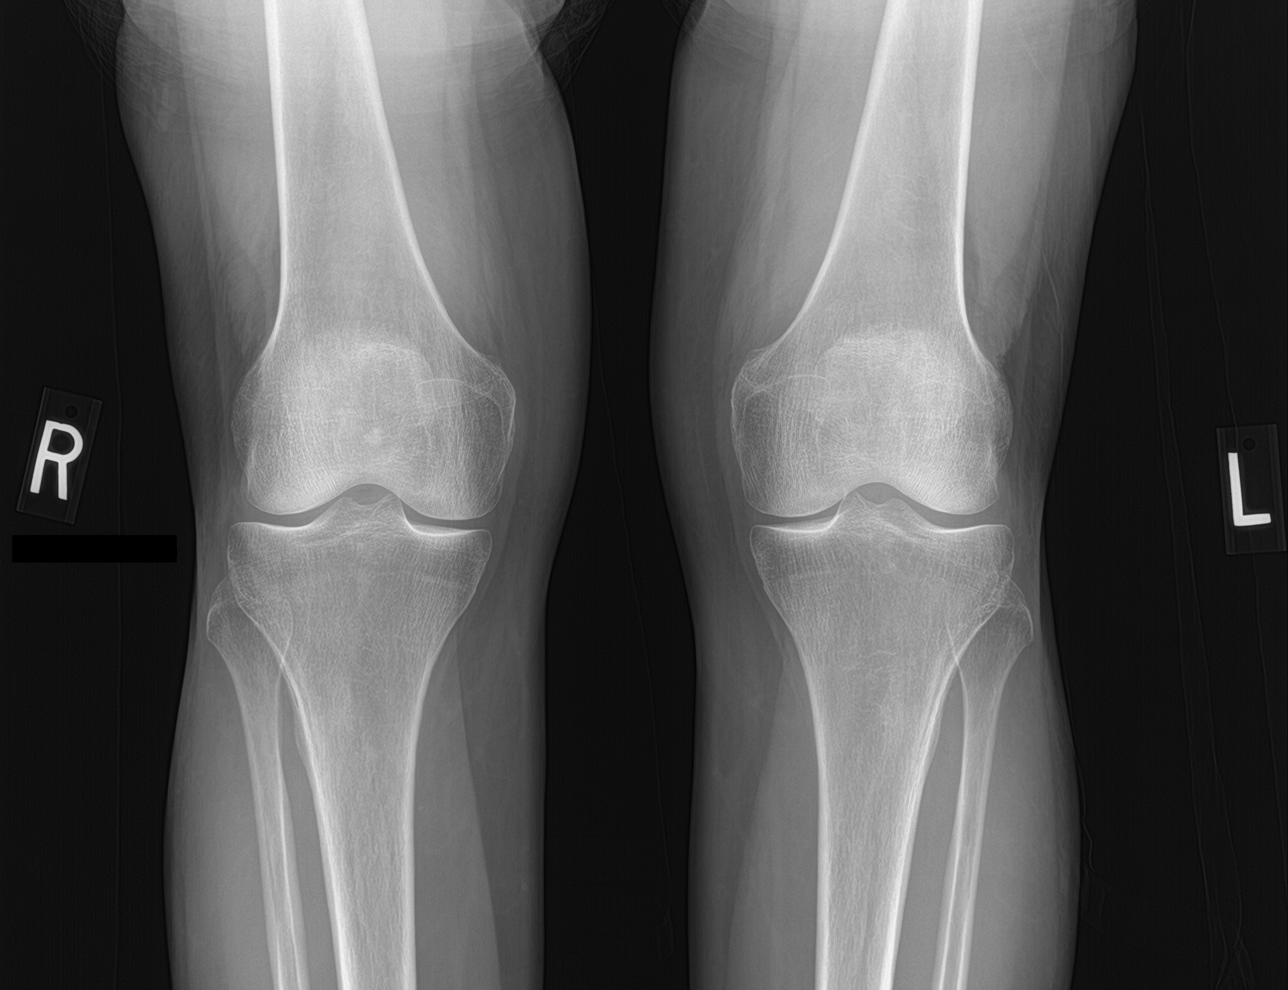

[4 of 4 positions shown; findings below may reference images not displayed]

FINDINGS: Normal medial femorotibial compartment. Normal lateral femorotibial
compartment. Normal patellofemoral articulation.
Normal visualized distal right femur. Normal visualized proximal
right tibia and right fibula. Normal proximal tibiofibular
articulation.

Soft tissue structures are unremarkable.
IMPRESSION: Negative right knee.

## 2019-05-24 MED ORDER — PREDNISONE 50 MG PO TABS: ORAL_TABLET | ORAL | 0 refills | Status: DC

## 2019-05-24 NOTE — Progress Notes (Signed)
    Procedures performed today:    None.  Independent interpretation of tests performed by another provider:   None.  Impression and Recommendations:    Kristen Jacobs had a fall last month, she has pain in her neck, left shoulder, left knee, left wrist. Exams are unremarkable with the exception of a positive speeds test on the left shoulder, and unremarkable knee exam, and tenderness over the left pisiform. We are going to start conservatively, burst of prednisone, as well as x-rays of all of the above structures. I do think she has a left biceps tendinitis, cervical strain, contusion of her left wrist, as well as exacerbation of underlying left knee osteoarthritis. At her return visit we can further evaluate the structure individually.  Of note her sister with stage IV pancreatic cancer is doing well.    ___________________________________________ Gwen Her. Dianah Field, M.D., ABFM., CAQSM. Primary Care and [place] Instructor of [place] of [facility] of Medicine

## 2019-05-24 NOTE — Assessment & Plan Note (Addendum)
Britzy had a fall last month, she has pain in her neck, left shoulder, left knee, left wrist. Exams are unremarkable with the exception of a positive speeds test on the left shoulder, and unremarkable knee exam, and tenderness over the left pisiform. We are going to start conservatively, burst of prednisone, as well as x-rays of all of the above structures. I do think she has a left biceps tendinitis, cervical strain, contusion of her left wrist, as well as exacerbation of underlying left knee osteoarthritis. At her return visit we can further evaluate the structure individually.  Of note her sister with stage IV pancreatic cancer is doing well.

## 2019-06-01 DIAGNOSIS — L658 Other specified nonscarring hair loss: Secondary | ICD-10-CM | POA: Diagnosis not present

## 2019-06-01 DIAGNOSIS — Z23 Encounter for immunization: Secondary | ICD-10-CM | POA: Diagnosis not present

## 2019-06-21 ENCOUNTER — Encounter: Payer: Self-pay | Admitting: Sports Medicine

## 2019-06-21 ENCOUNTER — Ambulatory Visit (INDEPENDENT_AMBULATORY_CARE_PROVIDER_SITE_OTHER): Payer: Medicare Other | Admitting: Sports Medicine

## 2019-06-21 DIAGNOSIS — M25532 Pain in left wrist: Secondary | ICD-10-CM

## 2019-06-21 DIAGNOSIS — W19XXXD Unspecified fall, subsequent encounter: Secondary | ICD-10-CM | POA: Diagnosis not present

## 2019-06-21 NOTE — Assessment & Plan Note (Signed)
Kristen Jacobs returns, she had a fall back in December. She had pain in her neck, left shoulder, left knee, left wrist. Her exam was for the most part unremarkable. We added a burst of prednisone, x-rays, and she did much better. 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 This structure was injected today with ultrasound guidance. She does have some pain over her deltoid and worse with abduction consistent with impingement as well as pain at the medial joint line of the knee consistent with OA, these structures will be addressed at future visits, return in 1 month.

## 2019-06-21 NOTE — Progress Notes (Signed)
    Procedures performed today:    Procedure: Real-time Ultrasound Guided injection of the left triangular fibrocartilaginous complex Device: Samsung HS60  Verbal informed consent obtained.  Time-out conducted.  Noted no overlying erythema, induration, or other signs of local infection.  Skin prepped in a sterile fashion.  Local anesthesia: Topical Ethyl chloride.  With sterile technique and under real time ultrasound guidance:  1 cc lidocaine, 1 cc Kenalog 40 injected easily completed without difficulty  Pain immediately resolved suggesting accurate placement of the medication.  Advised to call if fevers/chills, erythema, induration, drainage, or persistent bleeding.  Images permanently stored and available for review in the ultrasound unit.  Impression: Technically successful ultrasound guided injection.  Independent interpretation of tests performed by another provider:   None.  Impression and Recommendations:    Kristen Jacobs returns, she had a fall back in December. She had pain in her neck, left shoulder, left knee, left wrist. Her exam was for the most part unremarkable. We added a burst of prednisone, x-rays, and she did much better. 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 This structure was injected today with ultrasound guidance. She does have some pain over her deltoid and worse with abduction consistent with impingement as well as pain at the medial joint line of the knee consistent with OA, these structures will be addressed at future visits, return in 1 month.    ___________________________________________ Gwen Her. Dianah Field, M.D., ABFM., CAQSM. Primary Care and [place] Instructor of [place] of [facility] of Medicine

## 2019-07-19 ENCOUNTER — Ambulatory Visit (INDEPENDENT_AMBULATORY_CARE_PROVIDER_SITE_OTHER): Payer: Medicare Other | Admitting: Sports Medicine

## 2019-07-19 ENCOUNTER — Other Ambulatory Visit: Payer: Self-pay

## 2019-07-19 ENCOUNTER — Encounter: Payer: Self-pay | Admitting: Sports Medicine

## 2019-07-19 ENCOUNTER — Ambulatory Visit: Payer: Medicare Other | Admitting: Sports Medicine

## 2019-07-19 DIAGNOSIS — M7552 Bursitis of left shoulder: Secondary | ICD-10-CM | POA: Diagnosis not present

## 2019-07-19 DIAGNOSIS — M1712 Unilateral primary osteoarthritis, left knee: Secondary | ICD-10-CM | POA: Insufficient documentation

## 2019-07-19 DIAGNOSIS — M25532 Pain in left wrist: Secondary | ICD-10-CM

## 2019-07-19 HISTORY — DX: Bursitis of left shoulder: M75.52

## 2019-07-19 NOTE — Assessment & Plan Note (Signed)
As below, desires a nonpharmacologic approach so we will have her do rehab exercises for 6 weeks, if no better we can proceed with a subacromial injection.

## 2019-07-19 NOTE — Assessment & Plan Note (Signed)
Kristen Jacobs has gelling with medial joint line pain. X-rays were consistent with osteoarthritis, mild. She desires a nonpharmacologic approach so we will simply use knee rehab exercises, return to see me in 4 to 6 weeks, injection if no better.

## 2019-07-19 NOTE — Progress Notes (Signed)
    Procedures performed today:    None.  Independent interpretation of notes and tests performed by another provider:   None.  Impression and Recommendations:    Primary osteoarthritis of left knee Ekam has gelling with medial joint line pain. X-rays were consistent with osteoarthritis, mild. She desires a nonpharmacologic approach so we will simply use knee rehab exercises, return to see me in 4 to 6 weeks, injection if no better.  Subacromial bursitis of left shoulder joint As below, desires a nonpharmacologic approach so we will have her do rehab exercises for 6 weeks, if no better we can proceed with a subacromial injection.  Left wrist pain Sintia returns, she had a fall that resulted in some left wrist pain consistent with a TFCC injury on exam. At the last visit I injected around her triangular fibrocartilaginous complex and she returns today pain-free with regards to her wrist.    ___________________________________________ Gwen Her. Dianah Field, M.D., ABFM., CAQSM. Primary Care and [place] Instructor of [place] of [facility] of Medicine

## 2019-07-19 NOTE — Assessment & Plan Note (Signed)
Kristen Jacobs returns, she had a fall that resulted in some left wrist pain consistent with a TFCC injury on exam. At the last visit I injected around her triangular fibrocartilaginous complex and she returns today pain-free with regards to her wrist.

## 2019-07-21 DIAGNOSIS — F411 Generalized anxiety disorder: Secondary | ICD-10-CM | POA: Diagnosis not present

## 2019-07-21 DIAGNOSIS — F5101 Primary insomnia: Secondary | ICD-10-CM | POA: Diagnosis not present

## 2019-07-21 DIAGNOSIS — Z6823 Body mass index (BMI) 23.0-23.9, adult: Secondary | ICD-10-CM | POA: Diagnosis not present

## 2019-08-30 ENCOUNTER — Ambulatory Visit: Payer: Medicare Other | Admitting: Sports Medicine

## 2019-09-05 ENCOUNTER — Other Ambulatory Visit: Payer: Self-pay

## 2019-09-05 ENCOUNTER — Encounter: Payer: Self-pay | Admitting: Sports Medicine

## 2019-09-05 ENCOUNTER — Ambulatory Visit (INDEPENDENT_AMBULATORY_CARE_PROVIDER_SITE_OTHER): Payer: Medicare Other

## 2019-09-05 ENCOUNTER — Ambulatory Visit (INDEPENDENT_AMBULATORY_CARE_PROVIDER_SITE_OTHER): Payer: Medicare Other | Admitting: Sports Medicine

## 2019-09-05 DIAGNOSIS — S99922A Unspecified injury of left foot, initial encounter: Secondary | ICD-10-CM

## 2019-09-05 DIAGNOSIS — M25572 Pain in left ankle and joints of left foot: Secondary | ICD-10-CM | POA: Diagnosis not present

## 2019-09-05 DIAGNOSIS — S92155A Nondisplaced avulsion fracture (chip fracture) of left talus, initial encounter for closed fracture: Secondary | ICD-10-CM | POA: Diagnosis not present

## 2019-09-05 DIAGNOSIS — M79672 Pain in left foot: Secondary | ICD-10-CM | POA: Diagnosis not present

## 2019-09-05 DIAGNOSIS — S92152A Displaced avulsion fracture (chip fracture) of left talus, initial encounter for closed fracture: Secondary | ICD-10-CM

## 2019-09-05 HISTORY — DX: Displaced avulsion fracture (chip fracture) of left talus, initial encounter for closed fracture: S92.152A

## 2019-09-05 IMAGING — DX DG ANKLE COMPLETE 3+V*L*
3 series · 3 of 3 positions shown · non-contrast
Comparison: No prior.

CLINICAL DATA: Pain lateral malleolus.

EXAM:
LEFT ANKLE COMPLETE - 3+ VIEW

[ankle ap]
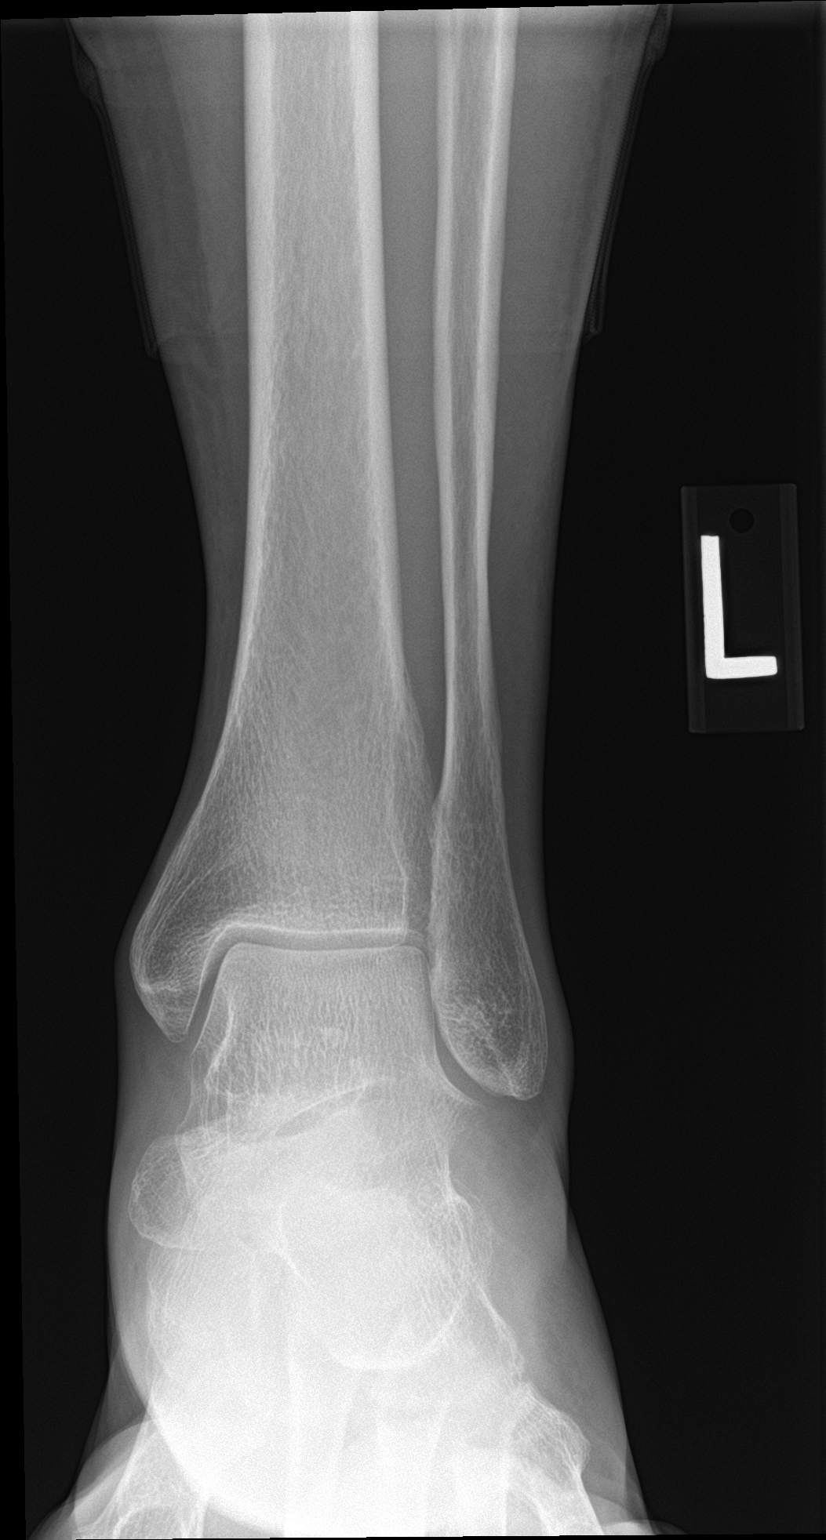

[ankle obl]
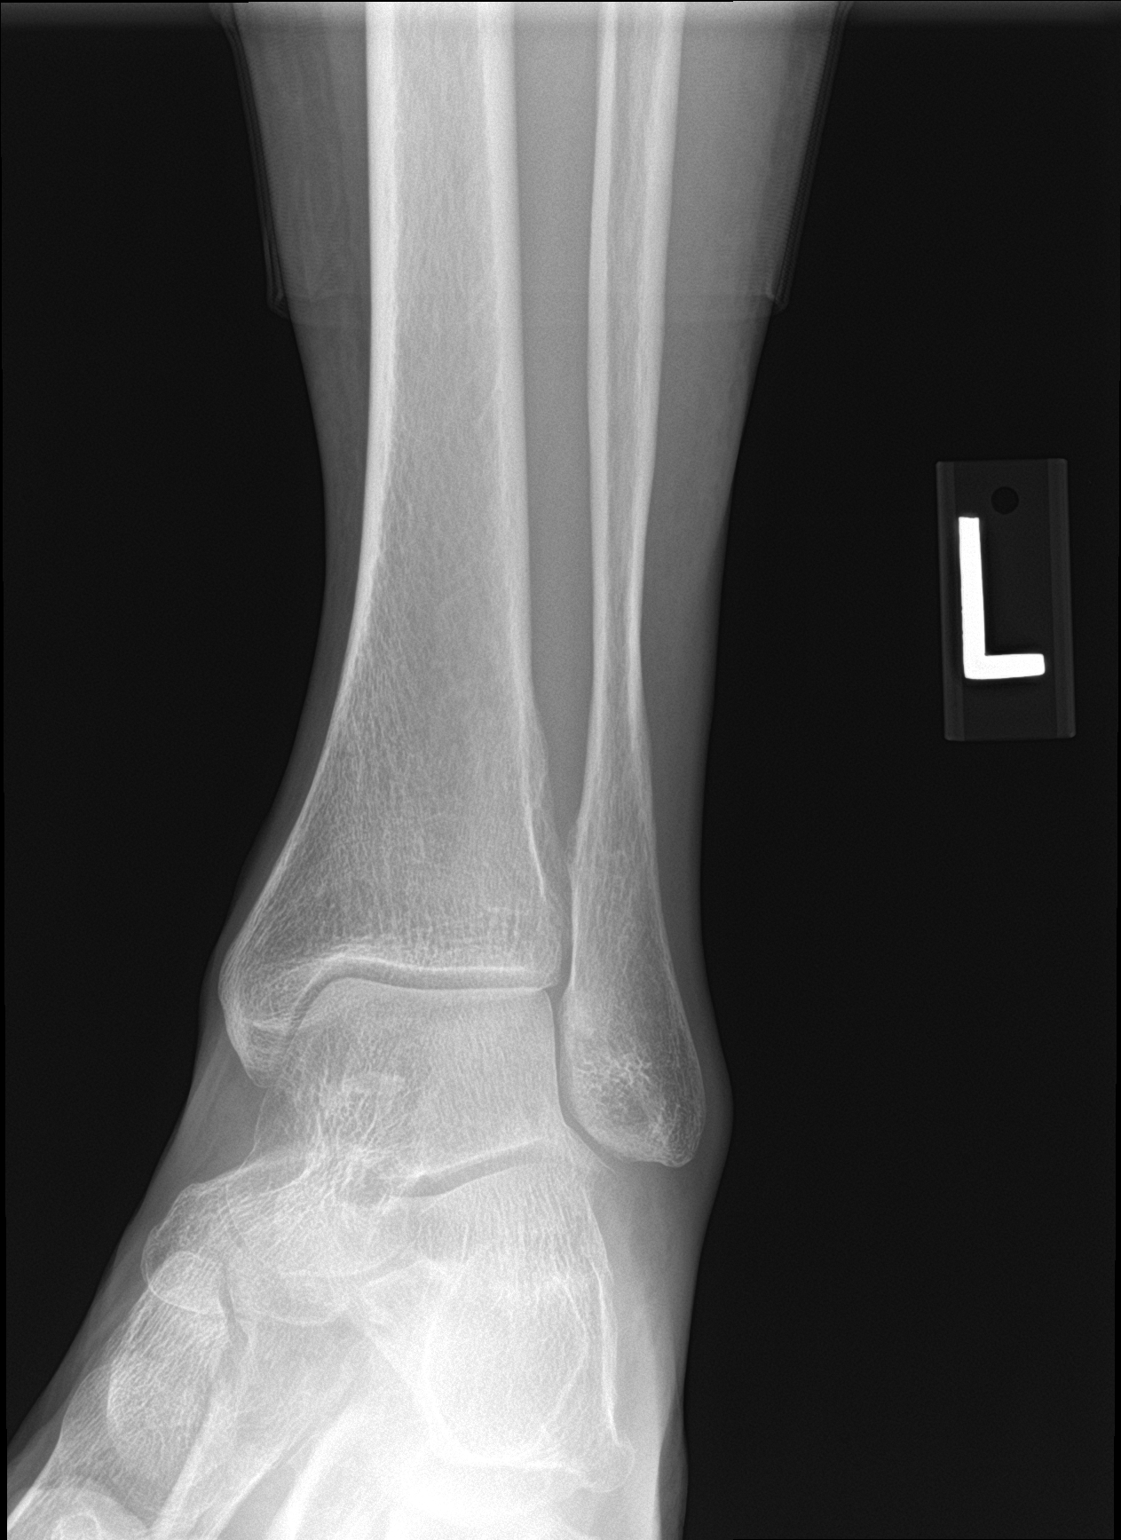

[ankle lat]
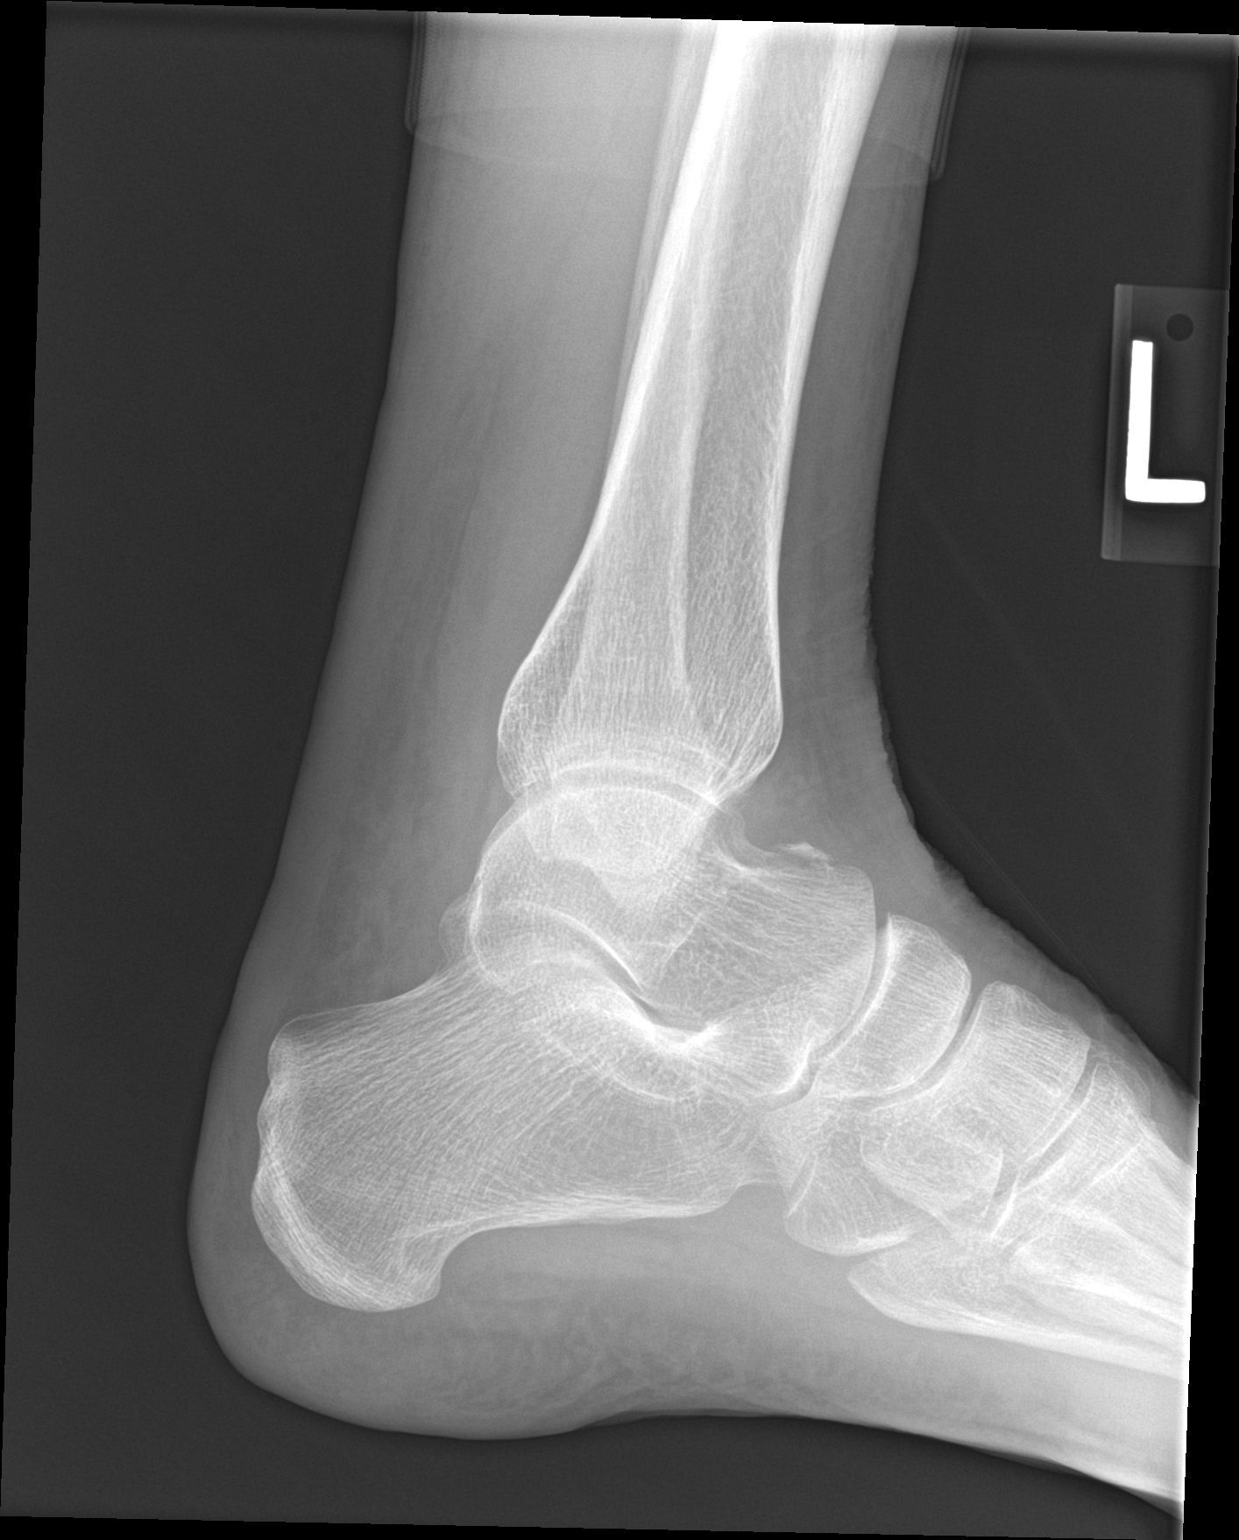

[3 of 3 positions shown; findings below may reference images not displayed]

FINDINGS: Tiny bony density noted along the posterior aspect of the talus.
This may represent a small fracture fragment, age undetermined. No
other focal abnormality identified. Malleoli are intact. No
radiopaque foreign body.
IMPRESSION: Tiny bony density noted along the posterior aspect of the talus.
This may represent a fracture fragment, age undetermined. No other
focal abnormality identified.

## 2019-09-05 IMAGING — DX DG FOOT COMPLETE 3+V*L*
3 series · 3 of 3 positions shown · non-contrast
Comparison: No prior.

CLINICAL DATA: Pain over midfoot.

EXAM:
LEFT FOOT - COMPLETE 3+ VIEW

[foot ap]
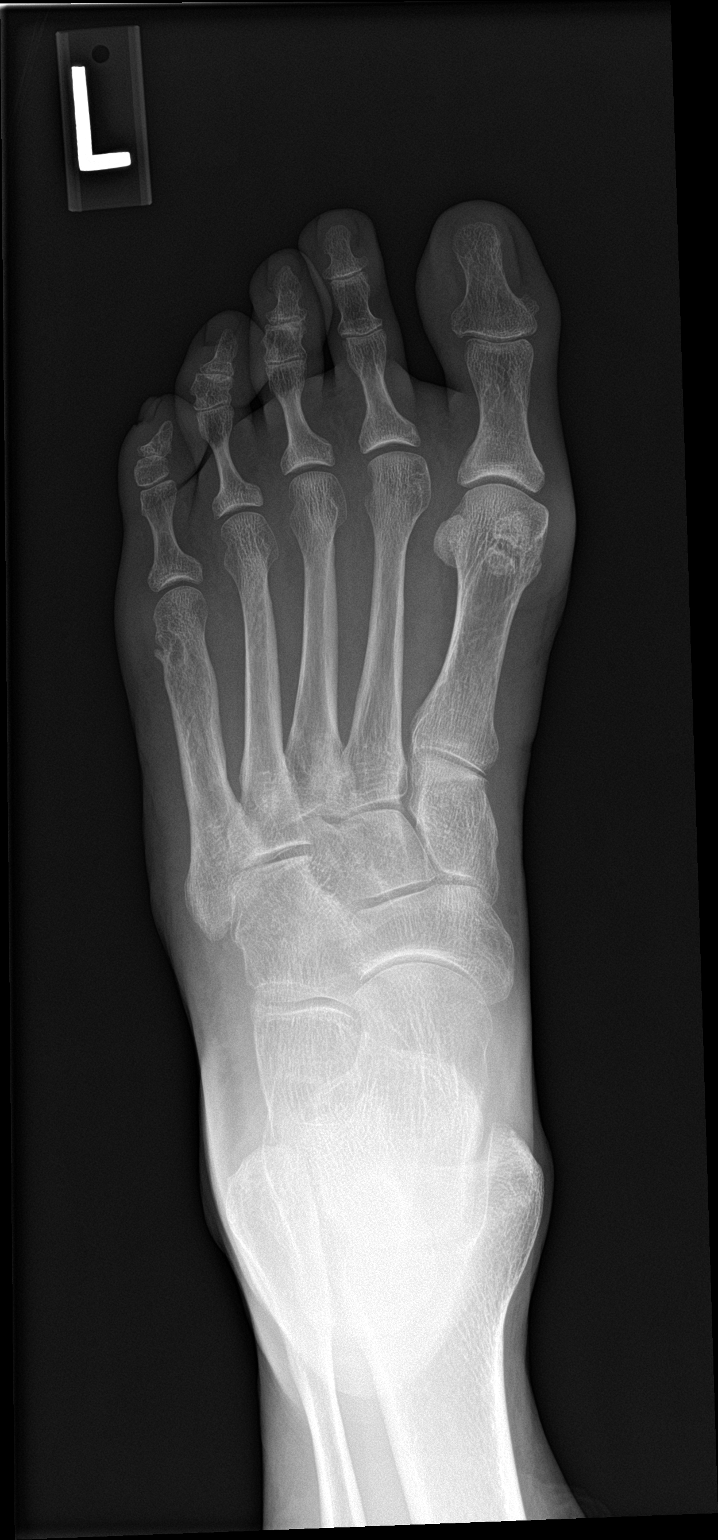

[foot obl]
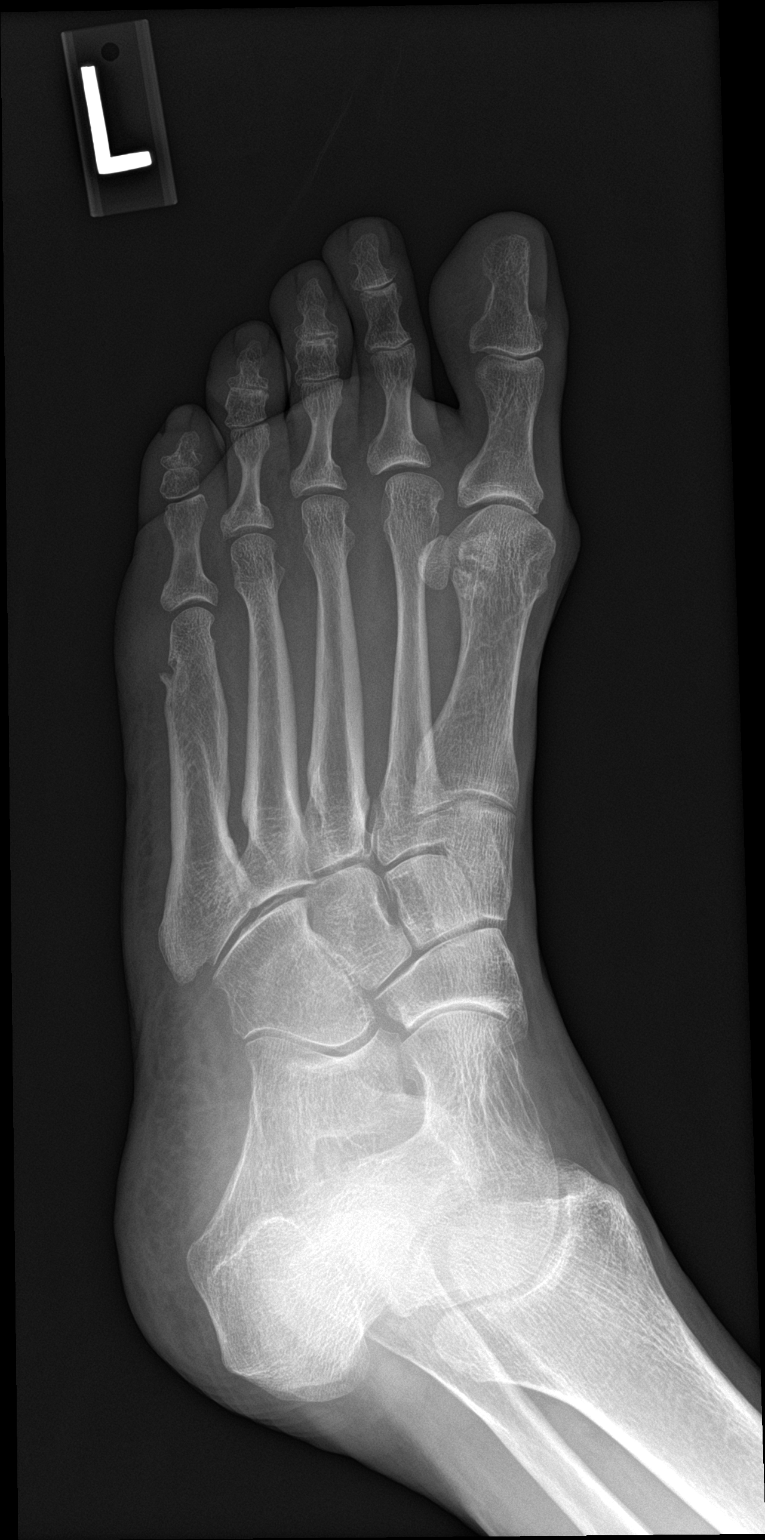

[foot lat]
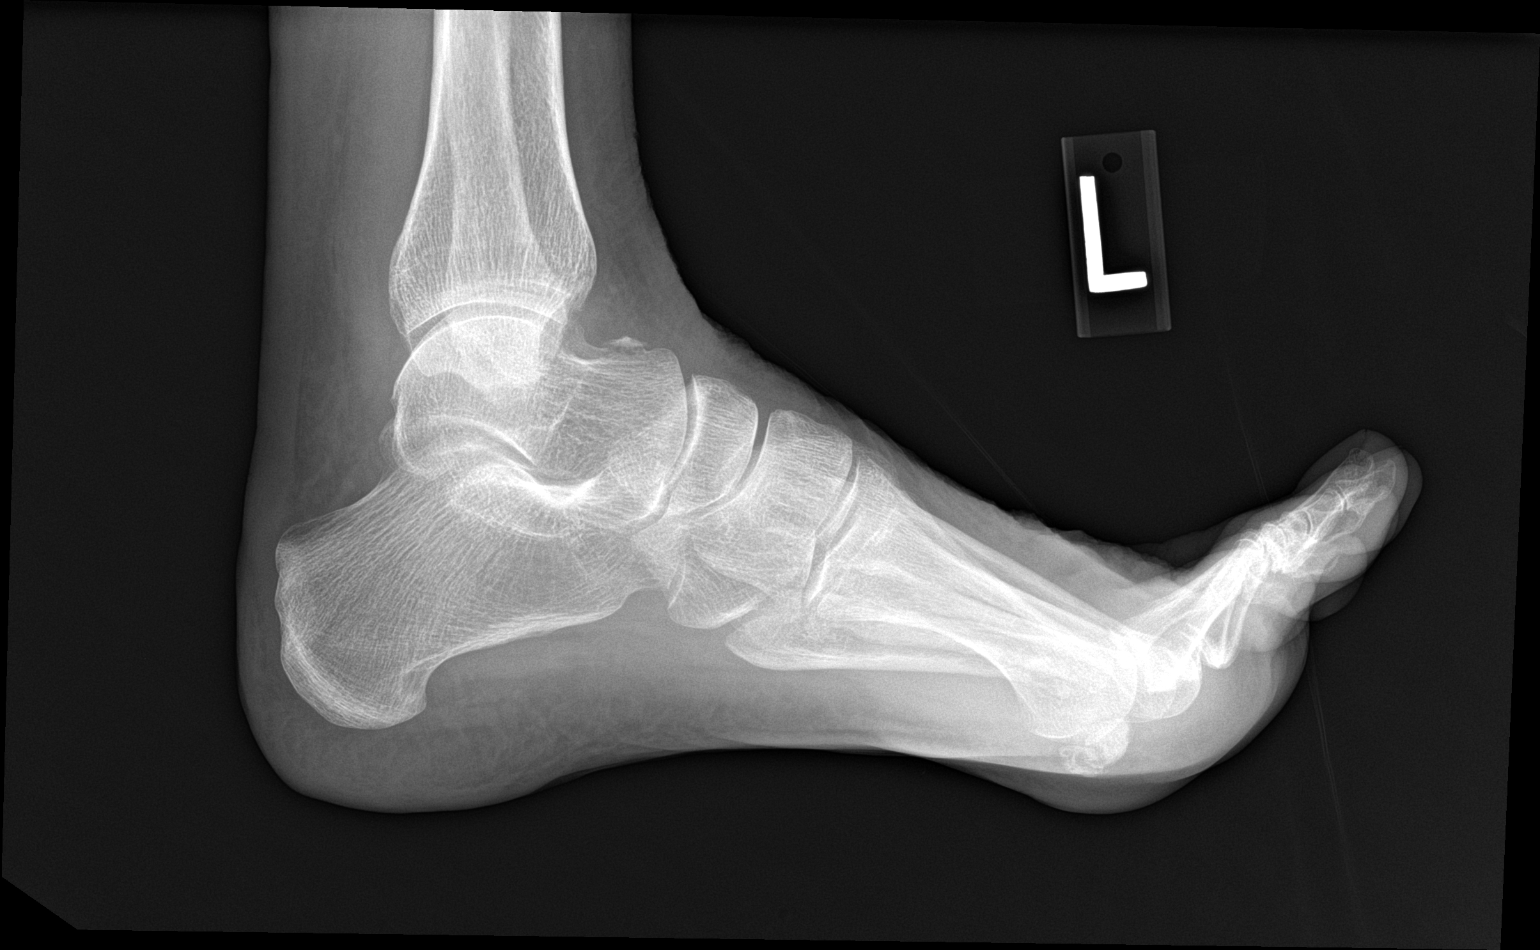

[3 of 3 positions shown; findings below may reference images not displayed]

FINDINGS: Tiny bony density noted along the posterior aspect of the talus.
This may represent tiny fracture fragment, possibly old. Mild
deformity noted the distal aspect of the left fifth metatarsal. This
may be from an exostosis. No other focal bony abnormality
identified. Soft tissues are unremarkable. No radiopaque foreign
body.
IMPRESSION: 1. Tiny bony density noted along the posterior aspect of the talus.
This may represent a tiny fracture fragment, possibly old.

2. Mild deformity noted the distal aspect of the left fifth
metatarsal. This may be from an exostosis. No other focal bony
abnormality identified. No radiopaque foreign body.

## 2019-09-05 MED ORDER — OXYCODONE-ACETAMINOPHEN 5-325 MG PO TABS: 1.0000 | ORAL_TABLET | Freq: Three times a day (TID) | ORAL | 0 refills | Status: DC | PRN

## 2019-09-05 NOTE — Assessment & Plan Note (Addendum)
This is a pleasant 74 year old female, she had a recent fall, inverted her left foot and ankle, she has bruising, swelling with tenderness over the dorsal lateral midfoot as well as the anterior aspect of the lateral malleolus. Cam boot, Percocet, x-rays. Return to see me in 2 weeks.  Noted dorsal talar avulsion fracture

## 2019-09-05 NOTE — Progress Notes (Addendum)
    Procedures performed today:    None.  Independent interpretation of notes and tests performed by another provider:   X-rays personally reviewed and show an avulsion from the dorsal talus  Brief History, Exam, Impression, and Recommendations:    Avulsion fracture of left talus This is a pleasant 74 year old female, she had a recent fall, inverted her left foot and ankle, she has bruising, swelling with tenderness over the dorsal lateral midfoot as well as the anterior aspect of the lateral malleolus. Cam boot, Percocet, x-rays. Return to see me in 2 weeks.  Noted dorsal talar avulsion fracture    ___________________________________________ Gwen Her. Dianah Field, M.D., ABFM., CAQSM. Primary Care and [place] Instructor of [place] of [facility] of Medicine

## 2019-09-14 ENCOUNTER — Other Ambulatory Visit: Payer: Self-pay | Admitting: *Deleted

## 2019-09-14 DIAGNOSIS — H2513 Age-related nuclear cataract, bilateral: Secondary | ICD-10-CM | POA: Diagnosis not present

## 2019-09-14 DIAGNOSIS — S99922A Unspecified injury of left foot, initial encounter: Secondary | ICD-10-CM

## 2019-09-14 MED ORDER — OXYCODONE-ACETAMINOPHEN 5-325 MG PO TABS: 1.0000 | ORAL_TABLET | Freq: Three times a day (TID) | ORAL | 0 refills | Status: DC | PRN

## 2019-09-19 ENCOUNTER — Ambulatory Visit (INDEPENDENT_AMBULATORY_CARE_PROVIDER_SITE_OTHER): Payer: Medicare Other | Admitting: Sports Medicine

## 2019-09-19 ENCOUNTER — Other Ambulatory Visit: Payer: Self-pay

## 2019-09-19 ENCOUNTER — Encounter: Payer: Self-pay | Admitting: Sports Medicine

## 2019-09-19 DIAGNOSIS — S92155D Nondisplaced avulsion fracture (chip fracture) of left talus, subsequent encounter for fracture with routine healing: Secondary | ICD-10-CM

## 2019-09-19 NOTE — Patient Instructions (Signed)
Still has tenderness over the fracture, should expect this for the next 3-5 weeks. Hold off on Pilates for the next 2 weeks. Continue boot for the next 1 to 2 weeks. Trace out the alphabet with toes every day. Return to see me in a virtual/Doximity visit in 5 weeks.

## 2019-09-19 NOTE — Assessment & Plan Note (Signed)
This is a pleasant 74 year old female, she returns, she is post a avulsion fracture from her dorsal talus. She has been in a boot now for 3 weeks feeling much better. Still has tenderness over the fracture, should expect this for the next 3-5 weeks. Hold off on Pilates for the next 2 weeks. Continue boot for the next 1 to 2 weeks. Trace out the alphabet with toes every day. Return to see me in a virtual/Doximity visit in 5 weeks.

## 2019-09-19 NOTE — Progress Notes (Signed)
    Procedures performed today:    None.  Independent interpretation of notes and tests performed by another provider:   None.  Brief History, Exam, Impression, and Recommendations:    Avulsion fracture of left talus This is a pleasant 74 year old female, she returns, she is post a avulsion fracture from her dorsal talus. She has been in a boot now for 3 weeks feeling much better. Still has tenderness over the fracture, should expect this for the next 3-5 weeks. Hold off on Pilates for the next 2 weeks. Continue boot for the next 1 to 2 weeks. Trace out the alphabet with toes every day. Return to see me in a virtual/Doximity visit in 5 weeks.    ___________________________________________ Gwen Her. Dianah Field, M.D., ABFM., CAQSM. Primary Care and [place] Instructor of [place] of [facility] of Medicine

## 2019-10-11 ENCOUNTER — Ambulatory Visit: Payer: Medicare Other | Admitting: Sports Medicine

## 2019-10-17 DIAGNOSIS — Z8781 Personal history of (healed) traumatic fracture: Secondary | ICD-10-CM | POA: Diagnosis not present

## 2019-10-17 DIAGNOSIS — E559 Vitamin D deficiency, unspecified: Secondary | ICD-10-CM | POA: Diagnosis not present

## 2019-10-17 DIAGNOSIS — Z2821 Immunization not carried out because of patient refusal: Secondary | ICD-10-CM | POA: Diagnosis not present

## 2019-10-17 DIAGNOSIS — F5101 Primary insomnia: Secondary | ICD-10-CM | POA: Diagnosis not present

## 2019-10-17 DIAGNOSIS — Z01419 Encounter for gynecological examination (general) (routine) without abnormal findings: Secondary | ICD-10-CM | POA: Diagnosis not present

## 2019-10-17 DIAGNOSIS — Z6822 Body mass index (BMI) 22.0-22.9, adult: Secondary | ICD-10-CM | POA: Diagnosis not present

## 2019-10-17 DIAGNOSIS — E871 Hypo-osmolality and hyponatremia: Secondary | ICD-10-CM | POA: Diagnosis not present

## 2019-10-24 ENCOUNTER — Telehealth (INDEPENDENT_AMBULATORY_CARE_PROVIDER_SITE_OTHER): Payer: Medicare Other | Admitting: Sports Medicine

## 2019-10-24 DIAGNOSIS — M7552 Bursitis of left shoulder: Secondary | ICD-10-CM | POA: Diagnosis not present

## 2019-10-24 DIAGNOSIS — S92155D Nondisplaced avulsion fracture (chip fracture) of left talus, subsequent encounter for fracture with routine healing: Secondary | ICD-10-CM | POA: Diagnosis not present

## 2019-10-24 DIAGNOSIS — M1712 Unilateral primary osteoarthritis, left knee: Secondary | ICD-10-CM

## 2019-10-24 NOTE — Progress Notes (Addendum)
   Virtual Visit via WebEx/MyChart   I connected with  Kristen Jacobs  on 10/24/19 via WebEx/MyChart/Doximity Video and verified that I am speaking with the correct person using two identifiers.   I discussed the limitations, risks, security and privacy concerns of performing an evaluation and management service by WebEx/MyChart/Doximity Video, including the higher likelihood of inaccurate diagnosis and treatment, and the availability of in person appointments.  We also discussed the likely need of an additional face to face encounter for complete and high quality delivery of care.  I also discussed with the patient that there may be a patient responsible charge related to this service. The patient expressed understanding and wishes to proceed.  Provider location is in medical facility. Patient location is at their home, different from provider location. People involved in care of the patient during this telehealth encounter were myself, my nurse/medical assistant, and my front office/scheduling team member.  Review of Systems: No fevers, chills, night sweats, weight loss, chest pain, or shortness of breath.   Objective Findings:    General: Speaking full sentences, no audible heavy breathing.  Sounds alert and appropriately interactive.  Appears well.  Face symmetric.  Extraocular movements intact.  Pupils equal and round.  No nasal flaring or accessory muscle use visualized.  Independent interpretation of tests performed by another provider:   None.  Brief History, Exam, Impression, and Recommendations:    Primary osteoarthritis of left knee Kristen Jacobs has a bit of gelling, medial joint line pain, all consistent with osteoarthritis, her pain is not intolerable so she is going to live with it now, try more of an anti-inflammatory diet and follow-up with me for injection if she needs.  Subacromial bursitis of left shoulder joint I also somebody approximately 3 months ago for a left shoulder  bursitis, she had impingement signs, she desires a nonpharmacologic approach and will do mostly an anti-inflammatory diet as above, continue her rehab exercises and come to see me for a subacromial injection if no better.  Avulsion fracture of left talus We are now also approximately 7 to 8 weeks post an avulsion fracture from her left talus, she is doing extremely well, she is back to Pilates, no further intervention needed.   I discussed the above assessment and treatment plan with the patient. The patient was provided an opportunity to ask questions and all were answered. The patient agreed with the plan and demonstrated an understanding of the instructions.   The patient was advised to call back or seek an in-person evaluation if the symptoms worsen or if the condition fails to improve as anticipated.   I provided 30 minutes of face to face and non-face-to-face time during this encounter date, time was needed to gather information, review chart, records, communicate/coordinate with staff remotely, as well as complete documentation.   ___________________________________________ Gwen Her. Dianah Field, M.D., ABFM., CAQSM. Primary Care and [place] Instructor of [place] of [facility] of Medicine

## 2019-10-24 NOTE — Assessment & Plan Note (Signed)
We are now also approximately 7 to 8 weeks post an avulsion fracture from her left talus, she is doing extremely well, she is back to Pilates, no further intervention needed.

## 2019-10-24 NOTE — Assessment & Plan Note (Signed)
Kristen Jacobs has a bit of gelling, medial joint line pain, all consistent with osteoarthritis, her pain is not intolerable so she is going to live with it now, try more of an anti-inflammatory diet and follow-up with me for injection if she needs.

## 2019-10-24 NOTE — Assessment & Plan Note (Signed)
I also somebody approximately 3 months ago for a left shoulder bursitis, she had impingement signs, she desires a nonpharmacologic approach and will do mostly an anti-inflammatory diet as above, continue her rehab exercises and come to see me for a subacromial injection if no better.

## 2019-12-02 DIAGNOSIS — M810 Age-related osteoporosis without current pathological fracture: Secondary | ICD-10-CM | POA: Diagnosis not present

## 2019-12-02 DIAGNOSIS — H02831 Dermatochalasis of right upper eyelid: Secondary | ICD-10-CM | POA: Diagnosis not present

## 2019-12-02 DIAGNOSIS — Z8781 Personal history of (healed) traumatic fracture: Secondary | ICD-10-CM | POA: Diagnosis not present

## 2019-12-02 DIAGNOSIS — Z1231 Encounter for screening mammogram for malignant neoplasm of breast: Secondary | ICD-10-CM | POA: Diagnosis not present

## 2019-12-02 DIAGNOSIS — H2513 Age-related nuclear cataract, bilateral: Secondary | ICD-10-CM | POA: Diagnosis not present

## 2019-12-02 DIAGNOSIS — M8589 Other specified disorders of bone density and structure, multiple sites: Secondary | ICD-10-CM | POA: Diagnosis not present

## 2019-12-02 DIAGNOSIS — H353131 Nonexudative age-related macular degeneration, bilateral, early dry stage: Secondary | ICD-10-CM | POA: Diagnosis not present

## 2019-12-05 DIAGNOSIS — Z23 Encounter for immunization: Secondary | ICD-10-CM | POA: Diagnosis not present

## 2019-12-07 ENCOUNTER — Other Ambulatory Visit: Payer: Self-pay

## 2019-12-07 ENCOUNTER — Ambulatory Visit (INDEPENDENT_AMBULATORY_CARE_PROVIDER_SITE_OTHER): Payer: Medicare Other | Admitting: Sports Medicine

## 2019-12-07 DIAGNOSIS — M5412 Radiculopathy, cervical region: Secondary | ICD-10-CM

## 2019-12-07 DIAGNOSIS — M25532 Pain in left wrist: Secondary | ICD-10-CM | POA: Diagnosis not present

## 2019-12-07 DIAGNOSIS — M25531 Pain in right wrist: Secondary | ICD-10-CM | POA: Diagnosis not present

## 2019-12-07 MED ORDER — MELOXICAM 15 MG PO TABS: ORAL_TABLET | ORAL | 3 refills | Status: DC

## 2019-12-07 NOTE — Assessment & Plan Note (Signed)
Kristen Jacobs is a pleasant 74 year old female, we did a TFCC injection back in February of this year. She is having persistence of pain, I want to add meloxicam and put her in a wrist rehab regimen. X-rays were done earlier this year. If no better we will get an MRI. We can certainly consider another TFCC injection, versus referral to surgery.

## 2019-12-07 NOTE — Assessment & Plan Note (Signed)
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 Adding neck conditioning exercises, meloxicam, return to see me in a month, MRI for interventional planning if no better.

## 2019-12-07 NOTE — Progress Notes (Signed)
    Procedures performed today:    None.  Independent interpretation of notes and tests performed by another provider:   None.  Brief History, Exam, Impression, and Recommendations:    Bilateral wrist pain Kristen Jacobs is a pleasant 74 year old female, we did a TFCC injection back in February of this year. She is having persistence of pain, I want to add meloxicam and put her in a wrist rehab regimen. X-rays were done earlier this year. If no better we will get an MRI. We can certainly consider another TFCC injection, versus referral to surgery.  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 Adding neck conditioning exercises, meloxicam, return to see me in a month, MRI for interventional planning if no better.    ___________________________________________ Gwen Her. Dianah Field, M.D., ABFM., CAQSM. Primary Care and [place] Instructor of [place] of [facility] of Medicine

## 2019-12-20 DIAGNOSIS — K219 Gastro-esophageal reflux disease without esophagitis: Secondary | ICD-10-CM | POA: Diagnosis not present

## 2019-12-23 DIAGNOSIS — M810 Age-related osteoporosis without current pathological fracture: Secondary | ICD-10-CM | POA: Diagnosis not present

## 2019-12-23 DIAGNOSIS — Z6822 Body mass index (BMI) 22.0-22.9, adult: Secondary | ICD-10-CM | POA: Diagnosis not present

## 2019-12-26 DIAGNOSIS — R52 Pain, unspecified: Secondary | ICD-10-CM | POA: Diagnosis not present

## 2020-01-02 DIAGNOSIS — Z23 Encounter for immunization: Secondary | ICD-10-CM | POA: Diagnosis not present

## 2020-01-04 ENCOUNTER — Ambulatory Visit: Payer: Medicare Other | Admitting: Sports Medicine

## 2020-01-17 DIAGNOSIS — H25013 Cortical age-related cataract, bilateral: Secondary | ICD-10-CM | POA: Diagnosis not present

## 2020-01-17 DIAGNOSIS — H2511 Age-related nuclear cataract, right eye: Secondary | ICD-10-CM | POA: Diagnosis not present

## 2020-01-17 DIAGNOSIS — H25043 Posterior subcapsular polar age-related cataract, bilateral: Secondary | ICD-10-CM | POA: Diagnosis not present

## 2020-01-17 DIAGNOSIS — H2513 Age-related nuclear cataract, bilateral: Secondary | ICD-10-CM | POA: Diagnosis not present

## 2020-01-17 DIAGNOSIS — H18413 Arcus senilis, bilateral: Secondary | ICD-10-CM | POA: Diagnosis not present

## 2020-01-31 ENCOUNTER — Ambulatory Visit: Payer: Medicare Other | Admitting: Sports Medicine

## 2020-01-31 ENCOUNTER — Telehealth: Payer: Self-pay | Admitting: Family Medicine

## 2020-01-31 DIAGNOSIS — M25532 Pain in left wrist: Secondary | ICD-10-CM

## 2020-01-31 DIAGNOSIS — M25531 Pain in right wrist: Secondary | ICD-10-CM

## 2020-01-31 NOTE — Telephone Encounter (Signed)
Done.   She will get a call for scheduling

## 2020-01-31 NOTE — Telephone Encounter (Signed)
Pt is requesting to have an MRI ordered for her Bilateral wrist pain since she was unable to be seen today.

## 2020-02-05 ENCOUNTER — Other Ambulatory Visit: Payer: Medicare Other

## 2020-02-08 ENCOUNTER — Ambulatory Visit (INDEPENDENT_AMBULATORY_CARE_PROVIDER_SITE_OTHER): Payer: Medicare Other | Admitting: Sports Medicine

## 2020-02-08 ENCOUNTER — Other Ambulatory Visit: Payer: Self-pay

## 2020-02-08 DIAGNOSIS — M25531 Pain in right wrist: Secondary | ICD-10-CM | POA: Diagnosis not present

## 2020-02-08 DIAGNOSIS — M5412 Radiculopathy, cervical region: Secondary | ICD-10-CM

## 2020-02-08 DIAGNOSIS — M1990 Unspecified osteoarthritis, unspecified site: Secondary | ICD-10-CM | POA: Diagnosis not present

## 2020-02-08 DIAGNOSIS — M1380 Other specified arthritis, unspecified site: Secondary | ICD-10-CM

## 2020-02-08 DIAGNOSIS — M25532 Pain in left wrist: Secondary | ICD-10-CM

## 2020-02-08 MED ORDER — TRIAZOLAM 0.25 MG PO TABS: ORAL_TABLET | ORAL | 0 refills | Status: DC

## 2020-02-08 NOTE — Assessment & Plan Note (Addendum)
This is a very pleasant 74 year old female, she has a longstanding history of neck pain with greater than 6 weeks of conservative treatment, persistent pain, getting some updated x-rays today, and I would like to go ahead and order an MRI for interventional planning. We can hopefully get this done after her wrist MRIs on Sunday. Adding triazolam for sedation.

## 2020-02-08 NOTE — Progress Notes (Addendum)
    Procedures performed today:    None.  Independent interpretation of notes and tests performed by another provider:   None.  Brief History, Exam, Impression, and Recommendations:    Radiculitis of right cervical region This is a very pleasant 74 year old female, she has a longstanding history of neck pain with greater than 6 weeks of conservative treatment, persistent pain, getting some updated x-rays today, and I would like to go ahead and order an MRI for interventional planning. We can hopefully get this done after Jacobs wrist MRIs on Sunday. Adding triazolam for sedation.  Bilateral wrist pain Kristen Jacobs is a pleasant 74 year old female, we did a TFCC injection back in February of this year. She is having persistence of pain, I want to add meloxicam and put Jacobs in a wrist rehab regimen. X-rays were done earlier this year. If no better we will get an MRI. We can certainly consider another TFCC injection, versus referral to surgery.  MRI performed and reviewed today, there are constellation of findings in both wrist joints which can be seen in inflammatory arthritis such as rheumatoid arthritis, I am going to go ahead and pull the trigger for a full rheumatoid work-up.  She can get this blood work done at [facility], fasting is not necessary.    ___________________________________________ Kristen Jacobs. Kristen Jacobs, M.D., ABFM., CAQSM. Primary Care and [place] Instructor of [place] of [facility] of Medicine

## 2020-02-12 ENCOUNTER — Ambulatory Visit (INDEPENDENT_AMBULATORY_CARE_PROVIDER_SITE_OTHER): Payer: Medicare Other

## 2020-02-12 ENCOUNTER — Other Ambulatory Visit: Payer: Self-pay

## 2020-02-12 ENCOUNTER — Ambulatory Visit: Payer: Medicare Other

## 2020-02-12 DIAGNOSIS — M2578 Osteophyte, vertebrae: Secondary | ICD-10-CM | POA: Diagnosis not present

## 2020-02-12 DIAGNOSIS — M25531 Pain in right wrist: Secondary | ICD-10-CM

## 2020-02-12 DIAGNOSIS — M19032 Primary osteoarthritis, left wrist: Secondary | ICD-10-CM | POA: Diagnosis not present

## 2020-02-12 DIAGNOSIS — S63592A Other specified sprain of left wrist, initial encounter: Secondary | ICD-10-CM

## 2020-02-12 DIAGNOSIS — M19011 Primary osteoarthritis, right shoulder: Secondary | ICD-10-CM | POA: Diagnosis not present

## 2020-02-12 DIAGNOSIS — W19XXXA Unspecified fall, initial encounter: Secondary | ICD-10-CM | POA: Diagnosis not present

## 2020-02-12 DIAGNOSIS — M5021 Other cervical disc displacement,  high cervical region: Secondary | ICD-10-CM | POA: Diagnosis not present

## 2020-02-12 DIAGNOSIS — M19031 Primary osteoarthritis, right wrist: Secondary | ICD-10-CM | POA: Diagnosis not present

## 2020-02-12 DIAGNOSIS — M65832 Other synovitis and tenosynovitis, left forearm: Secondary | ICD-10-CM | POA: Diagnosis not present

## 2020-02-12 DIAGNOSIS — M47812 Spondylosis without myelopathy or radiculopathy, cervical region: Secondary | ICD-10-CM | POA: Diagnosis not present

## 2020-02-12 DIAGNOSIS — S63511A Sprain of carpal joint of right wrist, initial encounter: Secondary | ICD-10-CM | POA: Diagnosis not present

## 2020-02-12 DIAGNOSIS — M4802 Spinal stenosis, cervical region: Secondary | ICD-10-CM

## 2020-02-12 DIAGNOSIS — R600 Localized edema: Secondary | ICD-10-CM | POA: Diagnosis not present

## 2020-02-12 IMAGING — MR MR CERVICAL SPINE W/O CM
5 series · 38 of 48 positions shown · non-contrast
Comparison: None available.

CLINICAL DATA: Initial evaluation for cervical radiculopathy,
bilateral C7 and C8 radiculitis. Interventional planning.

EXAM:
MRI CERVICAL SPINE WITHOUT CONTRAST
TECHNIQUE: Multiplanar, multisequence MR imaging of the cervical spine was
performed. No intravenous contrast was administered.

[Series 4: T2 · sagittal · 3.0mm · 0.69mm/px · 8 of 13 slices shown (1 of 2)]
[im 1/13]
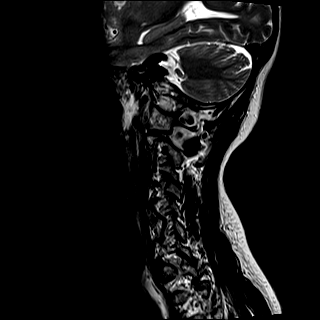
[im 2/13]
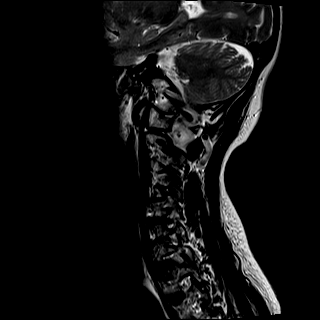
[im 4/13]
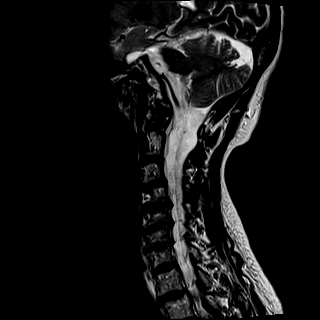
[im 6/13]
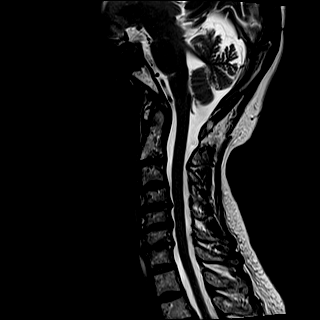
[im 7/13]
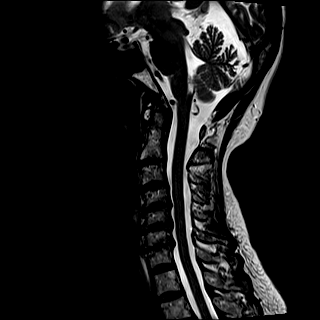
[im 9/13]
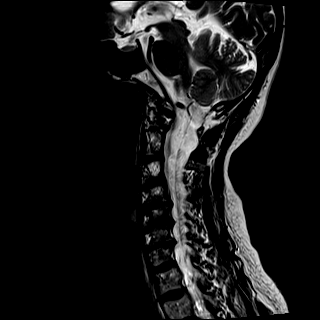
[im 11/13]
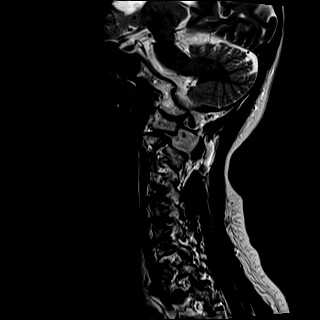
[im 13/13]
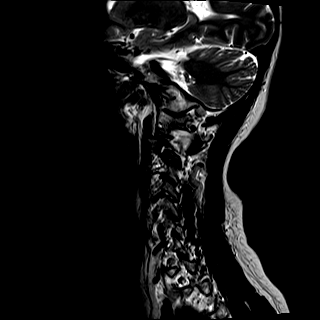

[Series 5: T1 · sagittal · 3.0mm · 0.86mm/px · 7 of 13 slices shown]
[im 1/13]
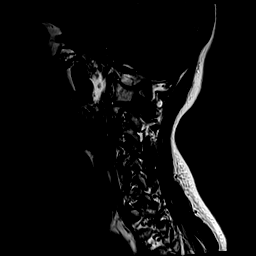
[im 3/13]
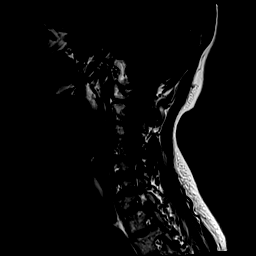
[im 5/13]
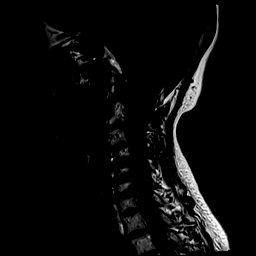
[im 7/13]
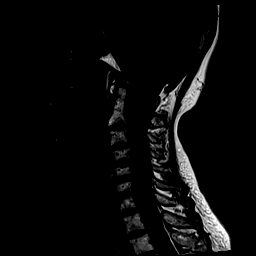
[im 9/13]
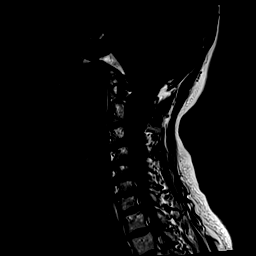
[im 11/13]
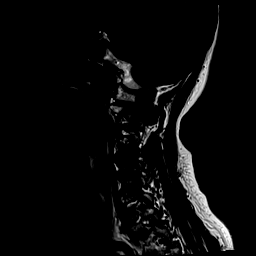
[im 13/13]
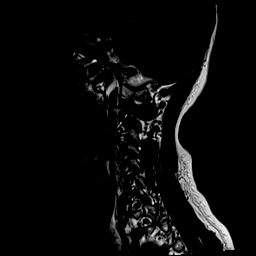

[Series 6: STIR · sagittal · 3.0mm · 0.69mm/px · 7 of 13 slices shown]
[im 1/13]
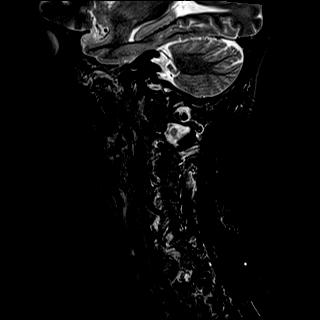
[im 3/13]
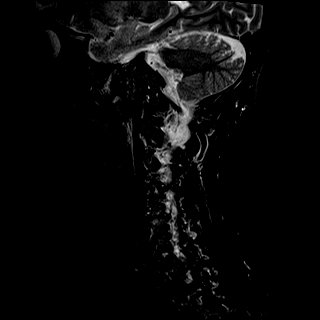
[im 5/13]
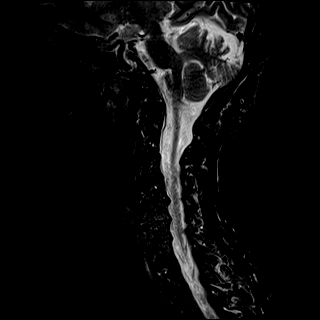
[im 7/13]
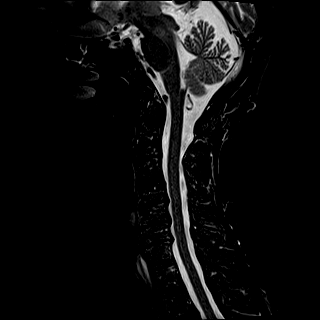
[im 9/13]
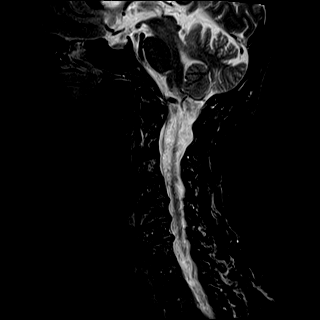
[im 11/13]
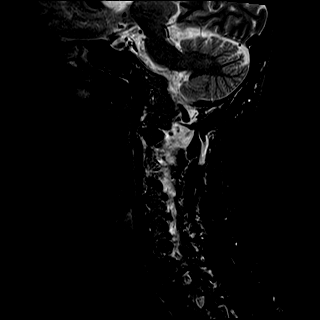
[im 13/13]
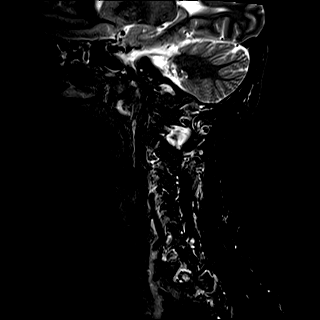

[Series 7: T2 · axial · 3.0mm · 0.62mm/px · z∈[-82,+10]mm · 9 of 25 slices shown (2 of 2)]
[im 1/25]
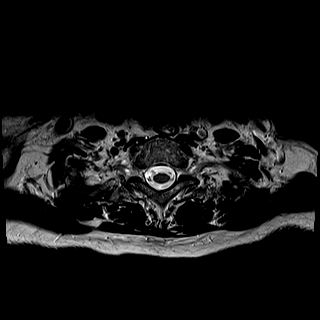
[im 5/25]
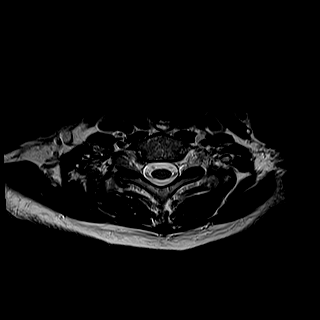
[im 9/25]
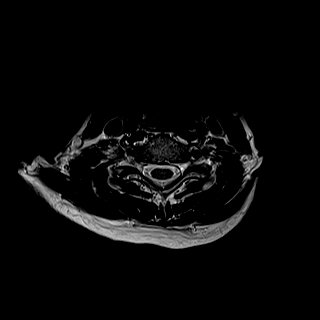
[im 11/25]
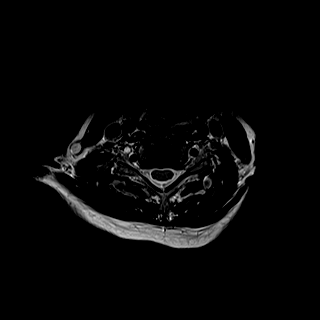
[im 13/25]
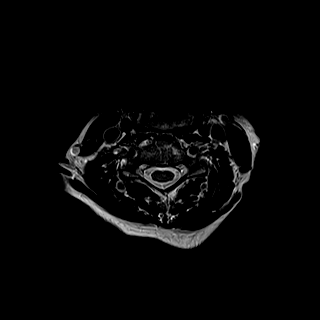
[im 15/25]
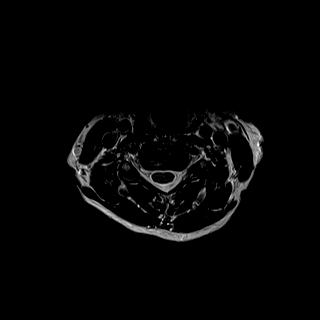
[im 17/25]
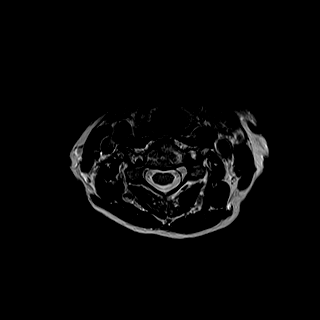
[im 21/25]
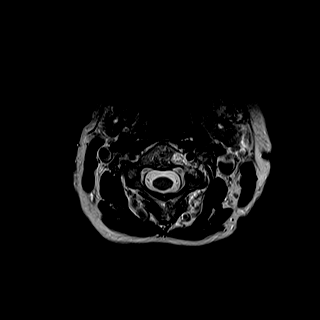
[im 25/25]
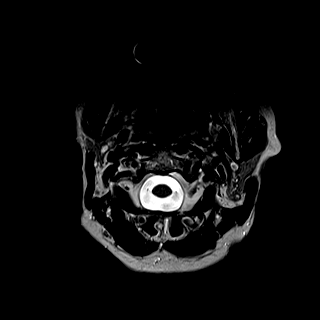

[Series 8: mpgr ax · axial · 3.0mm · 0.35mm/px · z∈[-75,+1]mm · 7 of 25 slices shown]
[im 1/25]
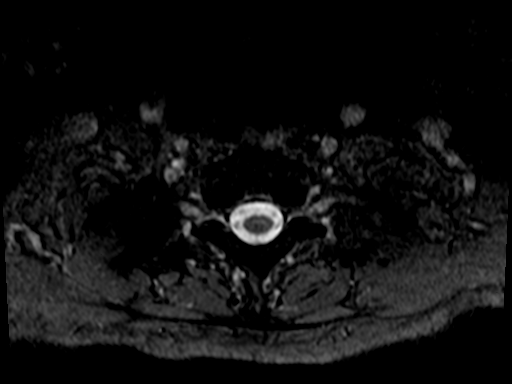
[im 5/25]
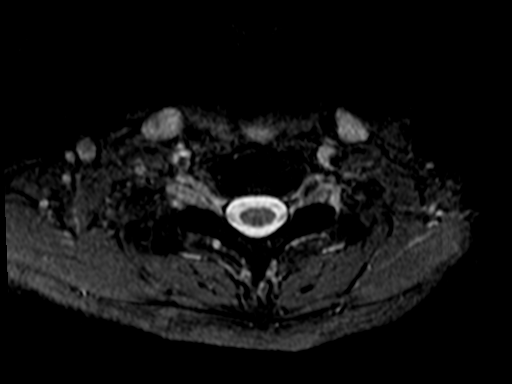
[im 9/25]
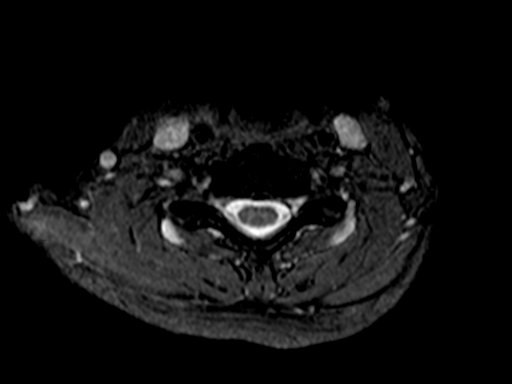
[im 11/25]
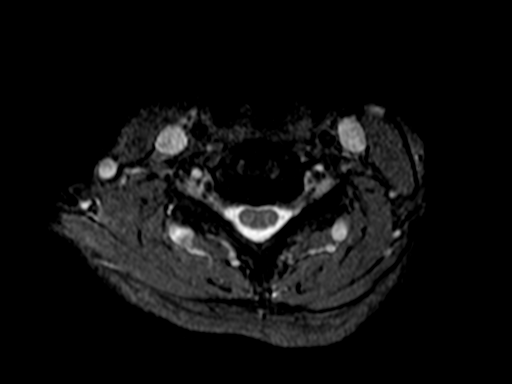
[im 15/25]
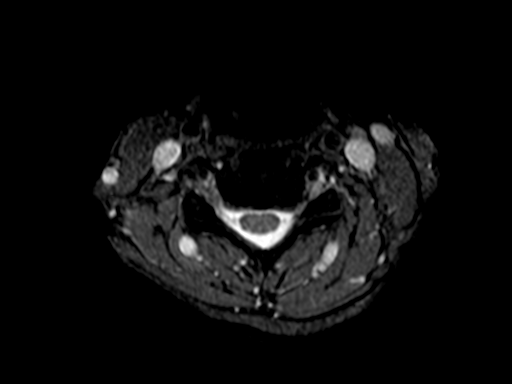
[im 17/25]
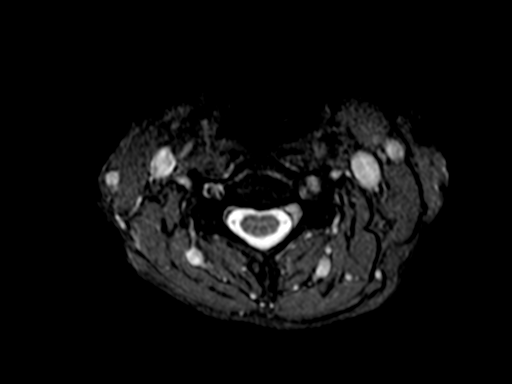
[im 21/25]
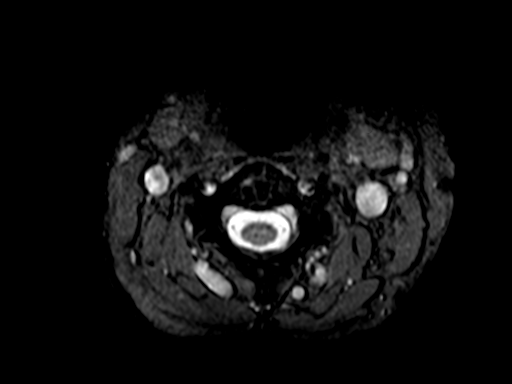

[38 of 48 positions shown; findings below may reference images not displayed]

FINDINGS: Alignment: Straightening of the normal cervical lordosis. No
significant listhesis.

Vertebrae: Vertebral body height maintained without acute or chronic
fracture. Bone marrow signal intensity diffusely heterogeneous but
overall within normal limits. No discrete or worrisome osseous
lesions. No abnormal marrow edema.

Cord: Normal signal and morphology.

Posterior Fossa, vertebral arteries, paraspinal tissues: Visualized
brain and posterior fossa within normal limits. Craniocervical
junction normal. Paraspinous and prevertebral soft tissues within
normal limits. Normal flow voids seen within the vertebral arteries
bilaterally. 5 mm right thyroid nodule noted, felt to be of doubtful
significance given size and patient age. No follow-up imaging
recommended regarding this lesion.

Disc levels:

C2-C3: Minimal disc bulge with uncovertebral hypertrophy. Left-sided
facet degeneration. No canal or foraminal stenosis.

C3-C4: Mild disc bulge with uncovertebral hypertrophy. Mild
bilateral facet hypertrophy. No significant canal or foraminal
stenosis.

C4-C5: Degenerative intervertebral disc space narrowing with diffuse
disc osteophyte complex. Broad posterior component flattens and
partially faces the ventral thecal sac. Mild spinal stenosis without
cord impingement. Moderate left C5 foraminal narrowing. No
significant right foraminal encroachment.

C5-C6: Degenerative intervertebral disc space narrowing with diffuse
disc osteophyte complex. Broad posterior component flattens and
partially faces the ventral thecal sac with resultant mild spinal
stenosis. No cord impingement. Moderate bilateral C6 foraminal
narrowing.

C6-C7: Advanced degenerative intervertebral disc space narrowing
with diffuse disc osteophyte complex. Flattening and partial
effacement of the ventral thecal sac with resultant mild spinal
stenosis. No cord deformity. Mild right with mild to moderate left
C7 foraminal narrowing.

C7-T1: Normal interspace. Moderate right with mild left facet
hypertrophy. No spinal stenosis. Mild right C8 foraminal narrowing.
No significant left foraminal stenosis.
IMPRESSION: 1. Multilevel cervical spondylosis with resultant mild spinal
stenosis at C4-5 through C6-7. No cord impingement.
2. Multifactorial degenerative changes with resultant multilevel
foraminal narrowing as above. Notable findings include moderate left
C5 foraminal stenosis, moderate bilateral C6 foraminal narrowing,
mild to moderate left greater than right C7 foraminal stenosis, with
mild right C8 foraminal narrowing.

## 2020-02-12 IMAGING — MR MR WRIST*L* W/O CM
5 series · 40 of 40 positions shown · non-contrast
Comparison: Plain films left wrist [DATE].

CLINICAL DATA: Left wrist pain for 4 months.  No known injury.

EXAM:
MR OF THE LEFT WRIST WITHOUT CONTRAST
TECHNIQUE: Multiplanar, multisequence MR imaging of the left wrist was
performed. No intravenous contrast was administered.

[Series 102: T1 · coronal · 3.0mm · 0.39mm/px · 5 of 14 slices shown (1 of 2)]
[im 1/14]
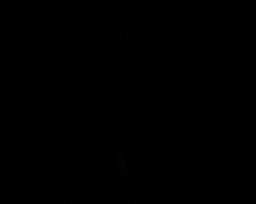
[im 4/14]
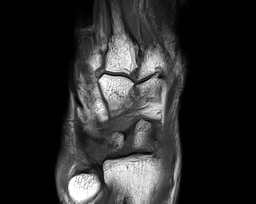
[im 7/14]
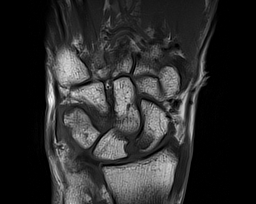
[im 10/14]
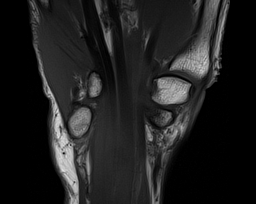
[im 14/14]
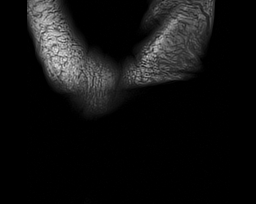

[Series 103: T1 · oblique · 3.0mm · 0.39mm/px · 10 of 22 slices shown (2 of 2)]
[im 1/22]
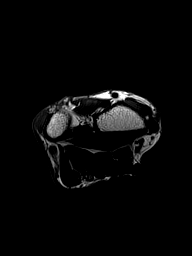
[im 3/22]
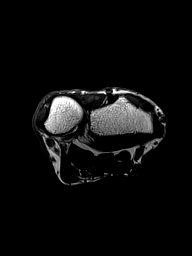
[im 5/22]
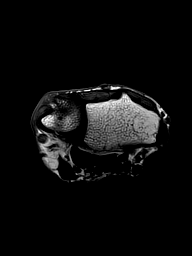
[im 8/22]
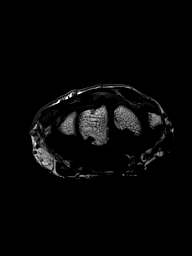
[im 10/22]
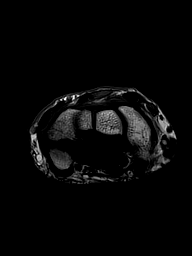
[im 12/22]
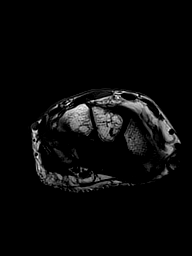
[im 15/22]
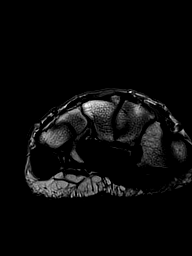
[im 17/22]
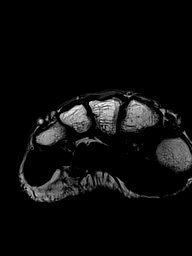
[im 19/22]
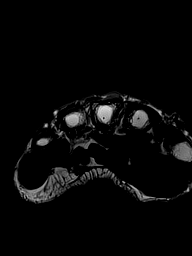
[im 22/22]
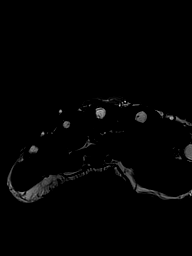

[Series 104: T2 fat-sat · oblique · 3.0mm · 0.39mm/px · 10 of 22 slices shown]
[im 1/22]
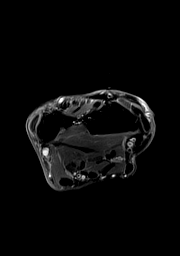
[im 3/22]
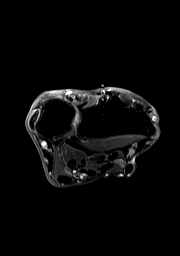
[im 5/22]
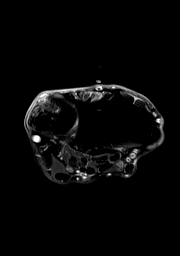
[im 8/22]
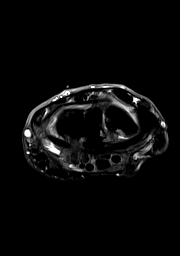
[im 10/22]
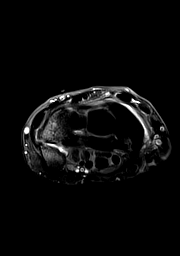
[im 12/22]
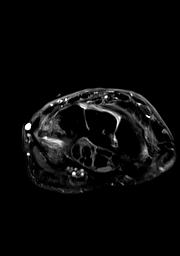
[im 15/22]
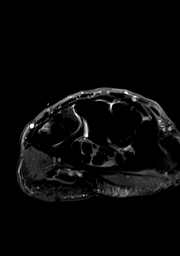
[im 17/22]
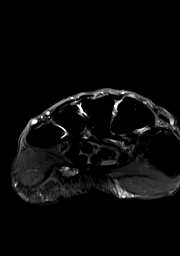
[im 19/22]
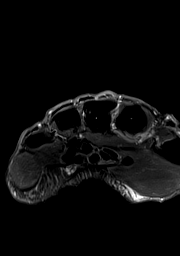
[im 22/22]
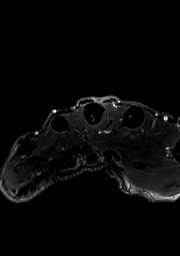

[Series 105: PD fat-sat · coronal · 3.0mm · 0.20mm/px · 6 of 14 slices shown (1 of 2)]
[im 1/14]
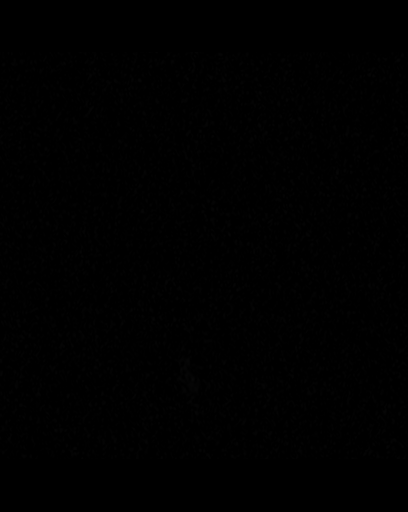
[im 3/14]
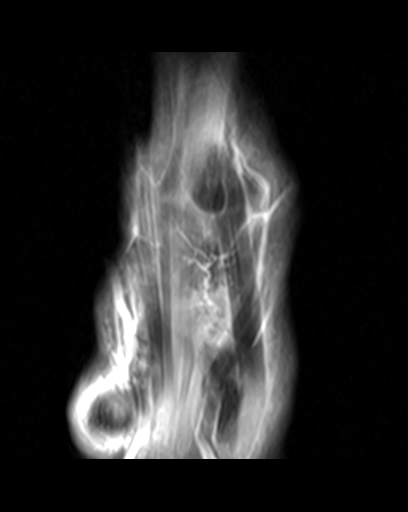
[im 6/14]
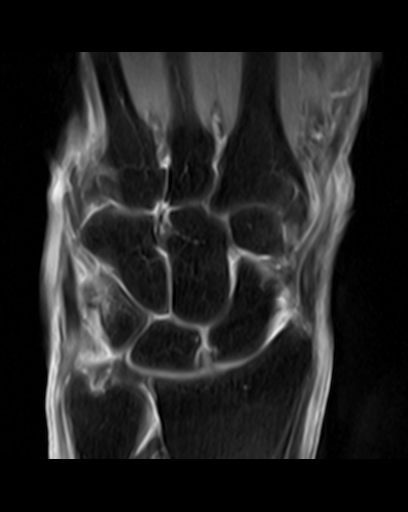
[im 8/14]
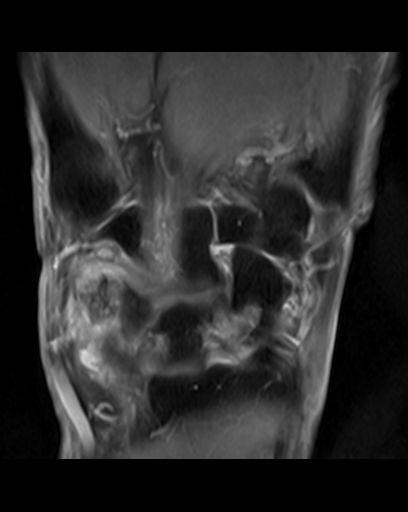
[im 11/14]
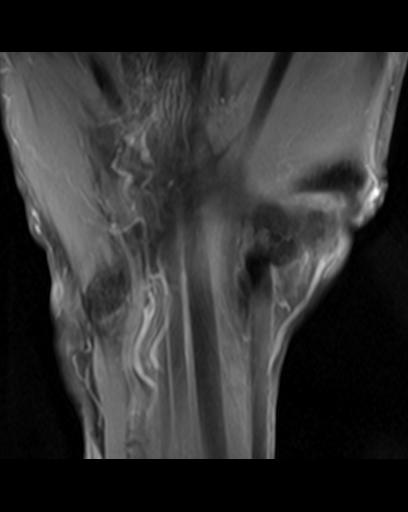
[im 14/14]
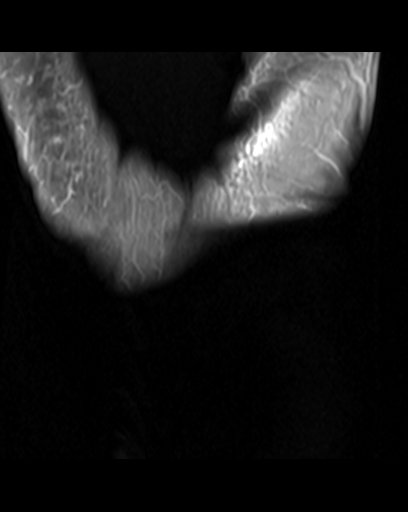

[Series 106: PD fat-sat · oblique · 3.0mm · 0.20mm/px · 9 of 20 slices shown (2 of 2)]
[im 1/20]
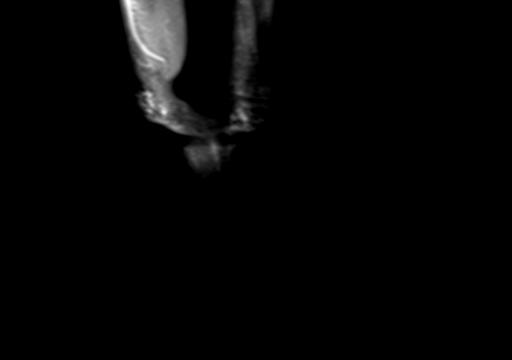
[im 3/20]
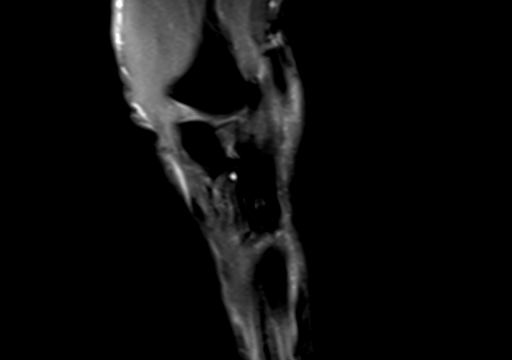
[im 5/20]
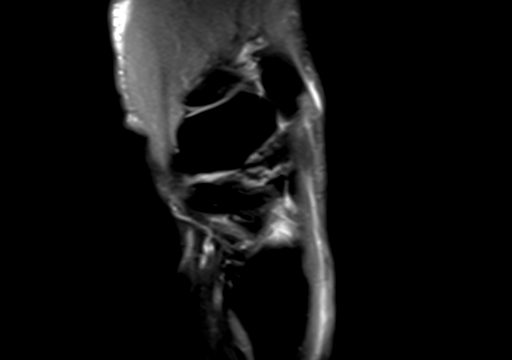
[im 8/20]
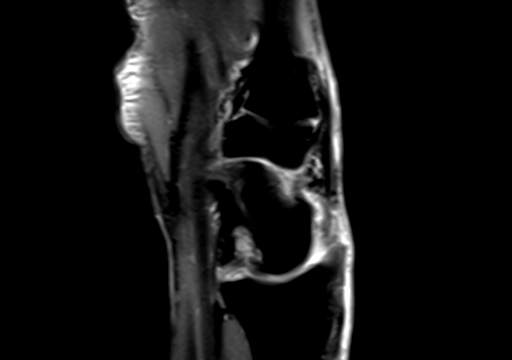
[im 10/20]
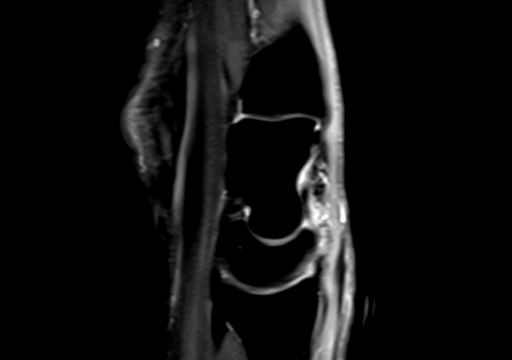
[im 12/20]
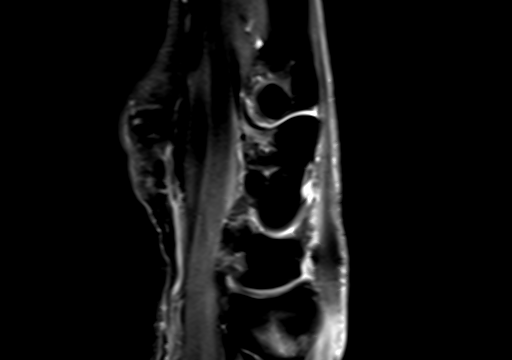
[im 15/20]
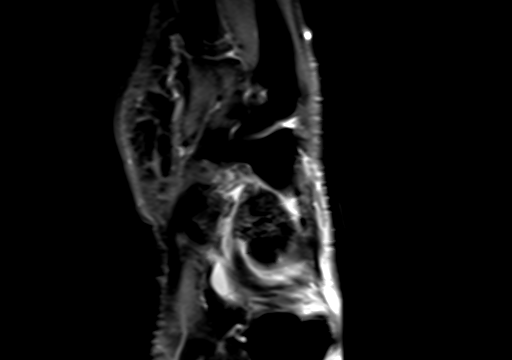
[im 17/20]
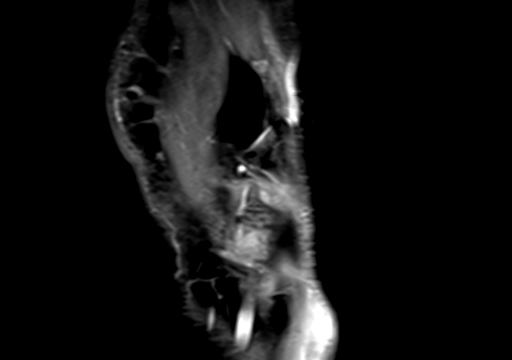
[im 20/20]
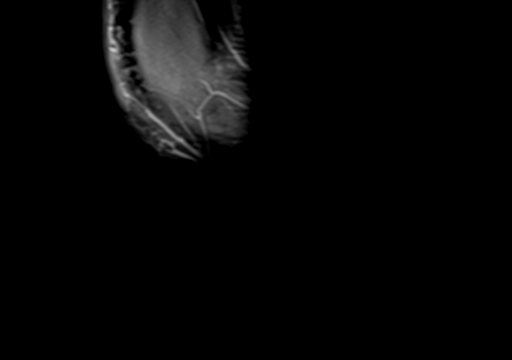

[40 of 40 positions shown; findings below may reference images not displayed]

FINDINGS: Ligaments: Intact.  The scapholunate ligament is degenerated.

Triangular fibrocartilage: The ulnar limbs of the TFC are
degenerated. A tiny tear is seen in the ulnar limb which attaches to
the styloid

Tendons: Intrasubstance increased T2 signal is seen in the
compartment 1, 2 and 4 extensor tendons consistent with
tenosynovitis. More severe appearing tenosynovitis is also present
in the extensor carpi ulnaris where there is longitudinal split
tearing of the tendon just distal to the ulnar styloid.

Carpal tunnel/median nerve: Negative.

Guyon's canal: Negative.

Joint/cartilage: There is complete denuding of hyaline cartilage at
the pisotriquetral joint with intense underlying subchondral edema.
Small cortical defects in the lunate and scaphoid are worrisome for
erosions. Synovium about the wrist appears thickened.

Bones/carpal alignment: As described above.  Otherwise negative.

Other: None.  No fluid collection or mass.
IMPRESSION: Constellation of findings include tenosynovitis which is worst in
the ECU where there is split tearing of the tendon, synovial
thickening about the wrist and possible small erosions worrisome for
inflammatory arthropathy such as rheumatoid. As noted above, there
is bone-on-bone joint space narrowing and irregularity of
subchondral bone at the pisotriquetral joint with associated intense
marrow edema.

Degenerated TFC with a small tear of the ulnar limb attaching to the
ulnar styloid.

Degenerated scapholunate ligament without tear.

## 2020-02-12 IMAGING — MR MR WRIST*R* W/O CM
6 series · 40 of 40 positions shown · non-contrast
Comparison: None.

CLINICAL DATA: Diffuse right wrist pain for 4 months. No known
injury.

EXAM:
MR OF THE RIGHT WRIST WITHOUT CONTRAST
TECHNIQUE: Multiplanar, multisequence MR imaging of the right wrist was
performed. No intravenous contrast was administered.

[Series 8: T2 fat-sat · axial · 3.0mm · 0.39mm/px · z∈[-3,+56]mm · 7 of 18 slices shown (1 of 2)]
[im 1/18]
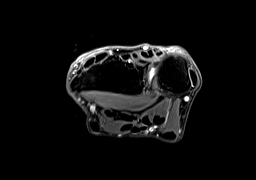
[im 3/18]
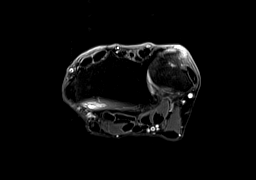
[im 6/18]
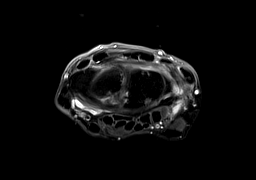
[im 9/18]
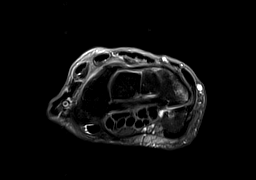
[im 12/18]
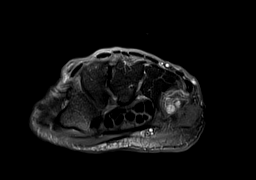
[im 15/18]
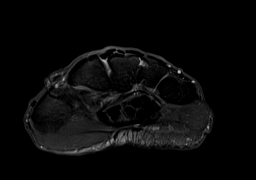
[im 18/18]
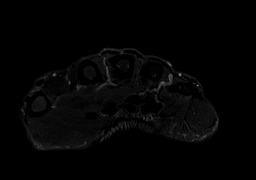

[Series 9: T1 · axial · 3.0mm · 0.39mm/px · z∈[-3,+56]mm · 7 of 18 slices shown (1 of 2)]
[im 1/18]
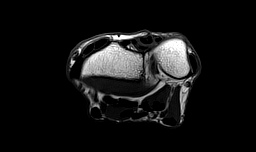
[im 3/18]
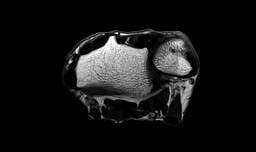
[im 6/18]
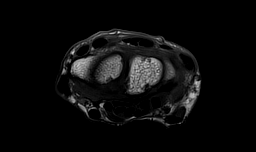
[im 9/18]
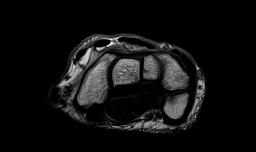
[im 12/18]
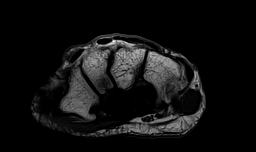
[im 15/18]
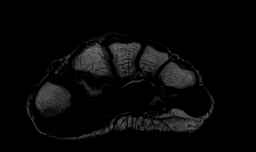
[im 18/18]
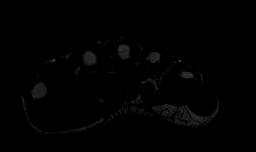

[Series 10: T1 · coronal · 3.0mm · 0.39mm/px · 6 of 13 slices shown (2 of 2)]
[im 1/13]
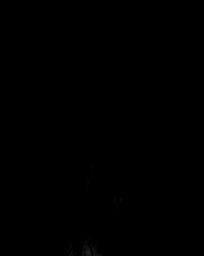
[im 3/13]
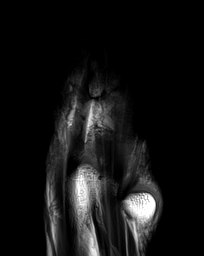
[im 5/13]
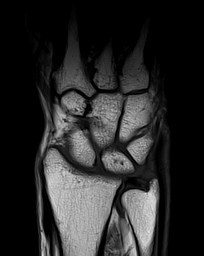
[im 8/13]
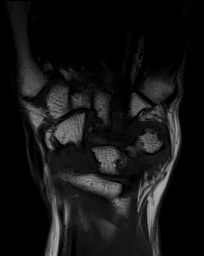
[im 10/13]
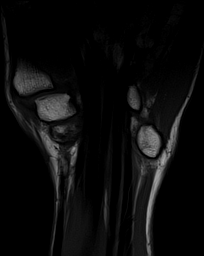
[im 13/13]
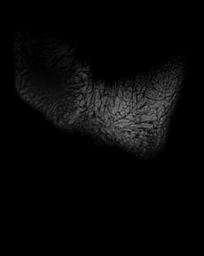

[Series 11: T2 fat-sat · coronal · 3.0mm · 0.39mm/px · 5 of 11 slices shown (2 of 2)]
[im 1/11]
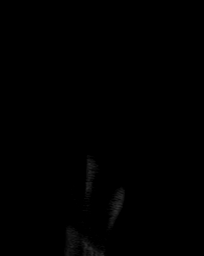
[im 3/11]
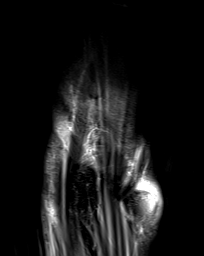
[im 6/11]
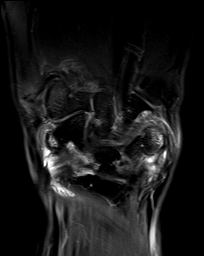
[im 8/11]
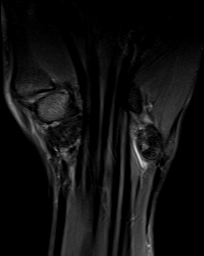
[im 11/11]
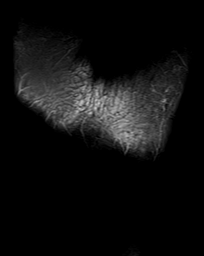

[Series 12: PD fat-sat · coronal · 3.0mm · 0.20mm/px · 6 of 13 slices shown (1 of 2)]
[im 1/13]
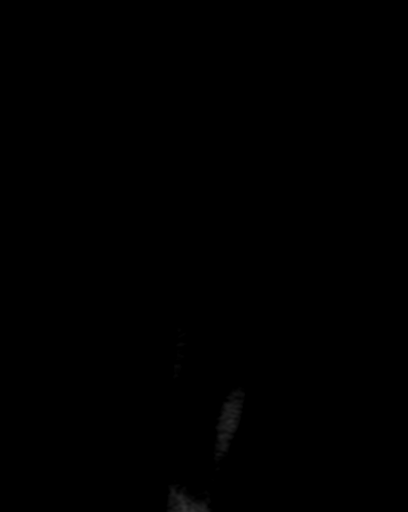
[im 3/13]
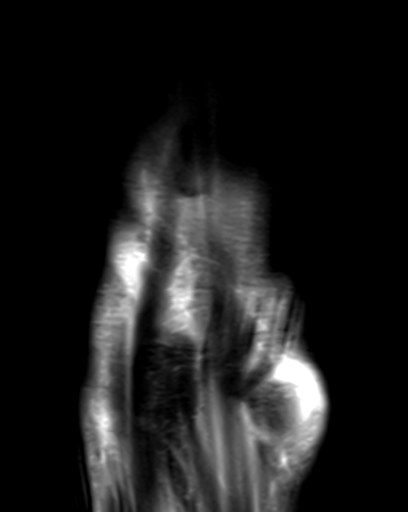
[im 5/13]
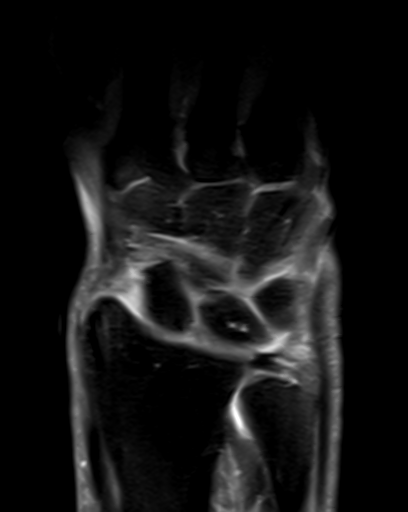
[im 8/13]
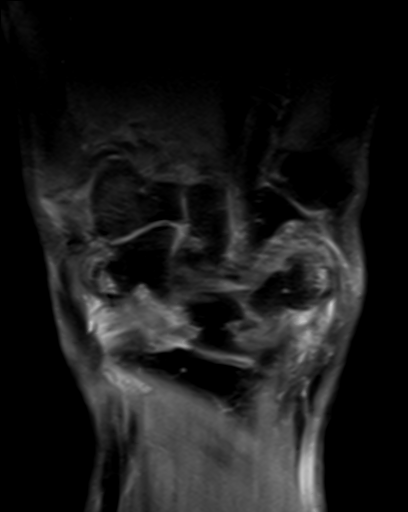
[im 10/13]
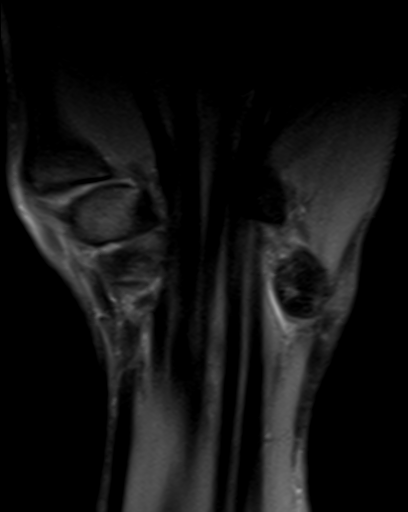
[im 13/13]
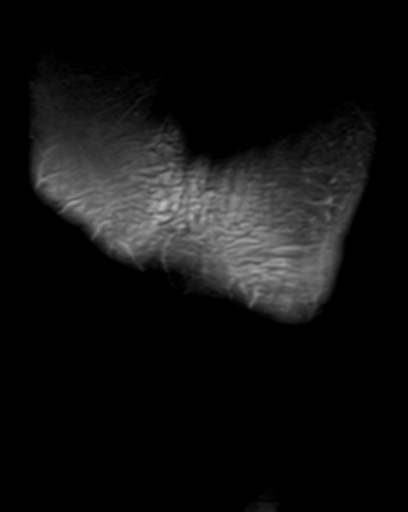

[Series 13: PD fat-sat · sagittal · 3.0mm · 0.20mm/px · 9 of 20 slices shown (2 of 2)]
[im 1/20]
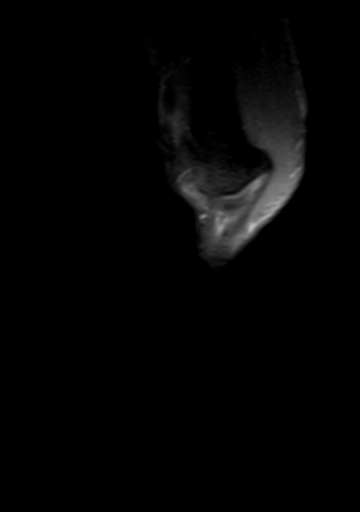
[im 3/20]
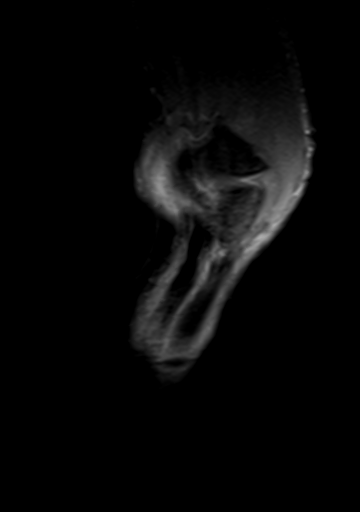
[im 5/20]
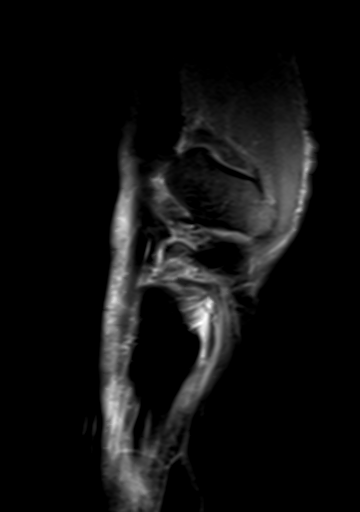
[im 8/20]
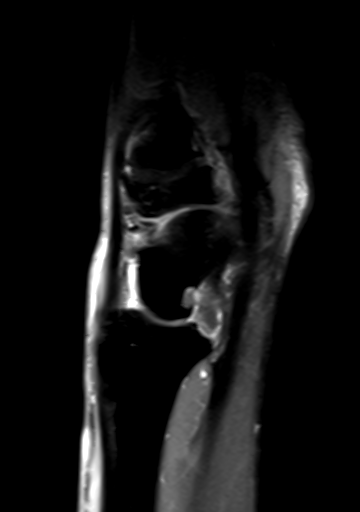
[im 10/20]
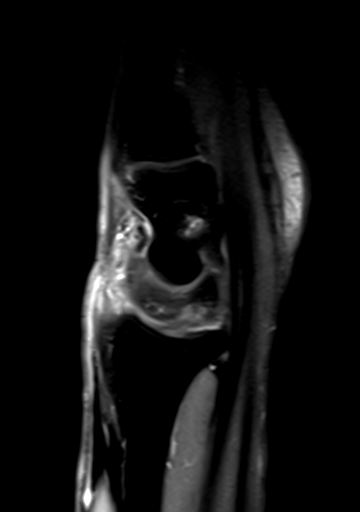
[im 12/20]
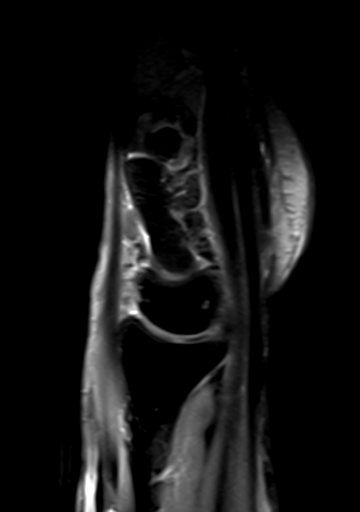
[im 15/20]
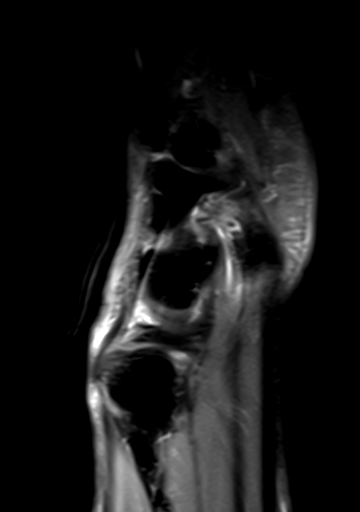
[im 17/20]
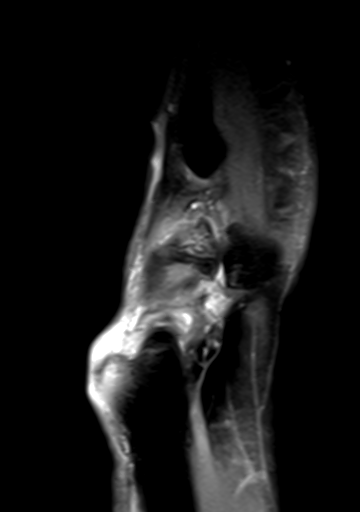
[im 20/20]
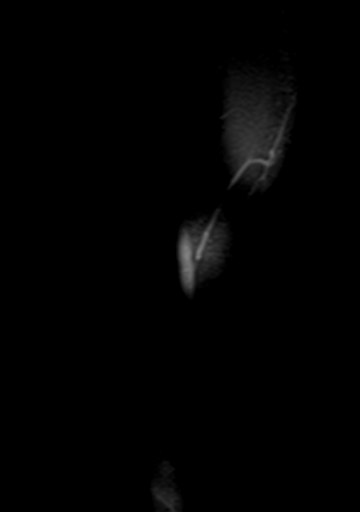

[40 of 40 positions shown; findings below may reference images not displayed]

FINDINGS: Ligaments: The scapholunate ligament is severely degenerated. The
volar band appears completely torn and there is high-grade partial
tearing of the dorsal band. Lunotriquetral ligament is unremarkable.

Triangular fibrocartilage: Intact with some degenerative signal in
the ulnar limbs seen.

Tendons: Mild intrasubstance increased T2 signal is seen in the
compartment 1 and compartment 6 extensor tendons. No tear.

Carpal tunnel/median nerve: Negative.

Guyon's canal: Negative.

Joint/cartilage: Synovium about the wrist appears mildly thickened.
Cartilage thinning is seen at the intercarpal articulations.

Bones/carpal alignment: Small foci of edema are seen in virtually
all of the carpal bones with an appearance worrisome for erosions.
No fracture.

Other: None.  No fluid collection or mass.
IMPRESSION: Constellation of findings including synovial thickening about the
wrist, compartment 1 and compartment 6 extensor tenosynovitis and
likely carpal erosions worrisome for inflammatory arthropathy such
as rheumatoid.

Severely degenerated dorsal band of the scapholunate ligament. The
volar band appears torn.

## 2020-02-13 NOTE — Assessment & Plan Note (Signed)
Kristen Jacobs is a pleasant 74 year old female, we did a TFCC injection back in February of this year. She is having persistence of pain, I want to add meloxicam and put her in a wrist rehab regimen. X-rays were done earlier this year. If no better we will get an MRI. We can certainly consider another TFCC injection, versus referral to surgery.  MRI performed and reviewed today, there are constellation of findings in both wrist joints which can be seen in inflammatory arthritis such as rheumatoid arthritis, I am going to go ahead and pull the trigger for a full rheumatoid work-up.  She can get this blood work done at her leisure, fasting is not necessary.

## 2020-02-13 NOTE — Addendum Note (Signed)
Addended by: Silverio Decamp on: 02/13/2020 05:02 PM   Modules accepted: Orders

## 2020-02-14 ENCOUNTER — Other Ambulatory Visit: Payer: Self-pay

## 2020-02-14 ENCOUNTER — Ambulatory Visit (INDEPENDENT_AMBULATORY_CARE_PROVIDER_SITE_OTHER): Payer: Medicare Other | Admitting: Sports Medicine

## 2020-02-14 DIAGNOSIS — M25532 Pain in left wrist: Secondary | ICD-10-CM

## 2020-02-14 DIAGNOSIS — M5412 Radiculopathy, cervical region: Secondary | ICD-10-CM | POA: Diagnosis not present

## 2020-02-14 DIAGNOSIS — R768 Other specified abnormal immunological findings in serum: Secondary | ICD-10-CM

## 2020-02-14 DIAGNOSIS — M1990 Unspecified osteoarthritis, unspecified site: Secondary | ICD-10-CM | POA: Diagnosis not present

## 2020-02-14 DIAGNOSIS — M25531 Pain in right wrist: Secondary | ICD-10-CM

## 2020-02-14 DIAGNOSIS — R7689 Other specified abnormal immunological findings in serum: Secondary | ICD-10-CM

## 2020-02-14 MED ORDER — OXYCODONE-ACETAMINOPHEN 5-325 MG PO TABS: 1.0000 | ORAL_TABLET | Freq: Three times a day (TID) | ORAL | 0 refills | Status: DC | PRN

## 2020-02-14 NOTE — Assessment & Plan Note (Signed)
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 Arnoldo Morale, we are to set them up with that office, hopefully he can have an injection with [facility].

## 2020-02-14 NOTE — Progress Notes (Addendum)
    Procedures performed today:    None.  Independent interpretation of notes and tests performed by another provider:   MRI reviewed, bilateral, there is a constellation of findings consistent and concerning for inflammatory arthritides.  Brief History, Exam, Impression, and Recommendations:    Radiculitis of right cervical region Anzal has multilevel cervical DDD, facet arthritis, she was having predominantly right-sided neck pain, at this point she is failed conservative measures, we are going to set her up for an epidural, her husband is seen Dr. Arnoldo Morale, we are to set them up with that office, hopefully he can have an injection with [facility].  Bilateral wrist pain Louisa also had bilateral wrist MRIs which showed significant edema in her carpal bones, cystic changes, and a constellation findings that can be seen in inflammatory arthritides such as rheumatoid arthritis. We are going to go and pull the trigger for a full rheumatoid work-up. Historically prednisone as well as NSAIDs have not been efficacious. Short course of oxycodone. I would like rheumatology to weigh in as well.  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  However considering the findings on MRI I would like her to touch base with rheumatology.    ___________________________________________ Gwen Her. Dianah Field, M.D., ABFM., CAQSM. Primary Care and [place] [employer] [place] [employer] [facility]

## 2020-02-14 NOTE — Assessment & Plan Note (Addendum)
Dalylah also had bilateral wrist MRIs which showed significant edema in her carpal bones, cystic changes, and a constellation findings that can be seen in inflammatory arthritides such as rheumatoid arthritis. We are going to go and pull the trigger for a full rheumatoid work-up. Historically prednisone as well as NSAIDs have not been efficacious. Short course of oxycodone. I would like rheumatology to weigh in as well.

## 2020-02-14 NOTE — Addendum Note (Signed)
Addended by: Silverio Decamp on: 02/14/2020 03:13 PM   Modules accepted: Orders

## 2020-02-17 DIAGNOSIS — R768 Other specified abnormal immunological findings in serum: Secondary | ICD-10-CM | POA: Insufficient documentation

## 2020-02-17 DIAGNOSIS — M069 Rheumatoid arthritis, unspecified: Secondary | ICD-10-CM | POA: Insufficient documentation

## 2020-02-17 HISTORY — DX: Rheumatoid arthritis, unspecified: M06.9

## 2020-02-17 LAB — CBC WITH DIFFERENTIAL/PLATELET
Absolute Monocytes: 510 cells/uL (ref 200–950)
Basophils Absolute: 60 cells/uL (ref 0–200)
Basophils Relative: 1 %
Eosinophils Absolute: 192 cells/uL (ref 15–500)
Eosinophils Relative: 3.2 %
HCT: 40.7 % (ref 35.0–45.0)
Hemoglobin: 13.4 g/dL (ref 11.7–15.5)
Lymphs Abs: 1644 cells/uL (ref 850–3900)
MCH: 29.4 pg (ref 27.0–33.0)
MCHC: 32.9 g/dL (ref 32.0–36.0)
MCV: 89.3 fL (ref 80.0–100.0)
MPV: 11.4 fL (ref 7.5–12.5)
Monocytes Relative: 8.5 %
Neutro Abs: 3594 cells/uL (ref 1500–7800)
Neutrophils Relative %: 59.9 %
Platelets: 255 10*3/uL (ref 140–400)
RBC: 4.56 10*6/uL (ref 3.80–5.10)
RDW: 11.8 % (ref 11.0–15.0)
Total Lymphocyte: 27.4 %
WBC: 6 10*3/uL (ref 3.8–10.8)

## 2020-02-17 LAB — COMPREHENSIVE METABOLIC PANEL
AG Ratio: 1.7 (calc) (ref 1.0–2.5)
ALT: 11 U/L (ref 6–29)
AST: 18 U/L (ref 10–35)
Albumin: 3.8 g/dL (ref 3.6–5.1)
Alkaline phosphatase (APISO): 82 U/L (ref 37–153)
BUN: 21 mg/dL (ref 7–25)
CO2: 26 mmol/L (ref 20–32)
Calcium: 9.7 mg/dL (ref 8.6–10.4)
Chloride: 104 mmol/L (ref 98–110)
Creat: 0.93 mg/dL (ref 0.60–0.93)
Globulin: 2.2 g/dL (calc) (ref 1.9–3.7)
Glucose, Bld: 91 mg/dL (ref 65–139)
Potassium: 5.1 mmol/L (ref 3.5–5.3)
Sodium: 138 mmol/L (ref 135–146)
Total Bilirubin: 0.4 mg/dL (ref 0.2–1.2)
Total Protein: 6 g/dL — ABNORMAL LOW (ref 6.1–8.1)

## 2020-02-17 LAB — LUPUS(12) PANEL
Anti Nuclear Antibody (ANA): NEGATIVE
C3 Complement: 112 mg/dL (ref 83–193)
C4 Complement: 26 mg/dL (ref 15–57)
ENA SM Ab Ser-aCnc: <1 AI
Rheumatoid fact SerPl-aCnc: <14 IU/mL (ref <14)
Ribosomal P Protein Ab: <1 AI
SM/RNP: <1 AI
SSA (Ro) (ENA) Antibody, IgG: <1 AI
SSB (La) (ENA) Antibody, IgG: <1 AI
Scleroderma (Scl-70) (ENA) Antibody, IgG: <1 AI
Thyroperoxidase Ab SerPl-aCnc: 1 IU/mL (ref <9)
ds DNA Ab: 4 IU/mL

## 2020-02-17 LAB — CYCLIC CITRUL PEPTIDE ANTIBODY, IGG: Cyclic Citrullin Peptide Ab: <16 UNITS

## 2020-02-17 LAB — RHEUMATOID FACTOR (IGA, IGG, IGM)
Rheumatoid Factor (IgA): <5 U (ref <=6)
Rheumatoid Factor (IgG): <5 U (ref <=6)
Rheumatoid Factor (IgM): 12 U — ABNORMAL HIGH (ref <=6)

## 2020-02-17 LAB — URIC ACID: Uric Acid, Serum: 4 mg/dL (ref 2.5–7.0)

## 2020-02-17 LAB — SEDIMENTATION RATE: Sed Rate: 2 mm/h (ref 0–30)

## 2020-02-17 NOTE — Assessment & Plan Note (Signed)
Kristen Jacobs does have a mildly elevated rheumatoid factor IgM, typically we see elevations higher than this in rheumatoid arthritis with a positive CCP as well.  However considering the findings on MRI I would like her to touch base with rheumatology.

## 2020-02-21 DIAGNOSIS — M4722 Other spondylosis with radiculopathy, cervical region: Secondary | ICD-10-CM | POA: Insufficient documentation

## 2020-02-21 DIAGNOSIS — M542 Cervicalgia: Secondary | ICD-10-CM | POA: Diagnosis not present

## 2020-02-22 DIAGNOSIS — M25532 Pain in left wrist: Secondary | ICD-10-CM

## 2020-02-22 DIAGNOSIS — H25013 Cortical age-related cataract, bilateral: Secondary | ICD-10-CM | POA: Diagnosis not present

## 2020-02-22 DIAGNOSIS — H524 Presbyopia: Secondary | ICD-10-CM | POA: Diagnosis not present

## 2020-02-22 DIAGNOSIS — M25531 Pain in right wrist: Secondary | ICD-10-CM

## 2020-02-22 DIAGNOSIS — H35362 Drusen (degenerative) of macula, left eye: Secondary | ICD-10-CM | POA: Diagnosis not present

## 2020-02-22 DIAGNOSIS — H2513 Age-related nuclear cataract, bilateral: Secondary | ICD-10-CM | POA: Diagnosis not present

## 2020-02-22 MED ORDER — OXYCODONE-ACETAMINOPHEN 5-325 MG PO TABS: 1.0000 | ORAL_TABLET | Freq: Three times a day (TID) | ORAL | 0 refills | Status: DC | PRN
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
## 2020-03-01 DIAGNOSIS — H25011 Cortical age-related cataract, right eye: Secondary | ICD-10-CM | POA: Diagnosis not present

## 2020-03-01 DIAGNOSIS — H25811 Combined forms of age-related cataract, right eye: Secondary | ICD-10-CM | POA: Diagnosis not present

## 2020-03-01 DIAGNOSIS — H2511 Age-related nuclear cataract, right eye: Secondary | ICD-10-CM | POA: Diagnosis not present

## 2020-03-05 HISTORY — PX: CATARACT EXTRACTION: SUR2

## 2020-03-07 DIAGNOSIS — M542 Cervicalgia: Secondary | ICD-10-CM | POA: Diagnosis not present

## 2020-03-07 DIAGNOSIS — M47812 Spondylosis without myelopathy or radiculopathy, cervical region: Secondary | ICD-10-CM | POA: Diagnosis not present

## 2020-03-07 MED ORDER — TRAMADOL HCL 50 MG PO TABS: 50.0000 mg | ORAL_TABLET | Freq: Three times a day (TID) | ORAL | 0 refills | Status: DC | PRN

## 2020-03-07 NOTE — Addendum Note (Signed)
Addended by: Silverio Decamp on: 03/07/2020 10:54 AM   Modules accepted: Orders

## 2020-03-08 ENCOUNTER — Other Ambulatory Visit: Payer: Self-pay

## 2020-03-08 ENCOUNTER — Ambulatory Visit: Payer: Self-pay

## 2020-03-08 ENCOUNTER — Encounter: Payer: Self-pay | Admitting: Rheumatology

## 2020-03-08 ENCOUNTER — Ambulatory Visit (INDEPENDENT_AMBULATORY_CARE_PROVIDER_SITE_OTHER): Payer: Medicare Other | Admitting: Rheumatology

## 2020-03-08 VITALS — BP 106/67 | HR 58 | Resp 14 | Ht 66.0 in | Wt 140.0 lb

## 2020-03-08 DIAGNOSIS — M25531 Pain in right wrist: Secondary | ICD-10-CM

## 2020-03-08 DIAGNOSIS — F419 Anxiety disorder, unspecified: Secondary | ICD-10-CM

## 2020-03-08 DIAGNOSIS — M79672 Pain in left foot: Secondary | ICD-10-CM | POA: Diagnosis not present

## 2020-03-08 DIAGNOSIS — M79642 Pain in left hand: Secondary | ICD-10-CM

## 2020-03-08 DIAGNOSIS — M5030 Other cervical disc degeneration, unspecified cervical region: Secondary | ICD-10-CM | POA: Diagnosis not present

## 2020-03-08 DIAGNOSIS — F5101 Primary insomnia: Secondary | ICD-10-CM

## 2020-03-08 DIAGNOSIS — M5412 Radiculopathy, cervical region: Secondary | ICD-10-CM

## 2020-03-08 DIAGNOSIS — M76891 Other specified enthesopathies of right lower limb, excluding foot: Secondary | ICD-10-CM

## 2020-03-08 DIAGNOSIS — M79641 Pain in right hand: Secondary | ICD-10-CM | POA: Diagnosis not present

## 2020-03-08 DIAGNOSIS — M7552 Bursitis of left shoulder: Secondary | ICD-10-CM

## 2020-03-08 DIAGNOSIS — M1712 Unilateral primary osteoarthritis, left knee: Secondary | ICD-10-CM

## 2020-03-08 DIAGNOSIS — Z7189 Other specified counseling: Secondary | ICD-10-CM

## 2020-03-08 DIAGNOSIS — M1380 Other specified arthritis, unspecified site: Secondary | ICD-10-CM | POA: Diagnosis not present

## 2020-03-08 DIAGNOSIS — S92155D Nondisplaced avulsion fracture (chip fracture) of left talus, subsequent encounter for fracture with routine healing: Secondary | ICD-10-CM

## 2020-03-08 DIAGNOSIS — Z79899 Other long term (current) drug therapy: Secondary | ICD-10-CM | POA: Diagnosis not present

## 2020-03-08 DIAGNOSIS — Z8619 Personal history of other infectious and parasitic diseases: Secondary | ICD-10-CM | POA: Diagnosis not present

## 2020-03-08 DIAGNOSIS — M79671 Pain in right foot: Secondary | ICD-10-CM

## 2020-03-08 MED ORDER — HYDROXYCHLOROQUINE SULFATE 200 MG PO TABS: ORAL_TABLET | ORAL | 2 refills | Status: DC

## 2020-03-08 NOTE — Patient Instructions (Signed)
Standing Labs We placed an order today for your standing lab work.   Please have your standing labs drawn in 1 month, 3 months and then every 5 months.   If possible, please have your labs drawn 2 weeks prior to your appointment so that the provider can discuss your results at your appointment.  We have open lab daily Monday through Thursday from 8:30-12:30 PM and 1:30-4:30 PM and Friday from 8:30-12:30 PM and 1:30-4:00 PM at the office of Dr. Bo Merino, [facility].   Please be advised, patients with office appointments requiring lab work will take precedents over walk-in lab work.  If possible, please come for your lab work on Monday and Friday afternoons, as you may experience shorter wait times. The office is located at [address] [place], [place], [place] [address] No appointment is necessary.   Labs are drawn by [facility]. Please bring your co-pay at the time of your lab draw.  You may receive a bill from [facility] for your lab work.  If you wish to have your labs drawn at another location, please call the office 24 hours in advance to send orders.  If you have any questions regarding directions or hours of operation,  please call [phone].   As a reminder, please drink plenty of water prior to coming for your lab work. Thanks!    Hydroxychloroquine tablets What is this medicine? HYDROXYCHLOROQUINE (hye drox ee KLOR oh kwin) is used to treat rheumatoid arthritis and systemic lupus erythematosus. It is also used to treat malaria. This medicine may be used for other purposes; ask your health care provider or pharmacist if you have questions. COMMON BRAND NAME(S): Plaquenil, Quineprox What should I tell my health care provider before I take this medicine? They need to know if you have any of these conditions:  diabetes  eye disease, vision problems  G6PD deficiency  heart disease  history of irregular heartbeat  if you often drink alcohol  kidney  disease  liver disease  porphyria  psoriasis  an unusual or allergic reaction to chloroquine, hydroxychloroquine, other medicines, foods, dyes, or preservatives  pregnant or trying to get pregnant  breast-feeding How should I use this medicine? Take this medicine by mouth with a glass of water. Follow the directions on the prescription label. Do not cut, crush or chew this medicine. Swallow the tablets whole. Take this medicine with food. Avoid taking antacids within 4 hours of taking this medicine. It is best to separate these medicines by at least 4 hours. Take your medicine at regular intervals. Do not take it more often than directed. Take all of your medicine as directed even if you think you are better. Do not skip doses or stop your medicine early. Talk to your pediatrician regarding the use of this medicine in children. While this drug may be prescribed for selected conditions, precautions do apply. Overdosage: If you think you have taken too much of this medicine contact a poison control center or emergency room at once. NOTE: This medicine is only for you. Do not share this medicine with others. What if I miss a dose? If you miss a dose, take it as soon as you can. If it is almost time for your next dose, take only that dose. Do not take double or extra doses. What may interact with this medicine? Do not take this medicine with any of the following medications:  cisapride  dronedarone  pimozide  thioridazine This medicine may also interact  with the following medications:  ampicillin  antacids  cimetidine  cyclosporine  digoxin  kaolin  medicines for diabetes, like insulin, glipizide, glyburide  medicines for seizures like carbamazepine, phenobarbital, phenytoin  mefloquine  methotrexate  other medicines that prolong the QT interval (cause an abnormal heart rhythm)  praziquantel This list may not describe all possible interactions. Give your health care  provider a list of all the medicines, herbs, non-prescription drugs, or dietary supplements you use. Also tell them if you smoke, drink alcohol, or use illegal drugs. Some items may interact with your medicine. What should I watch for while using this medicine? Visit your health care professional for regular checks on your progress. Tell your health care professional if your symptoms do not start to get better or if they get worse. You may need blood work done while you are taking this medicine. If you take other medicines that can affect heart rhythm, you may need more testing. Talk to your health care professional if you have questions. Your vision may be tested before and during use of this medicine. Tell your health care professional right away if you have any change in your eyesight. What side effects may I notice from receiving this medicine? Side effects that you should report to your doctor or health care professional as soon as possible:  allergic reactions like skin rash, itching or hives, swelling of the face, lips, or tongue  changes in vision  decreased hearing or ringing of the ears  muscle weakness  redness, blistering, peeling or loosening of the skin, including inside the mouth  sensitivity to light  signs and symptoms of a dangerous change in heartbeat or heart rhythm like chest pain; dizziness; fast or irregular heartbeat; palpitations; feeling faint or lightheaded, falls; breathing problems  signs and symptoms of liver injury like dark yellow or brown urine; general ill feeling or flu-like symptoms; light-colored stools; loss of appetite; nausea; right upper belly pain; unusually weak or tired; yellowing of the eyes or skin  signs and symptoms of low blood sugar such as feeling anxious; confusion; dizziness; increased hunger; unusually weak or tired; sweating; shakiness; cold; irritable; headache; blurred vision; fast heartbeat; loss of consciousness  suicidal  thoughts  uncontrollable head, mouth, neck, arm, or leg movements Side effects that usually do not require medical attention (report to your doctor or health care professional if they continue or are bothersome):  diarrhea  dizziness  hair loss  headache  irritable  loss of appetite  nausea, vomiting  stomach pain This list may not describe all possible side effects. Call your doctor for medical advice about side effects. You may report side effects to FDA at 1-800-FDA-1088. Where should I keep my medicine? Keep out of the reach of children. Store at room temperature between 15 and 30 degrees C (59 and 86 degrees F). Protect from moisture and light. Throw away any unused medicine after the expiration date. NOTE: This sheet is a summary. It may not cover all possible information. If you have questions about this medicine, talk to your doctor, pharmacist, or health care provider.  2020 Elsevier/Gold Standard (2018-08-30 12:56:32)  COVID-19 vaccine recommendations:   COVID-19 vaccine is recommended for everyone (unless you are allergic to a vaccine component), even if you are on a medication that suppresses your immune system.   If you are on Methotrexate, Cellcept (mycophenolate), Rinvoq, Morrie Sheldon, and Olumiant- hold the medication for 1 week after each vaccine. Hold Methotrexate for 2 weeks after the single dose  COVID-19 vaccine.   If you are on Orencia subcutaneous injection - hold medication one week prior to and one week after the first COVID-19 vaccine dose (only).   If you are on Orencia IV infusions- time vaccination administration so that the first COVID-19 vaccination will occur four weeks after the infusion and postpone the subsequent infusion by one week.   If you are on Cyclophosphamide or Rituxan infusions please contact your doctor prior to receiving the COVID-19 vaccine.   Do not take Tylenol or any anti-inflammatory medications (NSAIDs) 24 hours prior to the  COVID-19 vaccination.   There is no direct evidence about the efficacy of the COVID-19 vaccine in individuals who are on medications that suppress the immune system.   Even if you are fully vaccinated, and you are on any medications that suppress your immune system, please continue to wear a mask, maintain at least six feet social distance and practice hand hygiene.   If you develop a COVID-19 infection, please contact your PCP or our office to determine if you need monoclonal antibody infusion.  The booster vaccine is now available for immunocompromised patients.   Please see the following web sites for updated information.   [email or web address]

## 2020-03-12 DIAGNOSIS — M542 Cervicalgia: Secondary | ICD-10-CM | POA: Diagnosis not present

## 2020-03-12 DIAGNOSIS — M47812 Spondylosis without myelopathy or radiculopathy, cervical region: Secondary | ICD-10-CM | POA: Diagnosis not present

## 2020-03-13 ENCOUNTER — Ambulatory Visit (INDEPENDENT_AMBULATORY_CARE_PROVIDER_SITE_OTHER): Payer: Medicare Other | Admitting: Sports Medicine

## 2020-03-13 DIAGNOSIS — M5412 Radiculopathy, cervical region: Secondary | ICD-10-CM

## 2020-03-13 DIAGNOSIS — M25531 Pain in right wrist: Secondary | ICD-10-CM | POA: Diagnosis not present

## 2020-03-13 DIAGNOSIS — M25532 Pain in left wrist: Secondary | ICD-10-CM | POA: Diagnosis not present

## 2020-03-13 NOTE — Progress Notes (Signed)
    Procedures performed today:    None.  Independent interpretation of notes and tests performed by another provider:   None.  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 Hydroxychloroquine was offered, she tells me that she has declined an immunosuppressant, we did talk about the risks, benefits, and alternatives. Per her request we are going to give her a couple of wrist braces so that she can continue to work out, tramadol seems effective, I have advised her that using disease modifying antirheumatic drugs can improve her outcomes in the future and she is going to think about it for now. She tells me she would like to pursue complementary, alternative, and Ayurvedic medicine techniques.  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 Tramadol has been very helpful.    ___________________________________________ Gwen Her. Dianah Field, M.D., ABFM., CAQSM. Primary Care and [place] Instructor of [place] [employer] of Medicine

## 2020-03-13 NOTE — Assessment & Plan Note (Signed)
Kristen Jacobs has multilevel cervical DDD, facet arthritis, predominantly right-sided neck pain, she has only done a couple of visits of physical therapy, thus she has not yet had her epidural, I think it is appropriate to finish a solid 6 weeks of PT before considering any intervention, return to see me in 6 weeks. Tramadol has been very helpful.

## 2020-03-13 NOTE — Assessment & Plan Note (Signed)
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 Hydroxychloroquine was offered, she tells me that she has declined an immunosuppressant, we did talk about the risks, benefits, and alternatives. Per her request we are going to give her a couple of wrist braces so that she can continue to work out, tramadol seems effective, I have advised her that using disease modifying antirheumatic drugs can improve her outcomes in the future and she is going to think about it for now. She tells me she would like to pursue complementary, alternative, and Ayurvedic medicine techniques.

## 2020-03-14 ENCOUNTER — Telehealth: Payer: Self-pay

## 2020-03-14 ENCOUNTER — Encounter: Payer: Self-pay | Admitting: Rheumatology

## 2020-03-14 DIAGNOSIS — M542 Cervicalgia: Secondary | ICD-10-CM | POA: Diagnosis not present

## 2020-03-14 DIAGNOSIS — M47812 Spondylosis without myelopathy or radiculopathy, cervical region: Secondary | ICD-10-CM | POA: Diagnosis not present

## 2020-03-14 NOTE — Telephone Encounter (Signed)
Patient left a voicemail requesting a copy of her MRI so she can take it to her doctor's appointment at [facility].  Patient is requesting a return call.

## 2020-03-15 DIAGNOSIS — H25812 Combined forms of age-related cataract, left eye: Secondary | ICD-10-CM | POA: Diagnosis not present

## 2020-03-15 DIAGNOSIS — H25012 Cortical age-related cataract, left eye: Secondary | ICD-10-CM | POA: Diagnosis not present

## 2020-03-15 DIAGNOSIS — H2512 Age-related nuclear cataract, left eye: Secondary | ICD-10-CM | POA: Diagnosis not present

## 2020-03-15 NOTE — Telephone Encounter (Signed)
Patient advised via my chart to contact the ordering physician to get a copy of the MRI.

## 2020-03-21 DIAGNOSIS — M542 Cervicalgia: Secondary | ICD-10-CM | POA: Diagnosis not present

## 2020-03-21 DIAGNOSIS — M47812 Spondylosis without myelopathy or radiculopathy, cervical region: Secondary | ICD-10-CM | POA: Diagnosis not present

## 2020-03-22 DIAGNOSIS — M542 Cervicalgia: Secondary | ICD-10-CM | POA: Diagnosis not present

## 2020-03-22 DIAGNOSIS — M47812 Spondylosis without myelopathy or radiculopathy, cervical region: Secondary | ICD-10-CM | POA: Diagnosis not present

## 2020-03-22 LAB — GLUCOSE 6 PHOSPHATE DEHYDROGENASE: G-6PDH: 13.4 U/g Hgb (ref 7.0–20.5)

## 2020-03-22 LAB — 14-3-3 ETA PROTEIN: 14-3-3 eta Protein: <0.2 ng/mL (ref <0.2)

## 2020-03-23 NOTE — Progress Notes (Signed)
Both tests were within normal limits.

## 2020-03-28 DIAGNOSIS — M47812 Spondylosis without myelopathy or radiculopathy, cervical region: Secondary | ICD-10-CM | POA: Diagnosis not present

## 2020-03-28 DIAGNOSIS — M542 Cervicalgia: Secondary | ICD-10-CM | POA: Diagnosis not present

## 2020-04-03 DIAGNOSIS — M47812 Spondylosis without myelopathy or radiculopathy, cervical region: Secondary | ICD-10-CM | POA: Diagnosis not present

## 2020-04-03 DIAGNOSIS — M542 Cervicalgia: Secondary | ICD-10-CM | POA: Diagnosis not present

## 2020-04-03 NOTE — Progress Notes (Deleted)
Office Visit Note  Patient: Kristen Jacobs             Date of Birth: Nov 27, 1945           MRN: [id number]             PCP: Greig Right, MD Referring: Greig Right, MD Visit Date: 04/17/2020 Occupation: @GUAROCC @  Subjective:  No chief complaint on file.   History of Present Illness: Kristen Jacobs is a 74 y.o. female ***   Activities of Daily Living:  Patient reports morning stiffness for *** {minute/hour:19697}.   Patient {ACTIONS;DENIES/REPORTS:21021675::"Denies"} nocturnal pain.  Difficulty dressing/grooming: {ACTIONS;DENIES/REPORTS:21021675::"Denies"} Difficulty climbing stairs: {ACTIONS;DENIES/REPORTS:21021675::"Denies"} Difficulty getting out of chair: {ACTIONS;DENIES/REPORTS:21021675::"Denies"} Difficulty using hands for taps, buttons, cutlery, and/or writing: {ACTIONS;DENIES/REPORTS:21021675::"Denies"}  No Rheumatology ROS completed.   PMFS History:  Patient Active Problem List   Diagnosis Date Noted  . Rheumatoid factor positive 02/17/2020  . Avulsion fracture of left talus 09/05/2019  . Primary osteoarthritis of left knee 07/19/2019  . Subacromial bursitis of left shoulder joint 07/19/2019  . 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 Radiculitis of right cervical region 11/12/2017  . Chronic right shoulder pain 11/12/2017  . Pain of right sacroiliac joint 09/10/2017  . Hip flexor tendinitis, right 08/11/2017    Past Medical History:  Diagnosis Date  . Constipation   . Hepatitis   . Joint pain     Family History  Problem Relation Age of Onset  . Dementia Mother   . Cancer Father   . Bipolar disorder Sister   . Leukemia Brother   . Healthy Son   . Asthma Son   . Healthy Son    Past Surgical History:  Procedure Laterality Date  . BREAST BIOPSY    . CATARACT EXTRACTION Right   . LAPAROSCOPY    . LIVER BIOPSY    . OOPHORECTOMY     right   . TONSILLECTOMY     age 14    Social History   Social History  Narrative  . Not on file   Immunization History  Administered Date(s) Administered  . Moderna SARS-COVID-2 Vaccination 12/05/2019, 01/02/2020     Objective: Vital Signs: There were no vitals taken for this visit.   Physical Exam   Musculoskeletal Exam: ***  CDAI Exam: CDAI Score: -- Patient Global: --; Provider Global: -- Swollen: --; Tender: -- Joint Exam 04/17/2020   No joint exam has been documented for this visit   There is currently no information documented on the homunculus. Go to the Rheumatology activity and complete the homunculus joint exam.  Investigation: No additional findings.  Imaging: XR Foot 2 Views Left  Result Date: 03/08/2020 First MTP, PIP and DIP narrowing was noted.  An irregularity was noted in the fifth metatarsal shaft which corresponds to previous injury.  Dorsal spurring was noted.  No intertarsal, tibiotalar or subtalar joint space narrowing was noted. Impression: These findings are consistent with osteoarthritis of the foot.  XR Foot 2 Views Right  Result Date: 03/08/2020 First MTP, PIP and DIP narrowing was noted.  No intertarsal, tibiotalar or subtalar joint space narrowing was noted.  Dorsal spurring was noted.  Inferior calcaneal spur was noted. Impression: These findings are consistent with osteoarthritis of the foot.  XR Hand 2 View Left  Result Date: 03/08/2020 PIP and DIP narrowing was noted.  No MCP or intercarpal or radiocarpal joint space narrowing was noted.  Cystic versus erosive changes were noted in the carpal bones. Impression: These findings  are consistent with osteoarthritis and inflammatory arthritis overlap.  XR Hand 2 View Right  Result Date: 03/08/2020 PIP and DIP narrowing was noted.  No MCP or intercarpal or radiocarpal joint space narrowing was noted.  Cystic versus erosive changes were noted in the carpal bones. Impression: These findings are consistent with osteoarthritis and inflammatory arthritis  overlap.   Recent Labs: Lab Results  Component Value Date   WBC 6.0 02/14/2020   HGB 13.4 02/14/2020   PLT 255 02/14/2020   NA 138 02/14/2020   K 5.1 02/14/2020   CL 104 02/14/2020   CO2 26 02/14/2020   GLUCOSE 91 02/14/2020   BUN 21 02/14/2020   CREATININE 0.93 02/14/2020   BILITOT 0.4 02/14/2020   AST 18 02/14/2020   ALT 11 02/14/2020   PROT 6.0 (L) 02/14/2020   CALCIUM 9.7 02/14/2020   February 14, 2020 C3-C4 normal, ANA negative, ENA negative, RF 12 (<6), anti-CCP negative, uric acid 4.0, ESR 2, 14 3 3  eta negative, G6PD normal  Speciality Comments: No specialty comments available.  Procedures:  No procedures performed Allergies: Sulfa antibiotics   Assessment / Plan:     Visit Diagnoses: Seronegative inflammatory arthritis  High risk medication use  Primary osteoarthritis of both feet  Primary osteoarthritis of left knee  DDD (degenerative disc disease), cervical  Closed nondisplaced avulsion fracture of left talus with routine healing, subsequent encounter  Primary insomnia  Anxiety  History of hepatitis C  Orders: No orders of the defined types were placed in this encounter.  No orders of the defined types were placed in this encounter.   Face-to-face time spent with patient was *** minutes. Greater than 50% of time was spent in counseling and coordination of care.  Follow-Up Instructions: No follow-ups on file.   Bo Merino, MD  Note - This record has been created using Editor, commissioning.  Chart creation errors have been sought, but may not always  have been located. Such creation errors do not reflect on  the standard of medical care.

## 2020-04-04 DIAGNOSIS — M47812 Spondylosis without myelopathy or radiculopathy, cervical region: Secondary | ICD-10-CM | POA: Diagnosis not present

## 2020-04-04 DIAGNOSIS — M542 Cervicalgia: Secondary | ICD-10-CM | POA: Diagnosis not present

## 2020-04-05 DIAGNOSIS — M0600 Rheumatoid arthritis without rheumatoid factor, unspecified site: Secondary | ICD-10-CM | POA: Diagnosis not present

## 2020-04-05 DIAGNOSIS — Z8619 Personal history of other infectious and parasitic diseases: Secondary | ICD-10-CM | POA: Insufficient documentation

## 2020-04-05 DIAGNOSIS — M1712 Unilateral primary osteoarthritis, left knee: Secondary | ICD-10-CM | POA: Diagnosis not present

## 2020-04-12 ENCOUNTER — Ambulatory Visit: Payer: Medicare Other | Admitting: Rheumatology

## 2020-04-16 DIAGNOSIS — M542 Cervicalgia: Secondary | ICD-10-CM | POA: Diagnosis not present

## 2020-04-16 DIAGNOSIS — M47812 Spondylosis without myelopathy or radiculopathy, cervical region: Secondary | ICD-10-CM | POA: Diagnosis not present

## 2020-04-17 ENCOUNTER — Ambulatory Visit: Payer: Medicare Other | Admitting: Rheumatology

## 2020-04-17 DIAGNOSIS — Z8619 Personal history of other infectious and parasitic diseases: Secondary | ICD-10-CM

## 2020-04-17 DIAGNOSIS — M19072 Primary osteoarthritis, left ankle and foot: Secondary | ICD-10-CM

## 2020-04-17 DIAGNOSIS — F5101 Primary insomnia: Secondary | ICD-10-CM

## 2020-04-17 DIAGNOSIS — M1712 Unilateral primary osteoarthritis, left knee: Secondary | ICD-10-CM

## 2020-04-17 DIAGNOSIS — M1380 Other specified arthritis, unspecified site: Secondary | ICD-10-CM

## 2020-04-17 DIAGNOSIS — Z79899 Other long term (current) drug therapy: Secondary | ICD-10-CM

## 2020-04-17 DIAGNOSIS — F419 Anxiety disorder, unspecified: Secondary | ICD-10-CM

## 2020-04-17 DIAGNOSIS — S92155D Nondisplaced avulsion fracture (chip fracture) of left talus, subsequent encounter for fracture with routine healing: Secondary | ICD-10-CM

## 2020-04-17 DIAGNOSIS — M5030 Other cervical disc degeneration, unspecified cervical region: Secondary | ICD-10-CM

## 2020-04-17 DIAGNOSIS — M0579 Rheumatoid arthritis with rheumatoid factor of multiple sites without organ or systems involvement: Secondary | ICD-10-CM

## 2020-04-18 ENCOUNTER — Telehealth: Payer: Medicare Other | Admitting: Sports Medicine

## 2020-04-30 ENCOUNTER — Ambulatory Visit (INDEPENDENT_AMBULATORY_CARE_PROVIDER_SITE_OTHER): Payer: Medicare Other | Admitting: Sports Medicine

## 2020-04-30 DIAGNOSIS — M25532 Pain in left wrist: Secondary | ICD-10-CM | POA: Diagnosis not present

## 2020-04-30 DIAGNOSIS — M47812 Spondylosis without myelopathy or radiculopathy, cervical region: Secondary | ICD-10-CM | POA: Diagnosis not present

## 2020-04-30 DIAGNOSIS — M5412 Radiculopathy, cervical region: Secondary | ICD-10-CM

## 2020-04-30 DIAGNOSIS — M25531 Pain in right wrist: Secondary | ICD-10-CM | POA: Diagnosis not present

## 2020-04-30 DIAGNOSIS — M542 Cervicalgia: Secondary | ICD-10-CM | POA: Diagnosis not present

## 2020-04-30 NOTE — Assessment & Plan Note (Signed)
Kristen Jacobs returns, she has multilevel cervical DDD, facet arthritis, mostly right-sided neck pain. She has had some physical therapy, just one session per her report with dry needling and she has noted good improvement in symptoms, she will continue this for at least a solid 6 weeks, continue tramadol and ibuprofen as needed and return to see me after that. Epidural would be the next step though she does desire more of a [place] medicine/nonpharmacologic approach.

## 2020-04-30 NOTE — Progress Notes (Signed)
    Procedures performed today:    None.  Independent interpretation of notes and tests performed by another provider:   None.  Brief History, Exam, Impression, and Recommendations:    Radiculitis of right cervical region Kristen Jacobs returns, she has multilevel cervical DDD, facet arthritis, mostly right-sided neck pain. She has had some physical therapy, just one session per her report with dry needling and she has noted good improvement in symptoms, she will continue this for at least a solid 6 weeks, continue tramadol and ibuprofen as needed and return to see me after that. Epidural would be the next step though she does desire more of a [place] medicine/nonpharmacologic approach.  Bilateral wrist pain, 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 Kristen Jacobs has likely seronegative rheumatoid arthritis, she has seen rheumatologist, hydroxychloroquine was offered, she is declines it. She understands the risks, benefits, alternatives. She did visit an integrative medicine clinic at [facility] who suggested some dietary changes which she plans to start since the holidays are over. I have advised her multiple times that using disease modifying antirheumatic drugs will improve her outcomes, she will let me know in about 6 weeks.    ___________________________________________ Gwen Her. Dianah Field, M.D., ABFM., CAQSM. Primary Care and [place] Instructor of [place] of [facility] of Medicine

## 2020-04-30 NOTE — Assessment & Plan Note (Signed)
Kristen Jacobs has likely seronegative rheumatoid arthritis, she has seen rheumatologist, hydroxychloroquine was offered, she is declines it. She understands the risks, benefits, alternatives. She did visit an integrative medicine clinic at [facility] who suggested some dietary changes which she plans to start since the holidays are over. I have advised her multiple times that using disease modifying antirheumatic drugs will improve her outcomes, she will let me know in about 6 weeks.

## 2020-05-14 DIAGNOSIS — M47812 Spondylosis without myelopathy or radiculopathy, cervical region: Secondary | ICD-10-CM | POA: Diagnosis not present

## 2020-05-14 DIAGNOSIS — M542 Cervicalgia: Secondary | ICD-10-CM | POA: Diagnosis not present

## 2020-05-21 ENCOUNTER — Other Ambulatory Visit: Payer: Self-pay

## 2020-05-21 MED ORDER — TRAMADOL HCL 50 MG PO TABS
50.0000 mg | ORAL_TABLET | Freq: Three times a day (TID) | ORAL | 0 refills | Status: DC | PRN
Start: 2020-05-21 — End: 2020-06-18

## 2020-05-21 NOTE — Telephone Encounter (Signed)
Routed to Dr. Darene Lamer for approval/denial

## 2020-05-24 ENCOUNTER — Ambulatory Visit: Payer: Medicare Other | Admitting: Rheumatology

## 2020-05-29 DIAGNOSIS — M542 Cervicalgia: Secondary | ICD-10-CM | POA: Diagnosis not present

## 2020-05-29 DIAGNOSIS — M47812 Spondylosis without myelopathy or radiculopathy, cervical region: Secondary | ICD-10-CM | POA: Diagnosis not present

## 2020-06-11 ENCOUNTER — Telehealth (INDEPENDENT_AMBULATORY_CARE_PROVIDER_SITE_OTHER): Payer: Medicare Other | Admitting: Sports Medicine

## 2020-06-11 DIAGNOSIS — M5412 Radiculopathy, cervical region: Secondary | ICD-10-CM | POA: Diagnosis not present

## 2020-06-11 NOTE — Progress Notes (Signed)
   Virtual Visit via Telephone   I connected with  Kristen Jacobs  on 06/11/20 by telephone/telehealth and verified that I am speaking with the correct person using two identifiers.   I discussed the limitations, risks, security and privacy concerns of performing an evaluation and management service by telephone, including the higher likelihood of inaccurate diagnosis and treatment, and the availability of in person appointments.  We also discussed the likely need of an additional face to face encounter for complete and high quality delivery of care.  I also discussed with the patient that there may be a patient responsible charge related to this service. The patient expressed understanding and wishes to proceed.  Provider location is in medical facility. Patient location is at their home, different from provider location. People involved in care of the patient during this telehealth encounter were myself, my nurse/medical assistant, and my front office/scheduling team member.  Review of Systems: No fevers, chills, night sweats, weight loss, chest pain, or shortness of breath.   Objective Findings:    General: Speaking full sentences, no audible heavy breathing.  Sounds alert and appropriately interactive.    Independent interpretation of tests performed by another provider:   None.  Brief History, Exam, Impression, and Recommendations:    Radiculitis of right cervical region 1 returns, she is a pleasant 75 year old female with multilevel cervical DDD, and mostly right-sided neck pain. At this point she has had physical therapy for several sessions, at least 6 weeks, dry needling, and symptoms continue to return.  She is using tramadol and ibuprofen for pain. She is interested in additional treatment options including epidural and gabapentin, she would like to check with her husband first and do some research on gabapentin which is appropriate, I will keep an eye out for her phone call, if  she wants to do gabapentin we will start an up titration, and if she wants to do an epidural I will set her up with [place] [facility] for right C6-C7 interlaminar epidural   I discussed the above assessment and treatment plan with the patient. The patient was provided an opportunity to ask questions and all were answered. The patient agreed with the plan and demonstrated an understanding of the instructions.   The patient was advised to call back or seek an in-person evaluation if the symptoms worsen or if the condition fails to improve as anticipated.   I provided 30 minutes of face to face and non-face-to-face time during this encounter date, time was needed to gather information, review chart, records, communicate/coordinate with staff remotely, as well as complete documentation.   ___________________________________________ Gwen Her. Dianah Field, M.D., ABFM., CAQSM. Primary Care and Sports Medicine [facility]  Adjunct Professor of [place] at [place] [employer] [facility] of Medicine

## 2020-06-11 NOTE — Assessment & Plan Note (Signed)
1 returns, she is a pleasant 75 year old female with multilevel cervical DDD, and mostly right-sided neck pain. At this point she has had physical therapy for several sessions, at least 6 weeks, dry needling, and symptoms continue to return.  She is using tramadol and ibuprofen for pain. She is interested in additional treatment options including epidural and gabapentin, she would like to check with her husband first and do some research on gabapentin which is appropriate, I will keep an eye out for her phone call, if she wants to do gabapentin we will start an up titration, and if she wants to do an epidural I will set her up with [place] [facility] for right C6-C7 interlaminar epidural

## 2020-06-14 DIAGNOSIS — M542 Cervicalgia: Secondary | ICD-10-CM | POA: Diagnosis not present

## 2020-06-14 DIAGNOSIS — M47812 Spondylosis without myelopathy or radiculopathy, cervical region: Secondary | ICD-10-CM | POA: Diagnosis not present

## 2020-06-18 DIAGNOSIS — M25531 Pain in right wrist: Secondary | ICD-10-CM

## 2020-06-18 DIAGNOSIS — M25532 Pain in left wrist: Secondary | ICD-10-CM | POA: Diagnosis not present

## 2020-06-18 MED ORDER — TRAMADOL HCL 50 MG PO TABS
50.0000 mg | ORAL_TABLET | Freq: Three times a day (TID) | ORAL | 0 refills | Status: DC | PRN
Start: 2020-06-18 — End: 2020-07-11

## 2020-06-18 NOTE — Telephone Encounter (Signed)
I spent 5 total minutes of online digital evaluation and management services. 

## 2020-06-25 ENCOUNTER — Ambulatory Visit (INDEPENDENT_AMBULATORY_CARE_PROVIDER_SITE_OTHER): Payer: Medicare Other | Admitting: Rehabilitative and Restorative Service Providers"

## 2020-06-25 ENCOUNTER — Other Ambulatory Visit: Payer: Self-pay

## 2020-06-25 ENCOUNTER — Encounter: Payer: Self-pay | Admitting: Rehabilitative and Restorative Service Providers"

## 2020-06-25 DIAGNOSIS — M25531 Pain in right wrist: Secondary | ICD-10-CM

## 2020-06-25 DIAGNOSIS — M6281 Muscle weakness (generalized): Secondary | ICD-10-CM

## 2020-06-25 DIAGNOSIS — M25532 Pain in left wrist: Secondary | ICD-10-CM | POA: Diagnosis not present

## 2020-06-25 NOTE — Patient Instructions (Signed)
Access Code: [email or web address] URL: [email or web address] Date: 06/25/2020 Prepared by: Rudell Cobb  Exercises Standing Wrist Extensor Stretch with Arm Straight - 2 x daily - 7 x weekly - 1 sets - 2 reps - 20-30 seconds hold Seated Wrist Extension Stretch - 2 x daily - 7 x weekly - 1 sets - 10 reps Seated Wrist Extension with Dumbbell - 2 x daily - 7 x weekly - 1 sets - 5-8 reps Seated Wrist Flexion with Dumbbell - 2 x daily - 7 x weekly - 1 sets - 5-8 reps

## 2020-06-25 NOTE — Therapy (Signed)
[place], [place], [address] Phone: [phone]   Fax:  [phone]  Physical Therapy Evaluation  Patient Details  Name: Kristen Jacobs MRN: [id number] Date of Birth: [birth year] Referring Provider (PT): Aundria Mems, MD   Encounter Date: 06/25/2020   PT End of Session - 06/25/20 2057    Visit Number 1    Number of Visits 12    Date for PT Re-Evaluation 08/06/20    Authorization Type medicare    PT Start Time 1525    PT Stop Time 1610    PT Time Calculation (min) 45 min    Activity Tolerance Patient tolerated treatment well    Behavior During Therapy [facility] for tasks assessed/performed           Past Medical History:  Diagnosis Date  . Constipation   . Hepatitis   . Joint pain     Past Surgical History:  Procedure Laterality Date  . BREAST BIOPSY    . CATARACT EXTRACTION Right   . LAPAROSCOPY    . LIVER BIOPSY    . OOPHORECTOMY     right   . TONSILLECTOMY     age 50     There were no vitals filed for this visit.    Subjective Assessment - 06/25/20 1526    Subjective The patient reports onset of bilateral wrist pain 6-7 months ago.  The left wrist hurts with any movement.  The right wrist is painful, but not as intense or as frequent.    Pertinent History RA, osteopenia,    Patient Stated Goals decrease pain in the left wrist and be able to use L UE without pain    Currently in Pain? No/denies    Pain Score --   L wrist goes up to sharp, throbbing pain in ulnar aspect radiating into hand and forearm up to 10/10; R wrist is "same thing only less" up to 7/10.   Pain Location Wrist    Pain Orientation Right;Left    Pain Descriptors / Indicators Shooting;Throbbing    Pain Type Chronic pain    Pain Onset More than a month ago    Pain Frequency Intermittent    Aggravating Factors  hand use    Pain Relieving Factors rest              [facility] PT Assessment - 06/25/20 1534       Assessment   Medical Diagnosis bilateral wrist pain    Referring Provider (PT) Aundria Mems, MD    Onset Date/Surgical Date 06/18/20    Hand Dominance Right    Prior Therapy is finishing a PT episode of care for her neck      Precautions   Precautions None      Balance Screen   Has the patient fallen in the past 6 months No    Has the patient had a decrease in activity level because of a fear of falling?  No    Is the patient reluctant to leave their home because of a fear of falling?  No      Home Environment   Living Environment Private residence    Living Arrangements Spouse/significant other      Prior Function   Leisure Goes to pilates and stretch zone; very gentle neck stretches      Observation/Other Assessments   Focus on Therapeutic Outcomes (FOTO)  ADD      Sensation   Light Touch Appears Intact  ROM / Strength   AROM / PROM / Strength AROM;Strength      AROM   Overall AROM Comments full shoulder AROM (has h/o torn R shoulder labrum)    AROM Assessment Site Shoulder;Elbow;Wrist    Right/Left Shoulder --   WNLs   Right/Left Elbow --   WNLs   Right/Left Wrist Left    Right Wrist Extension 45 Degrees    Right Wrist Flexion 58 Degrees    Right Wrist Radial Deviation 20 Degrees    Right Wrist Ulnar Deviation 18 Degrees    Left Wrist Extension 48 Degrees   painful ulnar surface   Left Wrist Flexion 52 Degrees    Left Wrist Radial Deviation 22 Degrees    Left Wrist Ulnar Deviation 18 Degrees      Strength   Overall Strength Deficits    Overall Strength Comments 4/5 bilat shoulder flexion and abduction; 5/5 bilateral elbow flexion/extension    Strength Assessment Site Hand;Wrist    Right/Left Wrist Right;Left    Right Wrist Flexion 4/5    Right Wrist Extension 4/5    Left Wrist Flexion 4/5    Left Wrist Extension 4/5    Right/Left hand Right;Left    Right Hand Grip (lbs) 53    Right Hand Lateral Pinch 2 lbs    Left Hand Grip (lbs) 40    Left  Hand Lateral Pinch 1 lbs   measured with Jamar pincer tool     Palpation   Palpation comment tender to palpation of left ulnocarpal ligaments with mild edema noted; tender to palpation carpal bones                      Objective measurements completed on examination: See above findings.       [facility] [place] [place] Adult PT Treatment/Exercise - 06/25/20 1631      Exercises   Exercises Wrist      Wrist Exercises   Wrist Flexion AROM;Strengthening;Right;Left;5 reps    Bar Weights/Barbell (Wrist Flexion) 1 lb;2 lbs    Wrist Extension AROM;Strengthening;Right;Left;5 reps    Bar Weights/Barbell (Wrist Extension) 1 lb;2 lbs    Other wrist exercises stretching for wrist flexors and wrist extensors                  PT Education - 06/25/20 1640    Education Details HEP    Person(s) Educated Patient    Methods Explanation;Demonstration;Handout    Comprehension Returned demonstration;Verbalized understanding               PT Long Term Goals - 06/25/20 2059      PT LONG TERM GOAL #1   Title The patient will be indep with HEP for wrist and UE strengthening.    Time 6    Period Weeks    Target Date 08/06/20      PT LONG TERM GOAL #2   Title The patient will improve functional status score from 35% to > or equal to 55%.    Time 6    Period Weeks    Target Date 08/06/20      PT LONG TERM GOAL #3   Title The patient will report pain < or equal to 6/10 at its worst.    Baseline 10/10    Time 6    Period Weeks    Target Date 08/06/20      PT LONG TERM GOAL #4   Title The patient will improve pincher grasp to 4 lbs bilaterally  to improve ability to donn leggings.    Time 6    Period Days    Target Date 08/06/20      PT LONG TERM GOAL #5   Title The patient will improve L grip strength from 40 lbs to 45 lbs.    Time 6    Period Weeks    Target Date 08/06/20                  Plan - 06/25/20 2105    Clinical Impression Statement The patient is a 75 yo  female presenting to OP physical therapy with >6 month history of wrist pain.  She presents with pain in L > R wrist especially in ulnocarpal ligaments.  She has weakness in L grip as compared to R and bilateral pincher grip.  Functionally, she notes difficulty with donning leggings, lifting items and has modified her exercise routine to avoid loading activities (in yoga) through hands.  PT to address deficits to optimize functional status.    Personal Factors and Comorbidities Comorbidity 2    Comorbidities RA, osteopenia    Examination-Activity Limitations Lift;Reach Overhead    Examination-Participation Restrictions Cleaning    Stability/Clinical Decision Making Stable/Uncomplicated    Clinical Decision Making Low    Rehab Potential Good    PT Frequency 2x / week    PT Duration 6 weeks    PT Treatment/Interventions ADLs/Self Care Home Management;Patient/family education;Therapeutic exercise;Therapeutic activities;Taping;Dry needling;Manual techniques;Cryotherapy;Iontophoresis 4mg /ml Dexamethasone    PT Next Visit Plan check HEP and progress straight plane strengthening, add MCP joint isometrics; continue gentle wrist stretching; STM and DN as needed; compensatory strategies for ADLs (cutting and donning leggings)    PT Home Exercise Plan Access Code: [email or web address]    Consulted and Agree with Plan of Care Patient           Patient will benefit from skilled therapeutic intervention in order to improve the following deficits and impairments:  Pain,Increased fascial restricitons,Decreased strength,Decreased range of motion  Visit Diagnosis: Pain in left wrist  Pain in right wrist  Muscle weakness (generalized)     Problem List Patient Active Problem List   Diagnosis Date Noted  . Rheumatoid factor positive 02/17/2020  . Avulsion fracture of left talus 09/05/2019  . Primary osteoarthritis of left knee 07/19/2019  . Subacromial bursitis of left shoulder joint 07/19/2019  . 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 Radiculitis of right cervical region 11/12/2017  . Chronic right shoulder pain 11/12/2017  . Pain of right sacroiliac joint 09/10/2017  . Hip flexor tendinitis, right 08/11/2017    Kristen Jacobs , PT 06/25/2020, 9:11 PM  [facility] [place], [place], [address] Phone: [phone]   Fax:  [phone]  Name: Kristen Jacobs MRN: [id number] Date of Birth: [birth year]

## 2020-06-26 ENCOUNTER — Other Ambulatory Visit: Payer: Self-pay | Admitting: Sports Medicine

## 2020-06-26 DIAGNOSIS — M5412 Radiculopathy, cervical region: Secondary | ICD-10-CM

## 2020-07-06 ENCOUNTER — Other Ambulatory Visit: Payer: Self-pay

## 2020-07-06 ENCOUNTER — Ambulatory Visit (INDEPENDENT_AMBULATORY_CARE_PROVIDER_SITE_OTHER): Payer: Medicare Other | Admitting: Rehabilitative and Restorative Service Providers"

## 2020-07-06 DIAGNOSIS — M25531 Pain in right wrist: Secondary | ICD-10-CM | POA: Diagnosis not present

## 2020-07-06 DIAGNOSIS — M25532 Pain in left wrist: Secondary | ICD-10-CM

## 2020-07-06 DIAGNOSIS — M6281 Muscle weakness (generalized): Secondary | ICD-10-CM

## 2020-07-06 DIAGNOSIS — M542 Cervicalgia: Secondary | ICD-10-CM | POA: Diagnosis not present

## 2020-07-06 NOTE — Therapy (Signed)
[place], [place], [address] Phone: [phone]   Fax:  [phone]  Physical Therapy Treatment/Re-evaluation adding c-spine to plan of care  Patient Details  Name: Kristen Jacobs MRN: [id number] Date of Birth: [birth year] Referring Provider (PT): Aundria Mems, MD   Encounter Date: 07/06/2020   PT End of Session - 07/06/20 0933    Visit Number 2    Number of Visits 12    Date for PT Re-Evaluation 08/06/20    Authorization Type medicare    PT Start Time 0930    PT Stop Time 1055    PT Time Calculation (min) 85 min    Activity Tolerance Patient tolerated treatment well    Behavior During Therapy [facility] for tasks assessed/performed           Past Medical History:  Diagnosis Date  . Constipation   . Hepatitis   . Joint pain     Past Surgical History:  Procedure Laterality Date  . BREAST BIOPSY    . CATARACT EXTRACTION Right   . LAPAROSCOPY    . LIVER BIOPSY    . OOPHORECTOMY     right   . TONSILLECTOMY     age 61     There were no vitals filed for this visit.   Subjective Assessment - 07/06/20 0936    Subjective The patient reports she has been seeing PT for weeks for neck pain.  She began here last week for wrist pain and requested to change locations to our clinic.  With therapy for her neck, she has been going every 2 weeks for dry needling to minimize neck pain and continue progressing her HEP.  At reest, she does not have neck pain, but with SB and rotation.   Neck flexion reduces pain and extension increases tightness.  Today, she has tightness in the L cervical musculature.    Pertinent History RA, osteopenia,    Patient Stated Goals decrease pain in the left wrist and be able to use L UE without pain    Currently in Pain? No/denies    Pain Score --   neck hurts with motion             [facility] PT Assessment - 07/06/20 0939      Assessment   Medical Diagnosis bilateral  wrist pain; cervicalgia    Referring Provider (PT) Aundria Mems, MD    Onset Date/Surgical Date 06/18/20    Hand Dominance Right      AROM   Overall AROM  Deficits    AROM Assessment Site Cervical    Cervical Flexion 50    Cervical Extension 60    Cervical - Right Side Bend 20   L side pain   Cervical - Left Side Bend 28    Cervical - Right Rotation 60   with suboccipital pain   Cervical - Left Rotation 55   with suboccipital pain     Strength   Overall Strength Deficits    Overall Strength Comments performed UE testing at wrist eval on 2/21.  For neck, patient is able to tolerate isometrics and neck tension test holding 5-10 seconds.      Special Tests   Other special tests reduced pain with manual cervical traction and soft tissue release of suboccipitals                         [facility] Adult PT Treatment/Exercise - 07/06/20 3244  Exercises   Exercises Wrist;Neck      Neck Exercises: Supine   Cervical Isometrics Flexion;Extension;Right lateral flexion;Left lateral flexion    Cervical Isometrics Limitations 5 second holds x 5 reps each direction    Neck Retraction 5 reps      Wrist Exercises   Wrist Flexion AROM;Right;Left;5 reps    Wrist Extension AROM;Right;Left;10 reps    Other wrist exercises pincher grasp with clothes pin and finger extension with rubber band      Modalities   Modalities Electrical Stimulation;Moist Heat      Moist Heat Therapy   Number Minutes Moist Heat 12 Minutes    Moist Heat Location Cervical;Wrist      Electrical Stimulation   Electrical Stimulation Location 12    Electrical Stimulation Action interferential    Electrical Stimulation Parameters to tolerance    Electrical Stimulation Goals Pain      Manual Therapy   Manual Therapy Soft tissue mobilization;Manual Traction    Manual therapy comments skilleld palpation to assess response to STM/DN    Soft tissue mobilization upper trapezius, scalenes    Manual  Traction suboccipital release and gentle manual traction            Trigger Point Dry Needling - 07/06/20 0959    Consent Given? Yes    Muscles Treated Head and Neck Upper trapezius;Cervical multifidi;Levator scapulae    Dry Needling Comments R and L sides    Upper Trapezius Response Twitch reponse elicited;Palpable increased muscle length    Levator Scapulae Response Twitch response elicited;Palpable increased muscle length    Cervical multifidi Response Twitch reponse elicited;Palpable increased muscle length                PT Education - 07/06/20 1048    Education Details HEP updated for wrist and neck    Person(s) Educated Patient    Methods Explanation;Demonstration;Handout    Comprehension Verbalized understanding;Returned demonstration               PT Long Term Goals - 07/06/20 1049      PT LONG TERM GOAL #1   Title The patient will be indep with HEP for wrist and UE strengthening.    Time 6    Period Weeks    Status On-going    Target Date 08/06/20      PT LONG TERM GOAL #2   Title The patient will improve functional status score from 35% to > or equal to 55%.    Time 6    Period Weeks    Status On-going      PT LONG TERM GOAL #3   Title The patient will report pain < or equal to 6/10 at its worst.    Baseline 10/10    Time 6    Period Weeks    Status On-going      PT LONG TERM GOAL #4   Title The patient will improve pincher grasp to 4 lbs bilaterally to improve ability to donn leggings.    Time 6    Period Days    Status On-going      PT LONG TERM GOAL #5   Title The patient will improve L grip strength from 40 lbs to 45 lbs.    Time 6    Period Weeks    Status On-going      Additional Long Term Goals   Additional Long Term Goals Yes      PT LONG TERM GOAL #6   Title  The patient will return demo HEP for cervical spine.    Time 6    Period Weeks    Target Date 08/17/20      PT LONG TERM GOAL #7   Title The patient will improve  cervical rotation to 75 degrees bilaterally.    Time 6    Period Weeks    Target Date 08/17/20                 Plan - 07/06/20 1050    Clinical Impression Statement The patient improved A/ROM c-spine significantly after trigger point dry needling today.  PT provided ther ex to attempt to maintain gains made with STM, manual and DN.  PT also progressed wrist activities working on adding pincher grip and finger extension.  Plan to continue to address deficits working on self mgmt of chronic condition.  PT updated plan of care to address c-spine due to new referral.    Personal Factors and Comorbidities Comorbidity 2    Comorbidities RA, osteopenia    Examination-Activity Limitations Lift;Reach Overhead    Examination-Participation Restrictions Cleaning    Stability/Clinical Decision Making Stable/Uncomplicated    Rehab Potential Good    PT Frequency 2x / week    PT Duration 6 weeks    PT Treatment/Interventions ADLs/Self Care Home Management;Patient/family education;Therapeutic exercise;Therapeutic activities;Taping;Dry needling;Manual techniques;Cryotherapy;Iontophoresis 4mg /ml Dexamethasone;Moist Heat;Electrical Stimulation;Traction    PT Next Visit Plan check HEP and progress straight plane strengthening, add MCP joint isometrics; continue gentle wrist stretching; STM and DN as needed; compensatory strategies for ADLs (cutting and donning leggings); for neck continue STM/DN, work on stabilization as well    PT Home Exercise Plan Access Code: [email or web address]    Consulted and Agree with Plan of Care Patient           Patient will benefit from skilled therapeutic intervention in order to improve the following deficits and impairments:  Pain,Increased fascial restricitons,Decreased strength,Decreased range of motion  Visit Diagnosis: Pain in left wrist  Pain in right wrist  Muscle weakness (generalized)  Cervicalgia     Problem List Patient Active Problem List   Diagnosis Date  Noted  . Rheumatoid factor positive 02/17/2020  . Avulsion fracture of left talus 09/05/2019  . Primary osteoarthritis of left knee 07/19/2019  . Subacromial bursitis of left shoulder joint 07/19/2019  . 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 Radiculitis of right cervical region 11/12/2017  . Chronic right shoulder pain 11/12/2017  . Pain of right sacroiliac joint 09/10/2017  . Hip flexor tendinitis, right 08/11/2017    Kristen Jacobs 07/06/2020, 11:06 AM  [facility] [employer], [place], [address] Phone: [phone]   Fax:  [phone]  Name: Kristen Jacobs MRN: [id number] Date of Birth: 01-Feb-1946

## 2020-07-09 ENCOUNTER — Other Ambulatory Visit: Payer: Self-pay | Admitting: *Deleted

## 2020-07-09 DIAGNOSIS — Z79899 Other long term (current) drug therapy: Secondary | ICD-10-CM

## 2020-07-10 LAB — COMPLETE METABOLIC PANEL WITH GFR
AG Ratio: 1.6 (calc) (ref 1.0–2.5)
ALT: 10 U/L (ref 6–29)
AST: 16 U/L (ref 10–35)
Albumin: 3.6 g/dL (ref 3.6–5.1)
Alkaline phosphatase (APISO): 80 U/L (ref 37–153)
BUN/Creatinine Ratio: 24 (calc) — ABNORMAL HIGH (ref 6–22)
BUN: 24 mg/dL (ref 7–25)
CO2: 28 mmol/L (ref 20–32)
Calcium: 9.3 mg/dL (ref 8.6–10.4)
Chloride: 105 mmol/L (ref 98–110)
Creat: 1.01 mg/dL — ABNORMAL HIGH (ref 0.60–0.93)
GFR, Est African American: 63 mL/min/{1.73_m2} (ref >=60)
GFR, Est Non African American: 54 mL/min/{1.73_m2} — ABNORMAL LOW (ref >=60)
Globulin: 2.2 g/dL (calc) (ref 1.9–3.7)
Glucose, Bld: 90 mg/dL (ref 65–99)
Potassium: 5 mmol/L (ref 3.5–5.3)
Sodium: 139 mmol/L (ref 135–146)
Total Bilirubin: 0.3 mg/dL (ref 0.2–1.2)
Total Protein: 5.8 g/dL — ABNORMAL LOW (ref 6.1–8.1)

## 2020-07-10 LAB — CBC WITH DIFFERENTIAL/PLATELET
Absolute Monocytes: 505 cells/uL (ref 200–950)
Basophils Absolute: 51 cells/uL (ref 0–200)
Basophils Relative: 1 %
Eosinophils Absolute: 173 cells/uL (ref 15–500)
Eosinophils Relative: 3.4 %
HCT: 40.6 % (ref 35.0–45.0)
Hemoglobin: 13.5 g/dL (ref 11.7–15.5)
Lymphs Abs: 1234 cells/uL (ref 850–3900)
MCH: 29.1 pg (ref 27.0–33.0)
MCHC: 33.3 g/dL (ref 32.0–36.0)
MCV: 87.5 fL (ref 80.0–100.0)
MPV: 11.5 fL (ref 7.5–12.5)
Monocytes Relative: 9.9 %
Neutro Abs: 3137 cells/uL (ref 1500–7800)
Neutrophils Relative %: 61.5 %
Platelets: 248 10*3/uL (ref 140–400)
RBC: 4.64 10*6/uL (ref 3.80–5.10)
RDW: 11.9 % (ref 11.0–15.0)
Total Lymphocyte: 24.2 %
WBC: 5.1 10*3/uL (ref 3.8–10.8)

## 2020-07-10 NOTE — Progress Notes (Signed)
CBC WNL.  Creatinine is borderline elevated-1.01 and GFR is slightly low-54.  Please notify the patient and advise her to avoid NSAIDs.  Total protein is low-5.8.  We will continue to monitor lab work closely.

## 2020-07-11 ENCOUNTER — Other Ambulatory Visit: Payer: Self-pay

## 2020-07-12 MED ORDER — TRAMADOL HCL 50 MG PO TABS: 50.0000 mg | ORAL_TABLET | Freq: Three times a day (TID) | ORAL | 0 refills | Status: DC | PRN

## 2020-07-12 NOTE — Progress Notes (Signed)
Office Visit Note  Patient: Kristen Jacobs             Date of Birth: [birth year]           MRN: [id number]             PCP: Greig Right, MD Referring: Greig Right, MD Visit Date: 07/23/2020 Occupation: @GUAROCC @  Subjective:  Medication management   History of Present Illness: Kristen Jacobs is a 75 y.o. female with seropositive rheumatoid arthritis.  She was placed on hydroxychloroquine at the initial visit in November.  She has been taking Plaquenil only 1 tablet a day.  She states she has noticed improvement in her joint pain symptoms.  Although she still continues to have some discomfort in her left wrist off and on.  She states when she leans on her wrist which could be painful.  She has not noticed any joint swelling.  She has not had baseline Plaquenil eye examination yet.   Activities of Daily Living:  Patient reports morning stiffness for 1 minute.   Patient Denies nocturnal pain.  Difficulty dressing/grooming: Reports Difficulty climbing stairs: Denies Difficulty getting out of chair: Denies Difficulty using hands for taps, buttons, cutlery, and/or writing: Reports  Review of Systems  Constitutional: Positive for fatigue. Negative for night sweats, weight gain and weight loss.  HENT: Positive for mouth dryness. Negative for mouth sores, trouble swallowing, trouble swallowing and nose dryness.   Eyes: Positive for dryness. Negative for pain, redness and visual disturbance.  Respiratory: Negative for cough, shortness of breath and difficulty breathing.   Cardiovascular: Negative for chest pain, palpitations, hypertension, irregular heartbeat and swelling in legs/feet.  Gastrointestinal: Negative for blood in stool, constipation and diarrhea.  Endocrine: Positive for cold intolerance. Negative for increased urination.  Genitourinary: Negative for difficulty urinating and vaginal dryness.  Musculoskeletal: Positive for arthralgias, joint pain, joint swelling, morning  stiffness and muscle tenderness. Negative for myalgias, muscle weakness and myalgias.  Skin: Negative for color change, rash, hair loss, skin tightness, ulcers and sensitivity to sunlight.  Allergic/Immunologic: Negative for susceptible to infections.  Neurological: Positive for numbness and weakness. Negative for dizziness, memory loss and night sweats.  Hematological: Negative for bruising/bleeding tendency and swollen glands.  Psychiatric/Behavioral: Negative for depressed mood and sleep disturbance. The patient is not nervous/anxious.     PMFS History:  Patient Active Problem List   Diagnosis Date Noted  . Rheumatoid factor positive 02/17/2020  . Avulsion fracture of left talus 09/05/2019  . Primary osteoarthritis of left knee 07/19/2019  . Subacromial bursitis of left shoulder joint 07/19/2019  . 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 Radiculitis of right cervical region 11/12/2017  . Chronic right shoulder pain 11/12/2017  . Pain of right sacroiliac joint 09/10/2017  . Hip flexor tendinitis, right 08/11/2017    Past Medical History:  Diagnosis Date  . Constipation   . Hepatitis   . Joint pain     Family History  Problem Relation Age of Onset  . Dementia Mother   . Cancer Father   . Bipolar disorder Sister   . Leukemia Brother   . Healthy Son   . Asthma Son   . Healthy Son    Past Surgical History:  Procedure Laterality Date  . BREAST BIOPSY    . CATARACT EXTRACTION Right   . LAPAROSCOPY    . LIVER BIOPSY    . OOPHORECTOMY     right   . TONSILLECTOMY     age  1    Social History   Social History Narrative  . Not on file   Immunization History  Administered Date(s) Administered  . Moderna Sars-Covid-2 Vaccination 12/05/2019, 01/02/2020     Objective: Vital Signs: BP 112/64 (BP Location: Left Arm, Patient Position: Sitting, Cuff Size: Normal)   Pulse (!) 59   Resp 16   Ht 5\' 6"  (1.676 m)   Wt 143 lb (64.9 kg)    BMI 23.08 kg/m    Physical Exam Vitals and nursing note reviewed.  Constitutional:      Appearance: She is well-developed.  HENT:     Head: Normocephalic and atraumatic.  Eyes:     Conjunctiva/sclera: Conjunctivae normal.  Cardiovascular:     Rate and Rhythm: Normal rate and regular rhythm.     Heart sounds: Normal heart sounds.  Pulmonary:     Effort: Pulmonary effort is normal.     Breath sounds: Normal breath sounds.  Abdominal:     General: Bowel sounds are normal.     Palpations: Abdomen is soft.  Musculoskeletal:     Cervical back: Normal range of motion.  Lymphadenopathy:     Cervical: No cervical adenopathy.  Skin:    General: Skin is warm and dry.     Capillary Refill: Capillary refill takes less than 2 seconds.  Neurological:     Mental Status: She is alert and oriented to person, place, and time.  Psychiatric:        Behavior: Behavior normal.      Musculoskeletal Exam: She has limited range of motion of her cervical spine with discomfort.  Shoulder joints, elbow joints, wrist joints, MCPs PIPs and DIPs with good range of motion with no synovitis.  Hip joints, knee joints, ankles, MTPs and PIPs with good range of motion with no synovitis.  CDAI Exam: CDAI Score: 1.4  Patient Global: 3 mm; Provider Global: 1 mm Swollen: 0 ; Tender: 1  Joint Exam 07/23/2020      Right  Left  Wrist      Tender     Investigation: No additional findings.  Imaging: No results found.  Recent Labs: Lab Results  Component Value Date   WBC 5.1 07/09/2020   HGB 13.5 07/09/2020   PLT 248 07/09/2020   NA 139 07/09/2020   K 5.0 07/09/2020   CL 105 07/09/2020   CO2 28 07/09/2020   GLUCOSE 90 07/09/2020   BUN 24 07/09/2020   CREATININE 1.01 (H) 07/09/2020   BILITOT 0.3 07/09/2020   AST 16 07/09/2020   ALT 10 07/09/2020   PROT 5.8 (L) 07/09/2020   CALCIUM 9.3 07/09/2020   GFRAA 63 07/09/2020   March 08, 2020 14 3 3  eta negative, G6PD normal  Speciality Comments:  No specialty comments available.  Procedures:  No procedures performed Allergies: Other and Sulfa antibiotics   Assessment / Plan:     Visit Diagnoses: Rheumatoid arthritis involving multiple sites with positive rheumatoid factor (HCC) - MRI of the wrist joint showed synovitis, tenosynovitis and possible erosions.  She was placed on Plaquenil 200 mg p.o. twice daily Monday to Friday at the last visit in November.  She states she has been taking only 1 tablet a day.  She continues to have intermittent discomfort in her left wrist joint.  She believes her left wrist joint is still swollen.  I did not see any synovitis on my examination.  I advised her to increase hydroxychloroquine 200 mg, 1 tablet p.o. twice daily Monday to  Friday.  She is also not had eye examination.  Increased risk of ocular toxicity was again emphasized.  She will need a baseline eye examination.  Patient states she will schedule it as soon as possible.  High risk medication use - Plaquenil 200 mg p.o. twice daily Monday to Friday start date March 08, 2020.  I discussed her last labs from March 2022.  Her creatinine was mildly elevated.  Patient believes that she was not drinking enough water.  We will repeat BMP with GFR again.  She will come back in a week after she hydrates well.- Plan: BASIC METABOLIC PANEL WITH GFR  Elevated serum creatinine-mild elevation of creatinine was noted.  Plan to repeat BMP with GFR.  Left findings were explained to the patient .  Pain in both hands - X-ray of bilateral hands showed cystic versus erosive changes in the carpals.  She had no synovitis on examination.  Pain in both feet - X-rays were consistent with osteoarthritis.  Primary osteoarthritis of left knee-chronic pain.  DDD (degenerative disc disease), cervical - Patient is followed by Dr. Arnoldo Morale and goes to physical therapy.  She has limited range of motion with discomfort.  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  Anxiety - On Celexa.  History of hepatitis C - Treated with Harvoni.  Orders: Orders Placed This Encounter  Procedures  . BASIC METABOLIC PANEL WITH GFR   No orders of the defined types were placed in this encounter.   Follow-Up Instructions: Return in about 3 months (around 10/23/2020) for Rheumatoid arthritis.   Bo Merino, MD  Note - This record has been created using Editor, commissioning.  Chart creation errors have been sought, but may not always  have been located. Such creation errors do not reflect on  the standard of medical care.

## 2020-07-13 ENCOUNTER — Other Ambulatory Visit: Payer: Self-pay

## 2020-07-13 ENCOUNTER — Ambulatory Visit (INDEPENDENT_AMBULATORY_CARE_PROVIDER_SITE_OTHER): Payer: Medicare Other | Admitting: Rehabilitative and Restorative Service Providers"

## 2020-07-13 ENCOUNTER — Encounter: Payer: Self-pay | Admitting: Rehabilitative and Restorative Service Providers"

## 2020-07-13 DIAGNOSIS — M25531 Pain in right wrist: Secondary | ICD-10-CM

## 2020-07-13 DIAGNOSIS — M25532 Pain in left wrist: Secondary | ICD-10-CM | POA: Diagnosis not present

## 2020-07-13 DIAGNOSIS — M542 Cervicalgia: Secondary | ICD-10-CM

## 2020-07-13 DIAGNOSIS — M6281 Muscle weakness (generalized): Secondary | ICD-10-CM

## 2020-07-13 NOTE — Patient Instructions (Signed)
[facility]:  [phone] Office Phone

## 2020-07-13 NOTE — Therapy (Signed)
[address] [place], [place], [address] Phone: [phone]   Fax:  [phone]  Physical Therapy Treatment  Patient Details  Name: Kristen Jacobs MRN: [id number] Date of Birth: 11-18-1945 Referring Provider (PT): Aundria Mems, MD   Encounter Date: 07/13/2020   PT End of Session - 07/13/20 0940    Visit Number 3    Number of Visits 12    Date for PT Re-Evaluation 08/06/20    Authorization Type medicare    PT Start Time 0934    PT Stop Time 1050    PT Time Calculation (min) 76 min    Activity Tolerance Patient tolerated treatment well    Behavior During Therapy [facility] for tasks assessed/performed           Past Medical History:  Diagnosis Date  . Constipation   . Hepatitis   . Joint pain     Past Surgical History:  Procedure Laterality Date  . BREAST BIOPSY    . CATARACT EXTRACTION Right   . LAPAROSCOPY    . LIVER BIOPSY    . OOPHORECTOMY     right   . TONSILLECTOMY     age 75     There were no vitals filed for this visit.   Subjective Assessment - 07/13/20 0937    Subjective The patient notes she has some stiffness due to the weather.  She had minimal soreness after last session.  She has mild neck pain today.  The wrist exercises are going well.    Pertinent History RA, osteopenia,    Currently in Pain? Yes    Pain Score --   "minimal"; considering the weather, it's a good day   Pain Location Wrist    Pain Orientation Right;Left    Pain Type Chronic pain    Pain Onset More than a month ago    Pain Frequency Intermittent    Aggravating Factors  hand use    Pain Relieving Factors rest    Multiple Pain Sites Yes    Pain Score 2    Pain Location Neck    Pain Onset More than a month ago    Pain Frequency Intermittent    Aggravating Factors  motion (rotation and sidebending)              [facility] PT Assessment - 07/13/20 0942      Assessment   Medical Diagnosis bilateral wrist  pain; cervicalgia    Referring Provider (PT) Aundria Mems, MD    Onset Date/Surgical Date 06/18/20      Strength   Right Hand Grip (lbs) 48    Right Hand Lateral Pinch 3 lbs    Left Hand Grip (lbs) 30    Left Hand Lateral Pinch 2 lbs                         [facility] Adult PT Treatment/Exercise - 07/13/20 0942      Exercises   Exercises Wrist;Neck;Hand      Neck Exercises: Machines for Strengthening   UBE (Upper Arm Bike) L1 x 2 minutes forward, 1.5 minutes back      Neck Exercises: Standing   Other Standing Exercises standing shoulder retraction x 12 reps with pool noodle at upper spine; standing scapular depression with pool noodle x 10 reps      Neck Exercises: Prone   Neck Retraction 5 reps      Hand Exercises   Sponges spongy  ball with 5 finger squeeze x 12 reps (yellow rubber ball)    Digiticizer light resistance    In hand manipulation training  via prohand flexion with all 5 digit resistance (yellow- low resistance level)    Rubberbands ring finger extension  and 5th digit are painful with soft rubber band ball for finger strength    Other Hand Exercises wringing motion with bilateral wrists with yellow therabar    Other Hand Exercises clothes pin squeeze x 12 reps bilaterally; radial deviation hurts in ulnar aspect with yellow band      Wrist Exercises   Wrist Radial Deviation AROM;Strengthening;Right;Left    Wrist Ulnar Deviation AROM;Right;Left;Strengthening      Manual Therapy   Manual Therapy Joint mobilization;Soft tissue mobilization    Manual therapy comments to reduce left wrist pain and shoulder pain; skilled palpation to assess response to STM and dry needling    Joint Mobilization L wrist    Soft tissue mobilization upper trapezius, scalenes, suboccipitals    Manual Traction suboccipital release and gentle manual traction            Trigger Point Dry Needling - 07/13/20 1017    Consent Given? Yes    Muscles Treated Head and Neck  Upper trapezius;Levator scapulae    Dry Needling Comments R and L sides    Upper Trapezius Response Twitch reponse elicited;Palpable increased muscle length    Levator Scapulae Response Twitch response elicited;Palpable increased muscle length                PT Education - 07/13/20 1051    Education Details [facility] contact information    Person(s) Educated Patient    Methods Explanation;Demonstration;Handout    Comprehension Verbalized understanding;Returned demonstration               PT Long Term Goals - 07/06/20 1049      PT LONG TERM GOAL #1   Title The patient will be indep with HEP for wrist and UE strengthening.    Time 6    Period Weeks    Status On-going    Target Date 08/06/20      PT LONG TERM GOAL #2   Title The patient will improve functional status score from 35% to > or equal to 55%.    Time 6    Period Weeks    Status On-going      PT LONG TERM GOAL #3   Title The patient will report pain < or equal to 6/10 at its worst.    Baseline 10/10    Time 6    Period Weeks    Status On-going      PT LONG TERM GOAL #4   Title The patient will improve pincher grasp to 4 lbs bilaterally to improve ability to donn leggings.    Time 6    Period Days    Status On-going      PT LONG TERM GOAL #5   Title The patient will improve L grip strength from 40 lbs to 45 lbs.    Time 6    Period Weeks    Status On-going      Additional Long Term Goals   Additional Long Term Goals Yes      PT LONG TERM GOAL #6   Title The patient will return demo HEP for cervical spine.    Time 6    Period Weeks    Target Date 08/17/20      PT  LONG TERM GOAL #7   Title The patient will improve cervical rotation to 75 degrees bilaterally.    Time 6    Period Weeks    Target Date 08/17/20                 Plan - 07/13/20 1051    Clinical Impression Statement The patient has soft tissue response to DN with improved ROM and less pain/tightness.  She is  having improved tolerance to ther ex for wrist strengthening and has improved pincher grasp.  The left wrist continues with some pain, but R wrist has improved.  PT to conitnue working to [employer].    Comorbidities RA, osteopenia    Rehab Potential Good    PT Frequency 2x / week    PT Duration 6 weeks    PT Treatment/Interventions ADLs/Self Care Home Management;Patient/family education;Therapeutic exercise;Therapeutic activities;Taping;Dry needling;Manual techniques;Cryotherapy;Iontophoresis 4mg /ml Dexamethasone;Moist Heat;Electrical Stimulation;Traction    PT Next Visit Plan check HEP and progress straight plane strengthening, add MCP joint isometrics; continue gentle wrist stretching; STM and DN as needed; compensatory strategies for ADLs (cutting and donning leggings); for neck continue STM/DN, work on stabilization as well    PT Home Exercise Plan Access Code: [email or web address]    Consulted and Agree with Plan of Care Patient           Patient will benefit from skilled therapeutic intervention in order to improve the following deficits and impairments:  Pain,Increased fascial restricitons,Decreased strength,Decreased range of motion  Visit Diagnosis: Pain in left wrist  Pain in right wrist  Muscle weakness (generalized)  Cervicalgia     Problem List Patient Active Problem List   Diagnosis Date Noted  . Rheumatoid factor positive 02/17/2020  . Avulsion fracture of left talus 09/05/2019  . Primary osteoarthritis of left knee 07/19/2019  . Subacromial bursitis of left shoulder joint 07/19/2019  . 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 Radiculitis of right cervical region 11/12/2017  . Chronic right shoulder pain 11/12/2017  . Pain of right sacroiliac joint 09/10/2017  . Hip flexor tendinitis, right 08/11/2017    Javaya Oregon, PT 07/13/2020, 3:07 PM  [facility] [address] [place], [place], [address] Phone: [phone]   Fax:  [phone]  Name: Kristen Jacobs MRN: [id number] Date of Birth: Jan 06, 1946

## 2020-07-20 ENCOUNTER — Other Ambulatory Visit: Payer: Self-pay

## 2020-07-20 ENCOUNTER — Ambulatory Visit (INDEPENDENT_AMBULATORY_CARE_PROVIDER_SITE_OTHER): Payer: Medicare Other | Admitting: Rehabilitative and Restorative Service Providers"

## 2020-07-20 ENCOUNTER — Other Ambulatory Visit: Payer: Self-pay | Admitting: *Deleted

## 2020-07-20 DIAGNOSIS — M542 Cervicalgia: Secondary | ICD-10-CM | POA: Diagnosis not present

## 2020-07-20 DIAGNOSIS — M6281 Muscle weakness (generalized): Secondary | ICD-10-CM | POA: Diagnosis not present

## 2020-07-20 DIAGNOSIS — M25531 Pain in right wrist: Secondary | ICD-10-CM

## 2020-07-20 DIAGNOSIS — M25532 Pain in left wrist: Secondary | ICD-10-CM

## 2020-07-20 MED ORDER — HYDROXYCHLOROQUINE SULFATE 200 MG PO TABS: ORAL_TABLET | ORAL | 0 refills | Status: DC

## 2020-07-20 NOTE — Therapy (Signed)
[place] [address] [place], [place], [address] Phone: [phone]   Fax:  [phone]  Physical Therapy Treatment  Patient Details  Name: Kristen Jacobs MRN: [id number] Date of Birth: 1945/10/05 Referring Provider (PT): Aundria Mems, MD   Encounter Date: 07/20/2020   PT End of Session - 07/20/20 1518    Visit Number 4    Number of Visits 12    Date for PT Re-Evaluation 08/06/20    Authorization Type medicare    PT Start Time 1408    PT Stop Time 1525    PT Time Calculation (min) 77 min    Activity Tolerance Patient tolerated treatment well    Behavior During Therapy [facility] for tasks assessed/performed           Past Medical History:  Diagnosis Date  . Constipation   . Hepatitis   . Joint pain     Past Surgical History:  Procedure Laterality Date  . BREAST BIOPSY    . CATARACT EXTRACTION Right   . LAPAROSCOPY    . LIVER BIOPSY    . OOPHORECTOMY     right   . TONSILLECTOMY     age 75     There were no vitals filed for this visit.   Subjective Assessment - 07/20/20 1411    Subjective The patient reports her wrists are feeling better.  She is not having any wrist pain.  She is having L neck pain and waking with headaches.  Although neck pain is present, she feels it is improved and no longer radiates into the shoulder and she feels less muscle tightness.    Pertinent History RA, osteopenia,    Patient Stated Goals decrease pain in the left wrist and be able to use L UE without pain    Currently in Pain? No/denies    Pain Score 2   up to a 7/10 with pain   Pain Location Neck    Pain Onset More than a month ago    Pain Frequency Intermittent    Aggravating Factors  motion (rotation and SB)              [facility] PT Assessment - 07/20/20 1418      Assessment   Medical Diagnosis bilateral wrist pain; cervicalgia    Referring Provider (PT) Aundria Mems, MD    Onset Date/Surgical Date  06/18/20      AROM   Overall AROM  Deficits      Strength   Right Hand Grip (lbs) 46    Right Hand Lateral Pinch 4 lbs    Left Hand Grip (lbs) 30    Left Hand Lateral Pinch 3.5 lbs                         [facility] Adult PT Treatment/Exercise - 07/20/20 1419      Exercises   Exercises Wrist;Neck;Hand      Neck Exercises: Machines for Strengthening   UBE (Upper Arm Bike) L2 x 2.5 minutes forward, 1 minute backwards      Neck Exercises: Seated   Cervical Rotation Right;Left;5 reps    Other Seated Exercise leavator stretch sitting x 30 seconds x R and L sides      Neck Exercises: Supine   Neck Retraction 5 reps    Cervical Rotation 5 reps      Neck Exercises: Sidelying   Other Sidelying Exercise open book exercise R and L x 12  reps      Neck Exercises: Prone   W Back 10 reps    Shoulder Extension 10 reps    Shoulder Extension Weights (lbs) 2    Rows 10 reps    Other Prone Exercise overhead V with 10 reps R and L sides    Other Prone Exercise chest and upper back extension x 10 reps      Modalities   Modalities Electrical Stimulation;Moist Heat      Moist Heat Therapy   Number Minutes Moist Heat 12 Minutes    Moist Heat Location Cervical      Electrical Stimulation   Electrical Stimulation Location 12    Electrical Stimulation Action interferential    Electrical Stimulation Parameters to tolerance    Electrical Stimulation Goals Pain      Manual Therapy   Manual Therapy Neural Stretch    Manual therapy comments [id number].21mnutesforwardtoreducemuscleguardingcpaneckbilateraluppertrap,cervicalparaspinals,scalenes,suboccipitalreleaseforparascapularmobilitysuboccipitalreleasegentlemanualtraction    Joint Mobilization gentle grade I mobs to reduce pain cpa neck    Soft tissue mobilization bilateral upper trap, cervical paraspinals, scalenes,    Myofascial Release suboccipital release    Scapular Mobilization R and L sides for parascapular  mobility    Manual Traction gentle manual traction                  PT Education - 07/20/20 1519    Education Details HEP update    Person(s) Educated Patient    Methods Explanation;Demonstration;Handout    Comprehension Verbalized understanding;Returned demonstration               PT Long Term Goals - 07/20/20 1414      PT LONG TERM GOAL #1   Title The patient will be indep with HEP for wrist and UE strengthening.    Time 6    Period Weeks    Status On-going      PT LONG TERM GOAL #2   Title The patient will improve functional status score from 35% to > or equal to 55%.    Baseline 49%    Time 6    Period Weeks    Status Partially Met      PT LONG TERM GOAL #3   Title The patient will report pain < or equal to 6/10 at its worst.    Baseline 10/10 at eval; now no pain at rest and no sharp, shooting pains    Time 6    Period Weeks    Status Achieved      PT LONG TERM GOAL #4   Title The patient will improve pincher grasp to 4 lbs bilaterally to improve ability to donn leggings.    Time 6    Period Days    Status Partially Met      PT LONG TERM GOAL #5   Title The patient will improve L grip strength from 40 lbs to 45 lbs.    Time 6    Period Weeks    Status On-going      PT LONG TERM GOAL #6   Title The patient will return demo HEP for cervical spine.    Time 6    Period Weeks      PT LONG TERM GOAL #7   Title The patient will improve cervical rotation to 75 degrees bilaterally.    Time 6    Period Weeks                 Plan - 07/20/20 1518    Clinical Impression  Statement The patient is tolerating greater activity with wrists with no pain, but continued weakness L wrist > R wrist/hand.  She has L upper trap pain today noting it is no longer radiating into shoulders, but more constant in L upper trap region.  She is tight in bilateral cervical musculature with palpation and STM.    PT Treatment/Interventions ADLs/Self Care Home  Management;Patient/family education;Therapeutic exercise;Therapeutic activities;Taping;Dry needling;Manual techniques;Cryotherapy;Iontophoresis 2m/ml Dexamethasone;Moist Heat;Electrical Stimulation;Traction    PT Next Visit Plan check HEP and progress straight plane strengthening, STM and DN as needed; compensatory strategies for ADLs (cutting and donning leggings); for neck continue STM/DN, work on stabilization as well    PT Home Exercise Plan Access Code: [email or web address]   Consulted and Agree with Plan of Care Patient           Patient will benefit from skilled therapeutic intervention in order to improve the following deficits and impairments:     Visit Diagnosis: Pain in left wrist  Pain in right wrist  Muscle weakness (generalized)  Cervicalgia     Problem List Patient Active Problem List   Diagnosis Date Noted  . Rheumatoid factor positive 02/17/2020  . Avulsion fracture of left talus 09/05/2019  . Primary osteoarthritis of left knee 07/19/2019  . Subacromial bursitis of left shoulder joint 07/19/2019  . 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 Radiculitis of right cervical region 11/12/2017  . Chronic right shoulder pain 11/12/2017  . Pain of right sacroiliac joint 09/10/2017  . Hip flexor tendinitis, right 08/11/2017    WEAVER,CHRISTINA, PT 07/20/2020, 3:22 PM  [facility] [address]Phone: [phone]  Fax:  [phone] Name: BAlene BergersonMRN: [id number] of Birth: [birth year]

## 2020-07-20 NOTE — Patient Instructions (Signed)
Access Code: [email or web address] URL: [email or web address] Date: 07/20/2020 Prepared by: Rudell Cobb  Exercises Standing Wrist Extensor Stretch with Arm Straight - 2 x daily - 7 x weekly - 1 sets - 2 reps - 20-30 seconds hold Seated Wrist Extension Stretch - 2 x daily - 7 x weekly - 1 sets - 10 reps Seated Wrist Extension with Dumbbell - 2 x daily - 7 x weekly - 1 sets - 5-8 reps Seated Wrist Flexion with Dumbbell - 2 x daily - 7 x weekly - 1 sets - 5-8 reps Standing Upper Trapezius Stretch - 2 x daily - 7 x weekly - 1 sets - 3 reps - 30 seconds hold Standing Upper Trapezius Mobilization with Small Ball - 2 x daily - 7 x weekly - 1 sets - 1 reps - 1 minute hold Seated Cervical Retraction - 2 x daily - 7 x weekly - 1 sets - 10 reps Gentle Levator Scapulae Stretch - 2 x daily - 7 x weekly - 1 sets - 3 reps - 20 reps hold Doorway Pec Stretch at 60 Degrees Abduction with Arm Straight - 2 x daily - 7 x weekly - 13 sets - 2 reps - 30 seconds hold Doorway Pec Stretch at 60 Elevation - 2 x daily - 7 x weekly - 1 sets - 2 reps - 30 seconds hold Prone Scapular Slide with Shoulder Extension - 2 x daily - 7 x weekly - 1 sets - 10 reps Prone W Scapular Retraction - 2 x daily - 7 x weekly - 1 sets - 10 reps

## 2020-07-20 NOTE — Telephone Encounter (Signed)
RF from faxed from [place] Visit: 03/08/2020 Next Visit: 07/23/2020 Labs:  07/09/2020, CBC WNL. Creatinine is borderline elevated-1.01 and GFR is slightly low-54. Please notify the patient and advise her to avoid NSAIDs. Total protein is low-5.8. We will continue to monitor lab work closely.  Eye exam: not performed, LMOM for patient to get PLQ eye exam   Current Dose per office note 03/08/2020, hydroxychloroquine 200 mg p.o. twice daily Monday to Friday [id number] inflammatory arthritis   Last Fill: 03/08/2020  Okay to refill Plaquenil?

## 2020-07-23 ENCOUNTER — Ambulatory Visit (INDEPENDENT_AMBULATORY_CARE_PROVIDER_SITE_OTHER): Payer: Medicare Other | Admitting: Rheumatology

## 2020-07-23 ENCOUNTER — Encounter: Payer: Self-pay | Admitting: Rheumatology

## 2020-07-23 ENCOUNTER — Other Ambulatory Visit: Payer: Self-pay

## 2020-07-23 VITALS — BP 112/64 | HR 59 | Resp 16 | Ht 66.0 in | Wt 143.0 lb

## 2020-07-23 DIAGNOSIS — Z8619 Personal history of other infectious and parasitic diseases: Secondary | ICD-10-CM

## 2020-07-23 DIAGNOSIS — M79642 Pain in left hand: Secondary | ICD-10-CM | POA: Diagnosis not present

## 2020-07-23 DIAGNOSIS — F5101 Primary insomnia: Secondary | ICD-10-CM | POA: Diagnosis not present

## 2020-07-23 DIAGNOSIS — S92155D Nondisplaced avulsion fracture (chip fracture) of left talus, subsequent encounter for fracture with routine healing: Secondary | ICD-10-CM

## 2020-07-23 DIAGNOSIS — M5030 Other cervical disc degeneration, unspecified cervical region: Secondary | ICD-10-CM

## 2020-07-23 DIAGNOSIS — M79671 Pain in right foot: Secondary | ICD-10-CM

## 2020-07-23 DIAGNOSIS — F419 Anxiety disorder, unspecified: Secondary | ICD-10-CM | POA: Diagnosis not present

## 2020-07-23 DIAGNOSIS — R7989 Other specified abnormal findings of blood chemistry: Secondary | ICD-10-CM

## 2020-07-23 DIAGNOSIS — M79641 Pain in right hand: Secondary | ICD-10-CM

## 2020-07-23 DIAGNOSIS — M0579 Rheumatoid arthritis with rheumatoid factor of multiple sites without organ or systems involvement: Secondary | ICD-10-CM

## 2020-07-23 DIAGNOSIS — M79672 Pain in left foot: Secondary | ICD-10-CM

## 2020-07-23 DIAGNOSIS — M1712 Unilateral primary osteoarthritis, left knee: Secondary | ICD-10-CM

## 2020-07-23 DIAGNOSIS — M0609 Rheumatoid arthritis without rheumatoid factor, multiple sites: Secondary | ICD-10-CM

## 2020-07-23 DIAGNOSIS — Z79899 Other long term (current) drug therapy: Secondary | ICD-10-CM

## 2020-07-23 NOTE — Patient Instructions (Addendum)
Standing Labs We placed an order today for your standing lab work.   Please have your standing labs drawn in  1 week, June and then every 5 months  If possible, please have your labs drawn 2 weeks prior to your appointment so that the provider can discuss your results at your appointment.  We have open lab daily Monday through Thursday from 1:30-4:30 PM and Friday from 1:30-4:00 PM at the office of Dr. Bo Merino, [facility].   Please be advised, all patients with office appointments requiring lab work will take precedents over walk-in lab work.  If possible, please come for your lab work on Monday and Friday afternoons, as you may experience shorter wait times. The office is located at [address], [place], [place], [place] [address] No appointment is necessary.   Labs are drawn by [facility]. Please bring your co-pay at the time of your lab draw.  You may receive a bill from [facility] for your lab work.  If you wish to have your labs drawn at another location, please call the office 24 hours in advance to send orders.  If you have any questions regarding directions or hours of operation,  please call [phone].   As a reminder, please drink plenty of water prior to coming for your lab work. Thanks!  *Your  Plaquenil eye exam is due in a month.  Please have your ophthalmologist fax [place] the results of your exam to [phone]*

## 2020-07-24 ENCOUNTER — Encounter: Payer: Self-pay | Admitting: Rheumatology

## 2020-07-27 ENCOUNTER — Other Ambulatory Visit: Payer: Self-pay

## 2020-07-27 ENCOUNTER — Ambulatory Visit (INDEPENDENT_AMBULATORY_CARE_PROVIDER_SITE_OTHER): Payer: Medicare Other | Admitting: Rehabilitative and Restorative Service Providers"

## 2020-07-27 ENCOUNTER — Encounter: Payer: Self-pay | Admitting: Rehabilitative and Restorative Service Providers"

## 2020-07-27 ENCOUNTER — Ambulatory Visit (INDEPENDENT_AMBULATORY_CARE_PROVIDER_SITE_OTHER): Payer: Medicare Other | Admitting: Sports Medicine

## 2020-07-27 DIAGNOSIS — M25532 Pain in left wrist: Secondary | ICD-10-CM

## 2020-07-27 DIAGNOSIS — M542 Cervicalgia: Secondary | ICD-10-CM

## 2020-07-27 DIAGNOSIS — M25531 Pain in right wrist: Secondary | ICD-10-CM

## 2020-07-27 DIAGNOSIS — M5412 Radiculopathy, cervical region: Secondary | ICD-10-CM | POA: Diagnosis not present

## 2020-07-27 DIAGNOSIS — M6281 Muscle weakness (generalized): Secondary | ICD-10-CM | POA: Diagnosis not present

## 2020-07-27 MED ORDER — GABAPENTIN 300 MG PO CAPS: ORAL_CAPSULE | ORAL | 3 refills | Status: DC

## 2020-07-27 MED ORDER — TRAMADOL HCL 50 MG PO TABS: 50.0000 mg | ORAL_TABLET | Freq: Three times a day (TID) | ORAL | 0 refills | Status: DC | PRN

## 2020-07-27 NOTE — Progress Notes (Signed)
    Procedures performed today:    None.  Independent interpretation of notes and tests performed by another provider:   None.  Brief History, Exam, Impression, and Recommendations:    Radiculitis of right cervical region This is a pleasant 75 year old female, she has known multilevel cervical DDD, right-sided neck pain. She has done a couple sessions of physical therapy and things have gone really well. She never need an epidural. I will refill her tramadol, she will take this occasionally with gabapentin. And we can continue this long-term. Of note she was complaining of widespread hyperalgesia consistent with fibromyalgia as well.    ___________________________________________ Gwen Her. Dianah Field, M.D., ABFM., CAQSM. Primary Care and [place] Instructor of [employer] [facility] [employer] [facility]

## 2020-07-27 NOTE — Assessment & Plan Note (Signed)
This is a pleasant 75 year old female, she has known multilevel cervical DDD, right-sided neck pain. She has done a couple sessions of physical therapy and things have gone really well. She never need an epidural. I will refill her tramadol, she will take this occasionally with gabapentin. And we can continue this long-term. Of note she was complaining of widespread hyperalgesia consistent with fibromyalgia as well.

## 2020-07-27 NOTE — Therapy (Signed)
[place], [place], [address] Phone: [phone]   Fax:  [phone]  Physical Therapy Treatment  Patient Details  Name: Kristen Jacobs MRN: [id number] Date of Birth: [birth year] Referring Provider (PT): Aundria Mems, MD   Encounter Date: 07/27/2020   PT End of Session - 07/27/20 1325    Visit Number 5    Number of Visits 12    Date for PT Re-Evaluation 08/06/20    Authorization Type medicare    PT Start Time 1320    PT Stop Time 1350    PT Time Calculation (min) 30 min    Activity Tolerance Patient tolerated treatment well    Behavior During Therapy [facility] for tasks assessed/performed           Past Medical History:  Diagnosis Date  . Constipation   . Hepatitis   . Joint pain     Past Surgical History:  Procedure Laterality Date  . BREAST BIOPSY    . CATARACT EXTRACTION Right   . LAPAROSCOPY    . LIVER BIOPSY    . OOPHORECTOMY     right   . TONSILLECTOMY     age 78     There were no vitals filed for this visit.   Subjective Assessment - 07/27/20 1321    Subjective The patient is not sleeping well and she doesn't feel well today.  "Everything is hurting".  Pain is worse than usual and she cannot take anything to reduce pain.  She stopped doing ther ex on Monday due to not sleeping well and feeling bad.    Pertinent History RA, osteopenia,    Patient Stated Goals decrease pain in the left wrist and be able to use L UE without pain    Currently in Pain? Yes    Pain Score 2    without moving; wrists are more painful today too   Pain Location Generalized    Aggravating Factors  R wrist is 4/10 with movement, L wrist is 8/10 with movement    Pain Relieving Factors neck is somewhat improved              [facility] PT Assessment - 07/27/20 1324      Assessment   Medical Diagnosis bilateral wrist pain; cervicalgia    Referring Provider (PT) Aundria Mems, MD    Onset  Date/Surgical Date 06/18/20                         [facility] Adult PT Treatment/Exercise - 07/27/20 1324      Exercises   Exercises Wrist;Neck;Hand;Other Exercises    Other Exercises  walking-- discussed on weeks she cannot tolerate pilates, walking is a good option      Neck Exercises: Seated   Neck Retraction 5 reps      Neck Exercises: Supine   Other Supine Exercise towel roll stretch supine for pectoralis stretching    Other Supine Exercise towel perpendicular to spine for thoracic opening      Neck Exercises: Sidelying   Other Sidelying Exercise open book exercise R and L sides to tolerance                       PT Long Term Goals - 07/27/20 1325      PT LONG TERM GOAL #1   Title The patient will be indep with HEP for wrist and UE strengthening.    Time 6  Period Weeks    Status On-going    Target Date 08/06/20      PT LONG TERM GOAL #2   Title The patient will improve functional status score from 35% to > or equal to 55%.    Baseline 49%    Time 6    Period Weeks    Status Partially Met    Target Date 08/06/20      PT LONG TERM GOAL #3   Title The patient will report pain < or equal to 6/10 at its worst.    Baseline 10/10 at eval; now no pain at rest and no sharp, shooting pains    Time 6    Period Weeks    Status Achieved    Target Date 08/06/20      PT LONG TERM GOAL #4   Title The patient will improve pincher grasp to 4 lbs bilaterally to improve ability to donn leggings.    Time 6    Period Days    Status Partially Met    Target Date 08/06/20      PT LONG TERM GOAL #5   Title The patient will improve L grip strength from 40 lbs to 45 lbs.    Time 6    Period Weeks    Status On-going    Target Date 08/06/20      PT LONG TERM GOAL #6   Title The patient will return demo HEP for cervical spine.    Time 6    Period Weeks    Target Date 08/06/20      PT LONG TERM GOAL #7   Title The patient will improve cervical  rotation to 75 degrees bilaterally.    Time 6    Period Weeks    Target Date 08/06/20                 Plan - 07/27/20 1359    Clinical Impression Statement The patient is not having a good week and notes generalized muscle pain.  PT modified HEP slightly and recommended gentle walking to initiate movement.  Plan to work to [employer].    PT Treatment/Interventions ADLs/Self Care Home Management;Patient/family education;Therapeutic exercise;Therapeutic activities;Taping;Dry needling;Manual techniques;Cryotherapy;Iontophoresis 69m/ml Dexamethasone;Moist Heat;Electrical Stimulation;Traction    PT Next Visit Plan check HEP and progress straight plane strengthening, STM and DN as needed; compensatory strategies for ADLs (cutting and donning leggings); for neck continue STM/DN, work on stabilization as well    PT Home Exercise Plan Access Code: [email or web address]   Consulted and Agree with Plan of Care Patient           Patient will benefit from skilled therapeutic intervention in order to improve the following deficits and impairments:     Visit Diagnosis: Pain in left wrist  Pain in right wrist  Muscle weakness (generalized)  Cervicalgia     Problem List Patient Active Problem List   Diagnosis Date Noted  . Rheumatoid factor positive 02/17/2020  . Avulsion fracture of left talus 09/05/2019  . Primary osteoarthritis of left knee 07/19/2019  . Subacromial bursitis of left shoulder joint 07/19/2019  . 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 Radiculitis of right cervical region 11/12/2017  . Chronic right shoulder pain 11/12/2017  . Pain of right sacroiliac joint 09/10/2017  . Hip flexor tendinitis, right 08/11/2017    Kaylinn Dedic, PT 07/27/2020, 2:03 PM  [facility] [address]Phone: [phone]  Fax:  [phone]  Name: Kristen Jacobs MRN:  [id number] Date of Birth: 1945-11-09  [id number] Date of Birth: 1945-11-09

## 2020-08-03 ENCOUNTER — Ambulatory Visit (INDEPENDENT_AMBULATORY_CARE_PROVIDER_SITE_OTHER): Payer: Medicare Other | Admitting: Rehabilitative and Restorative Service Providers"

## 2020-08-03 ENCOUNTER — Other Ambulatory Visit: Payer: Self-pay

## 2020-08-03 DIAGNOSIS — M25531 Pain in right wrist: Secondary | ICD-10-CM

## 2020-08-03 DIAGNOSIS — M6281 Muscle weakness (generalized): Secondary | ICD-10-CM

## 2020-08-03 DIAGNOSIS — M542 Cervicalgia: Secondary | ICD-10-CM | POA: Diagnosis not present

## 2020-08-03 DIAGNOSIS — M25532 Pain in left wrist: Secondary | ICD-10-CM

## 2020-08-03 NOTE — Therapy (Signed)
[place], [place], [address] Phone: [phone]   Fax:  [phone]  Physical Therapy Treatment  Patient Details  Name: Kristen Jacobs MRN: [id number] Date of Birth: 24-Dec-1945 Referring Provider (PT): Aundria Mems, MD   Encounter Date: 08/03/2020   PT End of Session - 08/03/20 1451    Visit Number 6    Number of Visits 12    Date for PT Re-Evaluation 08/06/20    Authorization Type medicare    PT Start Time 4818    PT Stop Time 1528    PT Time Calculation (min) 41 min    Activity Tolerance Patient tolerated treatment well    Behavior During Therapy [facility] for tasks assessed/performed           Past Medical History:  Diagnosis Date  . Constipation   . Hepatitis   . Joint pain     Past Surgical History:  Procedure Laterality Date  . BREAST BIOPSY    . CATARACT EXTRACTION Right   . LAPAROSCOPY    . LIVER BIOPSY    . OOPHORECTOMY     right   . TONSILLECTOMY     age 89     There were no vitals filed for this visit.   Subjective Assessment - 08/03/20 1448    Subjective The patient is having a good day.  She got an order for gabapentin, but does not plan to fill it b/c it is too strong.  She is taking tramadol.  "The neck has been bad."    Pertinent History RA, osteopenia,    Patient Stated Goals decrease pain in the left wrist and be able to use L UE without pain    Currently in Pain? Yes    Pain Score 4     Pain Location Neck    Pain Orientation Right    Pain Descriptors / Indicators Aching    Pain Type Chronic pain    Pain Onset More than a month ago    Pain Frequency Constant    Aggravating Factors  movement    Pain Relieving Factors tramadol and stretching helps              [facility] PT Assessment - 08/03/20 1452      Assessment   Medical Diagnosis bilateral wrist pain; cervicalgia    Referring Provider (PT) Aundria Mems, MD      AROM   Cervical - Right  Rotation 70    Cervical - Left Rotation 70                         [facility] Adult PT Treatment/Exercise - 08/03/20 1724      Exercises   Exercises Shoulder;Neck      Neck Exercises: Machines for Strengthening   UBE (Upper Arm Bike) L2 x 2.5 minutes forward/1 backward      Neck Exercises: Standing   Wall Push Ups 10 reps    Wall Push Ups Limitations on elbows      Neck Exercises: Seated   Cervical Rotation Right;Left;5 reps      Shoulder Exercises: Supine   Protraction Strengthening;Both;10 reps    Protraction Weight (lbs) 2    Protraction Limitations supine serratus punches      Shoulder Exercises: Standing   Protraction Strengthening;Both;10 reps      Manual Therapy   Manual Therapy Soft tissue mobilization    Manual therapy comments skilled palpation to assess soft  tissue response to DN    Soft tissue mobilization R infraspinatus, R rhomboids, R upper trap            Trigger Point Dry Needling - 08/03/20 1729    Consent Given? Yes    Education Handout Provided Previously provided    Muscles Treated Upper Quadrant Infraspinatus    Dry Needling Comments R side    Infraspinatus Response Twitch response elicited;Palpable increased muscle length                PT Education - 08/03/20 1720    Education Details HEP    Person(s) Educated Patient    Methods Explanation;Demonstration;Handout    Comprehension Verbalized understanding;Returned demonstration               PT Long Term Goals - 08/03/20 1453      PT LONG TERM GOAL #1   Title The patient will be indep with HEP for wrist and UE strengthening.    Time 6    Period Weeks    Status On-going      PT LONG TERM GOAL #2   Title The patient will improve functional status score from 35% to > or equal to 55%.    Baseline 49%    Time 6    Period Weeks    Status Partially Met      PT LONG TERM GOAL #3   Title The patient will report pain < or equal to 6/10 at its worst.    Baseline  10/10 at eval; now no pain at rest and no sharp, shooting pains    Time 6    Period Weeks    Status Achieved      PT LONG TERM GOAL #4   Title The patient will improve pincher grasp to 4 lbs bilaterally to improve ability to donn leggings.    Time 6    Period Days    Status Partially Met      PT LONG TERM GOAL #5   Title The patient will improve L grip strength from 40 lbs to 45 lbs.    Time 6    Period Weeks    Status On-going      PT LONG TERM GOAL #6   Title The patient will return demo HEP for cervical spine.    Time 6    Period Weeks      PT LONG TERM GOAL #7   Title The patient will improve cervical rotation to 75 degrees bilaterally.    Baseline 70 degrees bilaterally    Time 6    Period Weeks    Status Partially Met                 Plan - 08/03/20 1720    Clinical Impression Statement The patient is improved this week -- condition varies depending on weather, sleep, etc.  She has improved cervical ROM.  R infraspinatus musculature tight and responded well to DN.  Also worked on scapular stabilization to improve strength.  Plan to check goals next visit and renew 1x/week for 6 weeks.    PT Treatment/Interventions ADLs/Self Care Home Management;Patient/family education;Therapeutic exercise;Therapeutic activities;Taping;Dry needling;Manual techniques;Cryotherapy;Iontophoresis 40m/ml Dexamethasone;Moist Heat;Electrical Stimulation;Traction    PT Next Visit Plan check goals, renew 1x/week for 6 weeks as needed. STM and DN as needed; compensatory strategies for ADLs (cutting and donning leggings); for neck continue STM/DN, work on stabilization as well    PT Home Exercise Plan Access Code: [email or web address]  Consulted and Agree with Plan of Care Patient           Patient will benefit from skilled therapeutic intervention in order to improve the following deficits and impairments:     Visit Diagnosis: Pain in left wrist  Pain in right wrist  Muscle weakness  (generalized)  Cervicalgia     Problem List Patient Active Problem List   Diagnosis Date Noted  . Rheumatoid factor positive 02/17/2020  . Avulsion fracture of left talus 09/05/2019  . Primary osteoarthritis of left knee 07/19/2019  . Subacromial bursitis of left shoulder joint 07/19/2019  . 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 Radiculitis of right cervical region 11/12/2017  . Chronic right shoulder pain 11/12/2017  . Pain of right sacroiliac joint 09/10/2017  . Hip flexor tendinitis, right 08/11/2017    Johnnie Goynes, PT 08/03/2020, 5:30 PM  [facility] [place], [place], [address] Phone: [phone]   Fax:  [phone]  Name: Kristen Jacobs MRN: [id number] Date of Birth: 1945-12-25

## 2020-08-03 NOTE — Patient Instructions (Signed)
Access Code: [email or web address] URL: [email or web address] Date: 08/03/2020 Prepared by: Rudell Cobb  Exercises Standing Wrist Extensor Stretch with Arm Straight - 2 x daily - 7 x weekly - 1 sets - 2 reps - 20-30 seconds hold Seated Wrist Extension Stretch - 2 x daily - 7 x weekly - 1 sets - 10 reps Seated Wrist Extension with Dumbbell - 2 x daily - 7 x weekly - 1 sets - 5-8 reps Seated Wrist Flexion with Dumbbell - 2 x daily - 7 x weekly - 1 sets - 5-8 reps Standing Upper Trapezius Stretch - 2 x daily - 7 x weekly - 1 sets - 3 reps - 30 seconds hold Standing Upper Trapezius Mobilization with Small Ball - 2 x daily - 7 x weekly - 1 sets - 1 reps - 1 minute hold Seated Cervical Retraction - 2 x daily - 7 x weekly - 1 sets - 10 reps Gentle Levator Scapulae Stretch - 2 x daily - 7 x weekly - 1 sets - 3 reps - 20 reps hold Doorway Pec Stretch at 60 Degrees Abduction with Arm Straight - 2 x daily - 7 x weekly - 13 sets - 2 reps - 30 seconds hold Doorway Pec Stretch at 60 Elevation - 2 x daily - 7 x weekly - 1 sets - 2 reps - 30 seconds hold Prone Scapular Slide with Shoulder Extension - 2 x daily - 7 x weekly - 1 sets - 10 reps Prone W Scapular Retraction - 2 x daily - 7 x weekly - 1 sets - 10 reps Sidelying Open Book Thoracic Lumbar Rotation and Extension - 2 x daily - 7 x weekly - 1 sets - 10 reps Supine Scapular Protraction in Flexion with Dumbbells - 2 x daily - 7 x weekly - 1 sets - 10 reps

## 2020-08-06 ENCOUNTER — Encounter: Payer: Self-pay | Admitting: Rheumatology

## 2020-08-07 ENCOUNTER — Other Ambulatory Visit: Payer: Self-pay | Admitting: *Deleted

## 2020-08-07 DIAGNOSIS — Z79899 Other long term (current) drug therapy: Secondary | ICD-10-CM | POA: Diagnosis not present

## 2020-08-07 NOTE — Telephone Encounter (Signed)
Creatinine was borderline elevated and GFR is slightly low-54.  We do not recommend the use of NSAIDs with abnormal renal function.  Dr. Estanislado Pandy placed a future order for BMP to be rechecked, so please advise the patient to wait to try diclofenac gel until he has had updated lab work.

## 2020-08-08 ENCOUNTER — Telehealth: Payer: Self-pay

## 2020-08-08 LAB — BASIC METABOLIC PANEL WITH GFR
BUN/Creatinine Ratio: 12 (calc) (ref 6–22)
BUN: 12 mg/dL (ref 7–25)
CO2: 30 mmol/L (ref 20–32)
Calcium: 10 mg/dL (ref 8.6–10.4)
Chloride: 102 mmol/L (ref 98–110)
Creat: 1.01 mg/dL — ABNORMAL HIGH (ref 0.60–0.93)
GFR, Est African American: 63 mL/min/{1.73_m2} (ref >=60)
GFR, Est Non African American: 54 mL/min/{1.73_m2} — ABNORMAL LOW (ref >=60)
Glucose, Bld: 86 mg/dL (ref 65–99)
Potassium: 5.4 mmol/L — ABNORMAL HIGH (ref 3.5–5.3)
Sodium: 138 mmol/L (ref 135–146)

## 2020-08-08 NOTE — Progress Notes (Signed)
Creatinine is mildly elevated and potassium is elevated.  Please notify patient and forward labs to his PCP for evaluation.

## 2020-08-08 NOTE — Telephone Encounter (Signed)
faxed

## 2020-08-08 NOTE — Telephone Encounter (Signed)
Patient called requesting her labwork results be faxed to her PCP Dr. Greig Right.  Fax [phone]

## 2020-08-10 ENCOUNTER — Other Ambulatory Visit: Payer: Self-pay

## 2020-08-10 ENCOUNTER — Ambulatory Visit (INDEPENDENT_AMBULATORY_CARE_PROVIDER_SITE_OTHER): Payer: Medicare Other | Admitting: Rehabilitative and Restorative Service Providers"

## 2020-08-10 DIAGNOSIS — M6281 Muscle weakness (generalized): Secondary | ICD-10-CM

## 2020-08-10 DIAGNOSIS — M542 Cervicalgia: Secondary | ICD-10-CM

## 2020-08-10 NOTE — Therapy (Signed)
[place], [place], [address] Phone: [phone]   Fax:  [phone]  Physical Therapy Treatment and Renewal Summary  Patient Details  Name: Kristen Jacobs MRN: [id number] Date of Birth: June 07, 1945 Referring Provider (PT): Aundria Mems, MD   Encounter Date: 08/10/2020   PT End of Session - 08/10/20 1436    Visit Number 7    Number of Visits 12    Date for PT Re-Evaluation 09/09/20    Authorization Type medicare    PT Start Time 1322    PT Stop Time 1405    PT Time Calculation (min) 43 min    Activity Tolerance Patient tolerated treatment well    Behavior During Therapy [facility] for tasks assessed/performed           Past Medical History:  Diagnosis Date  . Constipation   . Hepatitis   . Joint pain     Past Surgical History:  Procedure Laterality Date  . BREAST BIOPSY    . CATARACT EXTRACTION Right   . LAPAROSCOPY    . LIVER BIOPSY    . OOPHORECTOMY     right   . TONSILLECTOMY     age 75     There were no vitals filed for this visit.   Subjective Assessment - 08/10/20 1328    Subjective The patient feels she is getting stronger.  Her neck pain comes and goes.  She is able to do more exercise with her wrists.    Patient Stated Goals decrease pain in the left wrist and be able to use L UE without pain    Currently in Pain? Yes    Pain Location Neck    Pain Orientation Right    Pain Descriptors / Indicators Aching    Pain Onset More than a month ago    Pain Frequency Constant    Aggravating Factors  movement    Pain Relieving Factors stretching helps              [facility] PT Assessment - 08/10/20 1336      Assessment   Medical Diagnosis bilateral wrist pain; cervicalgia    Referring Provider (PT) Aundria Mems, MD    Onset Date/Surgical Date 06/18/20      AROM   Cervical - Right Rotation 70    Cervical - Left Rotation 62                          [facility] Adult PT Treatment/Exercise - 08/10/20 1337      Exercises   Exercises Shoulder;Neck      Neck Exercises: Machines for Strengthening   UBE (Upper Arm Bike) L2 x 3 minutes forward/ 1 minute backward      Neck Exercises: Seated   Cervical Rotation Right;Left;5 reps      Shoulder Exercises: Prone   Retraction Strengthening;Both;10 reps    Flexion Strengthening;Right;Left;10 reps    Extension Strengthening;Both;10 reps    Horizontal ABduction 1 Strengthening;Both;10 reps      Shoulder Exercises: Stretch   Corner Stretch Limitations door frame stretch x 30 seconds x 3 reps      Manual Therapy   Manual Therapy Soft tissue mobilization    Manual therapy comments skilled palpation to assess soft tissue response to DN    Soft tissue mobilization R infraspinatus, rhomboids, levator, upper trap and suboccipitals            Trigger Point Dry Needling -  08/10/20 1354    Consent Given? Yes    Education Handout Provided Previously provided    Muscles Treated Head and Neck Upper trapezius;Levator scapulae;Suboccipitals    Muscles Treated Upper Quadrant Infraspinatus;Subscapularis    Dry Needling Comments R side                     PT Long Term Goals - 08/10/20 1321      PT LONG TERM GOAL #1   Title The patient will be indep with HEP for wrist and UE strengthening.    Time 6    Period Weeks    Status Achieved      PT LONG TERM GOAL #2   Title The patient will improve functional status score from 35% to > or equal to 55%.    Baseline 49%    Time 6    Period Weeks    Status Partially Met      PT LONG TERM GOAL #3   Title The patient will report pain < or equal to 6/10 at its worst.    Baseline 10/10 at eval; now no pain at rest and no sharp, shooting pains    Time 6    Period Weeks    Status Achieved      PT LONG TERM GOAL #4   Title The patient will improve pincher grasp to 4 lbs bilaterally to improve ability to donn leggings.    Time 6    Period Days     Status Partially Met      PT LONG TERM GOAL #5   Title The patient will improve L grip strength from 40 lbs to 45 lbs.    Time 6    Period Weeks    Status On-going      PT LONG TERM GOAL #6   Title The patient will return demo HEP for cervical spine.    Time 6    Period Weeks    Status Achieved      PT LONG TERM GOAL #7   Title The patient will improve cervical rotation to 75 degrees bilaterally.    Baseline 70 degrees bilaterally    Time 6    Period Weeks    Status Partially Met          UPDATED LONG TERM GOALS:  PT Long Term Goals - 08/10/20 1624      PT LONG TERM GOAL #1   Title The patient will be indep with HEP progression.    Time 4    Period Weeks    Status Revised    Target Date 09/09/20                 Plan - 08/10/20 1620    Clinical Impression Statement The patient has partially met LTGs.  PT is continuing to progress ther ex and strengthening to patient tolerance.  She also responds well to soft tissue mobilization for suboccipitals and parascapular musculature.    Rehab Potential Good    PT Frequency 1x / week    PT Duration 4 weeks    PT Treatment/Interventions ADLs/Self Care Home Management;Patient/family education;Therapeutic exercise;Therapeutic activities;Taping;Dry needling;Manual techniques;Cryotherapy;Iontophoresis 5m/ml Dexamethasone;Moist Heat;Electrical Stimulation;Traction    PT Next Visit Plan check HEP and progress to patient tolerance.    PT Home Exercise Plan Access Code: [email or web address]   Consulted and Agree with Plan of Care Patient           Patient will benefit from skilled therapeutic intervention  in order to improve the following deficits and impairments:  Pain,Increased fascial restricitons,Decreased strength,Decreased range of motion  Visit Diagnosis: Muscle weakness (generalized)  Cervicalgia     Problem List Patient Active Problem List   Diagnosis Date Noted  . Rheumatoid factor positive 02/17/2020  . Avulsion  fracture of left talus 09/05/2019  . Primary osteoarthritis of left knee 07/19/2019  . Subacromial bursitis of left shoulder joint 07/19/2019  . 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 Radiculitis of right cervical region 11/12/2017  . Chronic right shoulder pain 11/12/2017  . Pain of right sacroiliac joint 09/10/2017  . Hip flexor tendinitis, right 08/11/2017    Keri Tavella, PT 08/10/2020, 4:23 PM  [facility] [place], [place], [address] Phone: [phone]   Fax:  [phone]  Name: Kamara Allan MRN: [id number] Date of Birth: 1945-11-18

## 2020-08-14 ENCOUNTER — Telehealth: Payer: Self-pay | Admitting: Rheumatology

## 2020-08-14 DIAGNOSIS — H524 Presbyopia: Secondary | ICD-10-CM | POA: Diagnosis not present

## 2020-08-14 DIAGNOSIS — H52203 Unspecified astigmatism, bilateral: Secondary | ICD-10-CM | POA: Diagnosis not present

## 2020-08-14 DIAGNOSIS — Z79899 Other long term (current) drug therapy: Secondary | ICD-10-CM | POA: Diagnosis not present

## 2020-08-14 NOTE — Telephone Encounter (Signed)
Patient calling because she could not find her Plaquenil Eye Exam form she was given. Please fax Plaquenil Eye Exam form to Dr. Satira Sark at [facility]. Phone # [phone]. Patient going today for appointment.

## 2020-08-14 NOTE — Telephone Encounter (Signed)
Faxed

## 2020-08-16 ENCOUNTER — Other Ambulatory Visit: Payer: Self-pay

## 2020-08-16 DIAGNOSIS — R799 Abnormal finding of blood chemistry, unspecified: Secondary | ICD-10-CM | POA: Diagnosis not present

## 2020-08-16 DIAGNOSIS — M5412 Radiculopathy, cervical region: Secondary | ICD-10-CM

## 2020-08-16 MED ORDER — TRAMADOL HCL 50 MG PO TABS: 50.0000 mg | ORAL_TABLET | Freq: Three times a day (TID) | ORAL | 0 refills | Status: DC | PRN

## 2020-08-20 ENCOUNTER — Encounter: Payer: Medicare Other | Admitting: Rehabilitative and Restorative Service Providers"

## 2020-08-27 ENCOUNTER — Encounter: Payer: Medicare Other | Admitting: Rehabilitative and Restorative Service Providers"

## 2020-08-30 ENCOUNTER — Ambulatory Visit (INDEPENDENT_AMBULATORY_CARE_PROVIDER_SITE_OTHER): Payer: Medicare Other | Admitting: Rehabilitative and Restorative Service Providers"

## 2020-08-30 ENCOUNTER — Other Ambulatory Visit: Payer: Self-pay

## 2020-08-30 ENCOUNTER — Encounter: Payer: Self-pay | Admitting: Rehabilitative and Restorative Service Providers"

## 2020-08-30 DIAGNOSIS — M6281 Muscle weakness (generalized): Secondary | ICD-10-CM

## 2020-08-30 DIAGNOSIS — M542 Cervicalgia: Secondary | ICD-10-CM

## 2020-08-30 NOTE — Therapy (Signed)
[place], [place], [address] Phone: [phone]   Fax:  [phone]  Physical Therapy Treatment  Patient Details  Name: Kristen Jacobs MRN: [id number] Date of Birth: 09-23-1945 Referring Provider (PT): Aundria Mems, MD   Encounter Date: 08/30/2020   PT End of Session - 08/30/20 1426    Visit Number 8    Number of Visits 12    Date for PT Re-Evaluation 09/09/20    Authorization Type medicare    PT Start Time 1424    PT Stop Time 1452    PT Time Calculation (min) 28 min    Activity Tolerance Patient tolerated treatment well    Behavior During Therapy [facility] for tasks assessed/performed           Past Medical History:  Diagnosis Date  . Constipation   . Hepatitis   . Joint pain     Past Surgical History:  Procedure Laterality Date  . BREAST BIOPSY    . CATARACT EXTRACTION Right   . LAPAROSCOPY    . LIVER BIOPSY    . OOPHORECTOMY     right   . TONSILLECTOMY     age 41     There were no vitals filed for this visit.   Subjective Assessment - 08/30/20 1425    Subjective The patient is doing well with home exercises and no longer has scapular pain.  She is tolerating home exercise with her wrists and neck.    Pertinent History RA, osteopenia,    Patient Stated Goals decrease pain in the left wrist and be able to use L UE without pain    Currently in Pain? Yes    Pain Descriptors / Indicators Aching    Pain Type Chronic pain    Aggravating Factors  sore in suboccipitals and R upper trap due to stress today              [facility] PT Assessment - 08/30/20 1448      Assessment   Medical Diagnosis cervicalgia    Referring Provider (PT) Aundria Mems, MD    Onset Date/Surgical Date 06/18/20    Hand Dominance Right                         [facility] Adult PT Treatment/Exercise - 08/30/20 1452      Self-Care   Self-Care Other Self-Care Comments    Other  Self-Care Comments  discussed HEP progression; modified prone scapular retraction to standing from prone      Exercises   Exercises Shoulder      Neck Exercises: Standing   Other Standing Exercises neck retraction, shoulder retraction and shoulder rolls      Manual Therapy   Manual Therapy Soft tissue mobilization    Manual therapy comments skilled palpation to assess soft tissue response to DN            Trigger Point Dry Needling - 08/30/20 1455    Consent Given? Yes    Education Handout Provided Previously provided    Muscles Treated Head and Neck Upper trapezius;Suboccipitals;Cervical multifidi    Dry Needling Comments R side    Upper Trapezius Response Twitch reponse elicited;Palpable increased muscle length    Suboccipitals Response Twitch response elicited;Palpable increased muscle length    Levator Scapulae Response Twitch response elicited;Palpable increased muscle length    Cervical multifidi Response Twitch reponse elicited;Palpable increased muscle length  PT Long Term Goals - 08/10/20 1624      PT LONG TERM GOAL #1   Title The patient will be indep with HEP progression.    Time 4    Period Weeks    Status Revised    Target Date 09/09/20                 Plan - 08/30/20 1448    Clinical Impression Statement The patient continues to progress with self mgmt of HEP.  She had increased pain today and resonded well to Dry needling and STM.  Plan to continue HEP and f/u in 2 weeks for one more assessment.    PT Treatment/Interventions ADLs/Self Care Home Management;Patient/family education;Therapeutic exercise;Therapeutic activities;Taping;Dry needling;Manual techniques;Cryotherapy;Iontophoresis 4mg /ml Dexamethasone;Moist Heat;Electrical Stimulation;Traction    PT Next Visit Plan check HEP and progress to patient tolerance.    PT Home Exercise Plan Access Code: [email or web address]    Consulted and Agree with Plan of Care Patient            Patient will benefit from skilled therapeutic intervention in order to improve the following deficits and impairments:  Pain,Increased fascial restricitons,Decreased strength,Decreased range of motion  Visit Diagnosis: Cervicalgia  Muscle weakness (generalized)     Problem List Patient Active Problem List   Diagnosis Date Noted  . Rheumatoid factor positive 02/17/2020  . Avulsion fracture of left talus 09/05/2019  . Primary osteoarthritis of left knee 07/19/2019  . Subacromial bursitis of left shoulder joint 07/19/2019  . 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 Radiculitis of right cervical region 11/12/2017  . Chronic right shoulder pain 11/12/2017  . Pain of right sacroiliac joint 09/10/2017  . Hip flexor tendinitis, right 08/11/2017    Mechelle Pates, PT 08/30/2020, 2:57 PM  [facility] [place], [place], [address] Phone: [phone]   Fax:  [phone]  Name: Kristen Jacobs MRN: [id number] Date of Birth: [birth year]

## 2020-09-17 ENCOUNTER — Encounter: Payer: Self-pay | Admitting: Rehabilitative and Restorative Service Providers"

## 2020-09-17 ENCOUNTER — Encounter: Payer: Medicare Other | Admitting: Rehabilitative and Restorative Service Providers"

## 2020-09-20 ENCOUNTER — Ambulatory Visit (INDEPENDENT_AMBULATORY_CARE_PROVIDER_SITE_OTHER): Payer: Medicare Other | Admitting: Podiatry

## 2020-09-20 ENCOUNTER — Other Ambulatory Visit: Payer: Self-pay

## 2020-09-20 ENCOUNTER — Encounter: Payer: Self-pay | Admitting: Podiatry

## 2020-09-20 DIAGNOSIS — L600 Ingrowing nail: Secondary | ICD-10-CM | POA: Diagnosis not present

## 2020-09-20 MED ORDER — NEOMYCIN-POLYMYXIN-HC 3.5-10000-1 OT SUSP: OTIC | 0 refills | Status: DC

## 2020-09-21 ENCOUNTER — Encounter: Payer: Self-pay | Admitting: Podiatry

## 2020-09-21 ENCOUNTER — Ambulatory Visit (INDEPENDENT_AMBULATORY_CARE_PROVIDER_SITE_OTHER): Payer: Medicare Other | Admitting: Rehabilitative and Restorative Service Providers"

## 2020-09-21 ENCOUNTER — Encounter: Payer: Self-pay | Admitting: Rehabilitative and Restorative Service Providers"

## 2020-09-21 DIAGNOSIS — R799 Abnormal finding of blood chemistry, unspecified: Secondary | ICD-10-CM | POA: Diagnosis not present

## 2020-09-21 DIAGNOSIS — M6281 Muscle weakness (generalized): Secondary | ICD-10-CM

## 2020-09-21 DIAGNOSIS — M542 Cervicalgia: Secondary | ICD-10-CM

## 2020-09-21 NOTE — Patient Instructions (Signed)
Access Code: [email or web address] URL: [email or web address] Date: 09/21/2020 Prepared by: Rudell Cobb  Exercises Standing Wrist Extensor Stretch with Arm Straight - 2 x daily - 7 x weekly - 1 sets - 2 reps - 20-30 seconds hold Seated Wrist Extension Stretch - 2 x daily - 7 x weekly - 1 sets - 10 reps Seated Wrist Extension with Dumbbell - 2 x daily - 7 x weekly - 1 sets - 5-8 reps Seated Wrist Flexion with Dumbbell - 2 x daily - 7 x weekly - 1 sets - 5-8 reps Standing Upper Trapezius Stretch - 2 x daily - 7 x weekly - 1 sets - 3 reps - 30 seconds hold Standing Upper Trapezius Mobilization with Small Ball - 2 x daily - 7 x weekly - 1 sets - 1 reps - 1 minute hold Seated Cervical Retraction - 2 x daily - 7 x weekly - 1 sets - 10 reps Gentle Levator Scapulae Stretch - 2 x daily - 7 x weekly - 1 sets - 3 reps - 20 reps hold Single Arm Shoulder Extension with Anchored Resistance - 2 x daily - 7 x weekly - 1 sets - 10 reps Doorway Pec Stretch at 60 Degrees Abduction with Arm Straight - 2 x daily - 7 x weekly - 13 sets - 2 reps - 30 seconds hold Doorway Pec Stretch at 60 Elevation - 2 x daily - 7 x weekly - 1 sets - 2 reps - 30 seconds hold Sidelying Open Book Thoracic Lumbar Rotation and Extension - 2 x daily - 7 x weekly - 1 sets - 10 reps Supine Scapular Protraction in Flexion with Dumbbells - 2 x daily - 7 x weekly - 1 sets - 10 reps

## 2020-09-21 NOTE — Therapy (Signed)
[place], [place], [address] Phone: [phone]   Fax:  [phone]  Physical Therapy Treatment and discharge summary  Patient Details  Name: Kristen Jacobs MRN: [id number] Date of Birth: [birth year] Referring Provider (PT): Aundria Mems, MD   PHYSICAL THERAPY DISCHARGE SUMMARY  Visits from Start of Care: 9  Current functional level related to goals / functional outcomes: See goals below   Remaining deficits: Intermittent pain due to chronic condition of RA in wrist and neck   Education / Equipment: HEP for wrist ROM, strengthening and shoulder/postural strengthening, community resources  Plan: Patient agrees to discharge.  Patient goals were met. Patient is being discharged due to meeting the stated rehab goals.  ?????        Thank you for the referral of this patient. Rudell Cobb, MPT  Encounter Date: 09/21/2020   PT End of Session - 09/21/20 1414    Visit Number 9    Number of Visits 12    Date for PT Re-Evaluation 09/09/20    Authorization Type medicare    PT Start Time 1410    PT Stop Time 1450    PT Time Calculation (min) 40 min    Activity Tolerance Patient tolerated treatment well    Behavior During Therapy [facility] for tasks assessed/performed           Past Medical History:  Diagnosis Date  . Constipation   . Hepatitis   . Joint pain     Past Surgical History:  Procedure Laterality Date  . BREAST BIOPSY    . CATARACT EXTRACTION Right   . LAPAROSCOPY    . LIVER BIOPSY    . OOPHORECTOMY     right   . TONSILLECTOMY     age 33     There were no vitals filed for this visit.   Subjective Assessment - 09/21/20 1412    Subjective The patient reports her L wrist is still bothersome at time.  She is doing pilates regularly, but not yoga.  The yoga conflicts with another schedule.    Pertinent History RA, osteopenia,    Patient Stated Goals decrease pain in  the left wrist and be able to use L UE without pain    Currently in Pain? Yes    Pain Location Neck    Pain Onset More than a month ago    Aggravating Factors  shoulders are so much better; getting anxious makes pain worse    Pain Relieving Factors stretching and exercise helps              [facility] PT Assessment - 09/21/20 1416      Assessment   Medical Diagnosis cervicalgia    Referring Provider (PT) Aundria Mems, MD    Onset Date/Surgical Date 06/18/20    Hand Dominance Right                         [facility] Adult PT Treatment/Exercise - 09/21/20 1523      Exercises   Exercises Shoulder      Neck Exercises: Machines for Strengthening   UBE (Upper Arm Bike) L3 x 2 minutes forward, L2 1 minute backwards      Neck Exercises: Theraband   Shoulder Extension 10 reps    Shoulder Extension Limitations *due to hand/wrist pain, recommended Patient hook band proximal to elbow for resistance      Shoulder Exercises: Standing   Horizontal ABduction Strengthening;Both;10  reps      Shoulder Exercises: Stretch   Corner Stretch Limitations door frame stretch x 30 seconds x 3 reps      Wrist Exercises   Wrist Flexion AROM;Left;Right    Wrist Extension AROM;Right;Left      Manual Therapy   Manual Therapy Soft tissue mobilization    Manual therapy comments skilled palpation to assess soft tissue response to DN    Soft tissue mobilization STM bilat upper trap and infraspinatus            Trigger Point Dry Needling - 09/21/20 1525    Consent Given? Yes    Education Handout Provided Previously provided    Muscles Treated Head and Neck Upper trapezius;Levator scapulae    Muscles Treated Upper Quadrant Infraspinatus    Dry Needling Comments R and L sides    Upper Trapezius Response Twitch reponse elicited;Palpable increased muscle length    Levator Scapulae Response Twitch response elicited;Palpable increased muscle length    Infraspinatus Response Twitch  response elicited;Palpable increased muscle length                PT Education - 09/21/20 1438    Education Details HEP    Person(s) Educated Patient    Methods Explanation;Demonstration;Handout    Comprehension Verbalized understanding;Returned demonstration               PT Long Term Goals - 09/21/20 1441      PT LONG TERM GOAL #1   Title The patient will be indep with HEP progression.    Time 4    Period Weeks    Status Achieved                 Plan - 09/21/20 1453    Clinical Impression Statement The patient is progressing HEP and feels significant overall improvement in neck pain and ROM.  She gets intermittent pain related to RA that impacts her L wrist and neck/shoulder region.  Patient has met LTGs.    Comorbidities RA, osteopenia    PT Treatment/Interventions ADLs/Self Care Home Management;Patient/family education;Therapeutic exercise;Therapeutic activities;Taping;Dry needling;Manual techniques;Cryotherapy;Iontophoresis 16m/ml Dexamethasone;Moist Heat;Electrical Stimulation;Traction    PT Next Visit Plan discharge    PT Home Exercise Plan Access Code: [email or web address]   Consulted and Agree with Plan of Care Patient           Patient will benefit from skilled therapeutic intervention in order to improve the following deficits and impairments:     Visit Diagnosis: Cervicalgia  Muscle weakness (generalized)     Problem List Patient Active Problem List   Diagnosis Date Noted  . History of hepatitis C 04/05/2020  . Cervical spondylosis with radiculopathy 02/21/2020  . Rheumatoid factor positive 02/17/2020  . Laryngopharyngeal reflux (LPR) 12/20/2019  . Avulsion fracture of left talus 09/05/2019  . Primary osteoarthritis of left knee 07/19/2019  . Subacromial bursitis of left shoulder joint 07/19/2019  . 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  . Radiculitis of right cervical region  11/12/2017  . Chronic right shoulder pain 11/12/2017  . Pain of right sacroiliac joint 09/10/2017  . Hip flexor tendinitis, right 08/11/2017    Kristen Jacobs , PT 09/21/2020, 3:40 PM  [facility] [address]Phone: [phone]  Fax:  [phone] Name: BKasidy GianinoMRN: [id number] of Birth: 5March 17, 1947

## 2020-09-23 NOTE — Progress Notes (Signed)
  Subjective:  Patient ID: Kristen Jacobs, female    DOB: 1945/08/15,  MRN: [id number]  Chief Complaint  Patient presents with  . Nail Problem     (np) Check for ingrowns/routine foot care-needed a Tuesday or Thursday    74 y.o. female presents with the above complaint. History confirmed with patient.  Primary area of pain is the right hallux lateral border which has become ingrown and she is having issues getting the corner out.  The left is also posterior but not as bad.  Objective:  Physical Exam: warm, good capillary refill, no trophic changes or ulcerative lesions, normal DP and PT pulses and normal sensory exam.  Both hallux lateral borders with right worse than left ingrowing corners without paronychia  Assessment:   1. Ingrowing right great toenail   2. Ingrowing left great toenail      Plan:  Patient was evaluated and treated and all questions answered.    Ingrown Nail, right -Patient elects to proceed with minor surgery to remove ingrown toenail today. Consent reviewed and signed by patient. -Ingrown nail excised. See procedure note. -Educated on post-procedure care including soaking. Written instructions provided and reviewed. -Patient to follow up in 2 weeks for nail check. -Discussed with her that she does not have any qualifying conditions for routine regular foot care and this would not be a covered service.  Procedure: Excision of Ingrown Toenail Location: Right 1st toe lateral nail borders. Anesthesia: Lidocaine 1% plain; 1.5 mL and Marcaine 0.5% plain; 1.5 mL, digital block. Skin Prep: Betadine. Dressing: Silvadene; telfa; dry, sterile, compression dressing. Technique: Following skin prep, the toe was exsanguinated and a tourniquet was secured at the base of the toe. The affected nail border was freed, split with a nail splitter, and excised. Chemical matrixectomy was then performed with phenol and irrigated out with alcohol. The tourniquet was then removed and  sterile dressing applied. Disposition: Patient tolerated procedure well. Patient to return in 2 weeks for follow-up.     Return in about 2 weeks (around 10/04/2020) for nail re-check.

## 2020-10-01 ENCOUNTER — Other Ambulatory Visit: Payer: Self-pay | Admitting: Rheumatology

## 2020-10-01 ENCOUNTER — Encounter: Payer: Self-pay | Admitting: Rheumatology

## 2020-10-01 ENCOUNTER — Other Ambulatory Visit: Payer: Self-pay | Admitting: Physician Assistant

## 2020-10-02 NOTE — Telephone Encounter (Signed)
Last Visit: 07/23/2020 Next Visit: 10/25/2020 Labs: 07/09/2020 CBC WNL. Creatinine is borderline elevated-1.01 and GFR is slightly low-54. Eye exam: 08/14/2020  Current Dose per office note 07/23/2020: Plaquenil 200 mg p.o. twice daily Monday to Friday  DX: Rheumatoid arthritis involving multiple sites with positive rheumatoid factor    Okay to refill Plaquenil?

## 2020-10-08 ENCOUNTER — Ambulatory Visit: Payer: Medicare Other | Admitting: Podiatry

## 2020-10-11 NOTE — Progress Notes (Signed)
Office Visit Note  Patient: Kristen Jacobs             Date of Birth: 01-30-1946           MRN: [id number]             PCP: Greig Right, MD Referring: Greig Right, MD Visit Date: 10/25/2020 Occupation: @GUAROCC @  Subjective:  Other (Bilateral hand pain)   History of Present Illness: Kristen Jacobs is a 75 y.o. female with history of rheumatoid arthritis and osteoarthritis.  She states she continues to have some pain and stiffness in her hands which she describes over the ulnar aspect of her bilateral wrist.  She has noticed that her rings have become tighter on her fingers.  She does not have much discomfort in any joints.  She has off-and-on discomfort in her feet.  She states that increased activity causes increased pain.  Activities of Daily Living:  Patient reports morning stiffness for less than 5 minutes.   Patient Denies nocturnal pain.  Difficulty dressing/grooming: Denies Difficulty climbing stairs: Denies Difficulty getting out of chair: Denies Difficulty using hands for taps, buttons, cutlery, and/or writing: Reports  Review of Systems  Constitutional:  Negative for fatigue.  HENT:  Positive for mouth dryness. Negative for mouth sores and nose dryness.   Eyes:  Positive for dryness. Negative for pain and itching.  Respiratory:  Negative for shortness of breath and difficulty breathing.   Cardiovascular:  Negative for chest pain and palpitations.  Gastrointestinal:  Negative for blood in stool, constipation and diarrhea.  Endocrine: Negative for increased urination.  Genitourinary:  Negative for difficulty urinating.  Musculoskeletal:  Positive for joint pain, joint pain, joint swelling, morning stiffness and muscle tenderness. Negative for myalgias, muscle weakness and myalgias.  Skin:  Negative for color change, rash and redness.  Allergic/Immunologic: Negative for susceptible to infections.  Neurological:  Negative for dizziness, numbness, headaches and memory  loss.  Hematological:  Negative for bruising/bleeding tendency.  Psychiatric/Behavioral:  Negative for confusion.    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   Radiculitis of right cervical region 11/12/2017   Chronic right shoulder pain 11/12/2017   Pain of right sacroiliac joint 09/10/2017   Hip flexor tendinitis, right 08/11/2017    Past Medical History:  Diagnosis Date   Constipation    Hepatitis    Joint pain     Family History  Problem Relation Age of Onset   Dementia Mother    Cancer Father    Bipolar disorder Sister    Leukemia Brother    Healthy Son    Asthma Son    Healthy Son    Past Surgical History:  Procedure Laterality Date   BREAST BIOPSY     CATARACT EXTRACTION Right    LAPAROSCOPY     LIVER BIOPSY     OOPHORECTOMY     right    TONSILLECTOMY     age 57    Social History   Social History Narrative   Not on file   Immunization History  Administered Date(s) Administered  Moderna Sars-Covid-2 Vaccination 12/05/2019, 01/02/2020     Objective: Vital Signs: BP 95/61 (BP Location: Left Arm, Patient Position: Sitting, Cuff Size: Normal)   Pulse 73   Resp 14   Ht 5\' 6"  (1.676 m)   Wt 142 lb 3.2 oz (64.5 kg)   BMI 22.95 kg/m    Physical Exam Vitals and nursing note reviewed.  Constitutional:      Appearance: She is well-developed.  HENT:     Head: Normocephalic and atraumatic.  Eyes:     Conjunctiva/sclera: Conjunctivae normal.  Cardiovascular:     Rate and Rhythm: Normal rate and regular  rhythm.     Heart sounds: Normal heart sounds.  Pulmonary:     Effort: Pulmonary effort is normal.     Breath sounds: Normal breath sounds.  Abdominal:     General: Bowel sounds are normal.     Palpations: Abdomen is soft.  Musculoskeletal:     Cervical back: Normal range of motion.  Lymphadenopathy:     Cervical: No cervical adenopathy.  Skin:    General: Skin is warm and dry.     Capillary Refill: Capillary refill takes less than 2 seconds.  Neurological:     Mental Status: She is alert and oriented to person, place, and time.  Psychiatric:        Behavior: Behavior normal.     Musculoskeletal Exam: Spine was in good range of motion.  Shoulder joints, elbow joints, wrist joints, MCPs PIPs and DIPs with good range of motion.  No tenderness or synovitis was noted over wrist joints.  She had bilateral PIP and DIP thickening.  Hip joints and knee joints in good range of motion.  She had prominence of PIP and DIP joints but no synovitis was noted.  CDAI Exam: CDAI Score: 0.4  Patient Global: 2 mm; Provider Global: 2 mm Swollen: 0 ; Tender: 0  Joint Exam 10/25/2020   No joint exam has been documented for this visit   There is currently no information documented on the homunculus. Go to the Rheumatology activity and complete the homunculus joint exam.  Investigation: No additional findings.  Imaging: No results found.  Recent Labs: Lab Results  Component Value Date   WBC 5.1 07/09/2020   HGB 13.5 07/09/2020   PLT 248 07/09/2020   NA 138 08/07/2020   K 5.4 (H) 08/07/2020   CL 102 08/07/2020   CO2 30 08/07/2020   GLUCOSE 86 08/07/2020   BUN 12 08/07/2020   CREATININE 1.01 (H) 08/07/2020   BILITOT 0.3 07/09/2020   AST 16 07/09/2020   ALT 10 07/09/2020   PROT 5.8 (L) 07/09/2020   CALCIUM 10.0 08/07/2020   GFRAA 63 08/07/2020    Speciality Comments: PLQ eye exam WNL 08/14/2020 [facility] f/u 12 months  Procedures:  No procedures performed Allergies:  Other and Sulfa antibiotics   Assessment / Plan:     Visit Diagnoses: Rheumatoid arthritis involving multiple sites with positive rheumatoid factor (HCC) - MRI of the wrist joint showed synovitis, tenosynovitis and possible erosions.  She had no synovitis on my examination today.  She complains of some tenderness over bilateral ulnar styloid.  I we will schedule ultrasound of bilateral hands to look for synovitis.  I also discussed getting Plaquenil level to adjust the dose of hydroxychloroquine but she would like to wait until the follow-up visit.  High risk medication use - Plaquenil 200 mg p.o. twice daily Monday to Friday start date March 08, 2020. PLQ eye  exam WNL 08/14/2020  - Plan: CBC with Differential/Platelet, COMPLETE METABOLIC PANEL WITH GFR  Elevated serum creatinine-we will check creatinine level again.  She states that she takes some natural supplement for inflammation.  Pain in both hands - X-ray of bilateral hands showed cystic versus erosive changes in the carpals.    Primary osteoarthritis of left knee-she denies any discomfort in her knees today.  Pain in both feet - X-rays were consistent with osteoarthritis.  She had no synovitis on examination.  DDD (degenerative disc disease), cervical - Patient is followed by Dr. Arnoldo Morale and goes to physical therapy.    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  Anxiety - On Celexa.  History of hepatitis C - Treated with Harvoni.  Orders: Orders Placed This Encounter  Procedures   CBC with Differential/Platelet   COMPLETE METABOLIC PANEL WITH GFR    No orders of the defined types were placed in this encounter.    Follow-Up Instructions: Return in about 5 months (around 03/27/2021) for Rheumatoid arthritis, Osteoarthritis.   Bo Merino, MD  Note - This record has been created using Editor, commissioning.  Chart creation errors have  been sought, but may not always  have been located. Such creation errors do not reflect on  the standard of medical care.

## 2020-10-18 ENCOUNTER — Encounter: Payer: Medicare Other | Admitting: Internal Medicine

## 2020-10-22 ENCOUNTER — Encounter: Payer: Self-pay | Admitting: Podiatry

## 2020-10-22 DIAGNOSIS — J069 Acute upper respiratory infection, unspecified: Secondary | ICD-10-CM | POA: Diagnosis not present

## 2020-10-23 ENCOUNTER — Ambulatory Visit: Payer: Medicare Other | Admitting: Podiatry

## 2020-10-25 ENCOUNTER — Ambulatory Visit (INDEPENDENT_AMBULATORY_CARE_PROVIDER_SITE_OTHER): Payer: Medicare Other | Admitting: Rheumatology

## 2020-10-25 ENCOUNTER — Encounter: Payer: Self-pay | Admitting: Rheumatology

## 2020-10-25 ENCOUNTER — Other Ambulatory Visit: Payer: Self-pay

## 2020-10-25 VITALS — BP 95/61 | HR 73 | Resp 14 | Ht 66.0 in | Wt 142.2 lb

## 2020-10-25 DIAGNOSIS — F419 Anxiety disorder, unspecified: Secondary | ICD-10-CM

## 2020-10-25 DIAGNOSIS — M0579 Rheumatoid arthritis with rheumatoid factor of multiple sites without organ or systems involvement: Secondary | ICD-10-CM | POA: Diagnosis not present

## 2020-10-25 DIAGNOSIS — M79642 Pain in left hand: Secondary | ICD-10-CM

## 2020-10-25 DIAGNOSIS — M1712 Unilateral primary osteoarthritis, left knee: Secondary | ICD-10-CM | POA: Diagnosis not present

## 2020-10-25 DIAGNOSIS — S92155D Nondisplaced avulsion fracture (chip fracture) of left talus, subsequent encounter for fracture with routine healing: Secondary | ICD-10-CM

## 2020-10-25 DIAGNOSIS — M79671 Pain in right foot: Secondary | ICD-10-CM

## 2020-10-25 DIAGNOSIS — F5101 Primary insomnia: Secondary | ICD-10-CM

## 2020-10-25 DIAGNOSIS — M79672 Pain in left foot: Secondary | ICD-10-CM

## 2020-10-25 DIAGNOSIS — Z8619 Personal history of other infectious and parasitic diseases: Secondary | ICD-10-CM

## 2020-10-25 DIAGNOSIS — R7989 Other specified abnormal findings of blood chemistry: Secondary | ICD-10-CM | POA: Diagnosis not present

## 2020-10-25 DIAGNOSIS — M79641 Pain in right hand: Secondary | ICD-10-CM

## 2020-10-25 DIAGNOSIS — Z79899 Other long term (current) drug therapy: Secondary | ICD-10-CM

## 2020-10-25 DIAGNOSIS — M5030 Other cervical disc degeneration, unspecified cervical region: Secondary | ICD-10-CM

## 2020-10-25 NOTE — Patient Instructions (Signed)
Vaccines You are taking a medication(s) that can suppress your immune system.  The following immunizations are recommended: Flu annually Covid-19  Td/Tdap (tetanus, diphtheria, pertussis) every 10 years Pneumonia (Prevnar 15 then Pneumovax 23 at least 1 year apart.  Alternatively, can take Prevnar 20 without needing additional dose) Shingrix (after age 76): 2 doses from 4 weeks to 6 months apart  Please check with your PCP to make sure you are up to date.   Heart Disease Prevention   Your inflammatory disease increases your risk of heart disease which includes heart attack, stroke, atrial fibrillation (irregular heartbeats), high blood pressure, heart failure and atherosclerosis (plaque in the arteries).  It is important to reduce your risk by:   Keep blood pressure, cholesterol, and blood sugar at healthy levels   Smoking Cessation   Maintain a healthy weight  BMI 20-25   Eat a healthy diet  Plenty of fresh fruit, vegetables, and whole grains  Limit saturated fats, foods high in sodium, and added sugars  DASH and Mediterranean diet   Increase physical activity  Recommend moderate physically activity for 150 minutes per week/ 30 minutes a day for five days a week These can be broken up into three separate ten-minute sessions during the day.   Reduce Stress  Meditation, slow breathing exercises, yoga, coloring books  Dental visits twice a year

## 2020-10-26 LAB — COMPLETE METABOLIC PANEL WITH GFR
AG Ratio: 1.8 (calc) (ref 1.0–2.5)
ALT: 14 U/L (ref 6–29)
AST: 21 U/L (ref 10–35)
Albumin: 3.7 g/dL (ref 3.6–5.1)
Alkaline phosphatase (APISO): 71 U/L (ref 37–153)
BUN/Creatinine Ratio: 18 (calc) (ref 6–22)
BUN: 17 mg/dL (ref 7–25)
CO2: 28 mmol/L (ref 20–32)
Calcium: 9.5 mg/dL (ref 8.6–10.4)
Chloride: 100 mmol/L (ref 98–110)
Creat: 0.96 mg/dL — ABNORMAL HIGH (ref 0.60–0.93)
GFR, Est African American: 67 mL/min/{1.73_m2} (ref >=60)
GFR, Est Non African American: 57 mL/min/{1.73_m2} — ABNORMAL LOW (ref >=60)
Globulin: 2.1 g/dL (calc) (ref 1.9–3.7)
Glucose, Bld: 77 mg/dL (ref 65–99)
Potassium: 5 mmol/L (ref 3.5–5.3)
Sodium: 134 mmol/L — ABNORMAL LOW (ref 135–146)
Total Bilirubin: 0.6 mg/dL (ref 0.2–1.2)
Total Protein: 5.8 g/dL — ABNORMAL LOW (ref 6.1–8.1)

## 2020-10-26 LAB — CBC WITH DIFFERENTIAL/PLATELET
Absolute Monocytes: 420 cells/uL (ref 200–950)
Basophils Absolute: 60 cells/uL (ref 0–200)
Basophils Relative: 1.2 %
Eosinophils Absolute: 200 cells/uL (ref 15–500)
Eosinophils Relative: 4 %
HCT: 40.6 % (ref 35.0–45.0)
Hemoglobin: 13.5 g/dL (ref 11.7–15.5)
Lymphs Abs: 960 cells/uL (ref 850–3900)
MCH: 29.5 pg (ref 27.0–33.0)
MCHC: 33.3 g/dL (ref 32.0–36.0)
MCV: 88.6 fL (ref 80.0–100.0)
MPV: 10.9 fL (ref 7.5–12.5)
Monocytes Relative: 8.4 %
Neutro Abs: 3360 cells/uL (ref 1500–7800)
Neutrophils Relative %: 67.2 %
Platelets: 273 10*3/uL (ref 140–400)
RBC: 4.58 10*6/uL (ref 3.80–5.10)
RDW: 11.8 % (ref 11.0–15.0)
Total Lymphocyte: 19.2 %
WBC: 5 10*3/uL (ref 3.8–10.8)

## 2020-10-26 NOTE — Progress Notes (Signed)
CBC is normal, GFR is low and stable.  Please forward labs to her PCP.

## 2020-10-29 DIAGNOSIS — L821 Other seborrheic keratosis: Secondary | ICD-10-CM | POA: Diagnosis not present

## 2020-10-29 DIAGNOSIS — L814 Other melanin hyperpigmentation: Secondary | ICD-10-CM | POA: Diagnosis not present

## 2020-10-29 DIAGNOSIS — D225 Melanocytic nevi of trunk: Secondary | ICD-10-CM | POA: Diagnosis not present

## 2020-10-29 DIAGNOSIS — L578 Other skin changes due to chronic exposure to nonionizing radiation: Secondary | ICD-10-CM | POA: Diagnosis not present

## 2020-10-29 DIAGNOSIS — L700 Acne vulgaris: Secondary | ICD-10-CM | POA: Diagnosis not present

## 2020-10-29 DIAGNOSIS — Z85828 Personal history of other malignant neoplasm of skin: Secondary | ICD-10-CM | POA: Diagnosis not present

## 2020-11-06 ENCOUNTER — Other Ambulatory Visit: Payer: Self-pay

## 2020-11-06 ENCOUNTER — Ambulatory Visit (INDEPENDENT_AMBULATORY_CARE_PROVIDER_SITE_OTHER): Payer: Medicare Other | Admitting: Rheumatology

## 2020-11-06 ENCOUNTER — Ambulatory Visit: Payer: Self-pay

## 2020-11-06 DIAGNOSIS — M0579 Rheumatoid arthritis with rheumatoid factor of multiple sites without organ or systems involvement: Secondary | ICD-10-CM | POA: Diagnosis not present

## 2020-11-06 DIAGNOSIS — M19042 Primary osteoarthritis, left hand: Secondary | ICD-10-CM

## 2020-11-06 DIAGNOSIS — M5412 Radiculopathy, cervical region: Secondary | ICD-10-CM

## 2020-11-06 DIAGNOSIS — N3000 Acute cystitis without hematuria: Secondary | ICD-10-CM | POA: Diagnosis not present

## 2020-11-06 DIAGNOSIS — M79642 Pain in left hand: Secondary | ICD-10-CM

## 2020-11-06 DIAGNOSIS — M79641 Pain in right hand: Secondary | ICD-10-CM

## 2020-11-06 DIAGNOSIS — Z79899 Other long term (current) drug therapy: Secondary | ICD-10-CM

## 2020-11-06 DIAGNOSIS — M19041 Primary osteoarthritis, right hand: Secondary | ICD-10-CM

## 2020-11-06 MED ORDER — TRAMADOL HCL 50 MG PO TABS: 50.0000 mg | ORAL_TABLET | Freq: Three times a day (TID) | ORAL | 0 refills | Status: DC | PRN

## 2020-11-06 NOTE — Progress Notes (Signed)
  Patient came in today to get ultrasound examination of bilateral hands to rule out synovitis.  The ultrasound examination did not show any synovitis or tenosynovitis.  Bilateral median nerves are within normal limits.  I believe most of her symptoms are coming from osteoarthritis.  The findings were discussed with the patient and her husband.  Patient was referred to physical therapy and Occupational Therapy. Bo Merino, MD

## 2020-11-07 DIAGNOSIS — R531 Weakness: Secondary | ICD-10-CM | POA: Diagnosis not present

## 2020-11-20 DIAGNOSIS — M25632 Stiffness of left wrist, not elsewhere classified: Secondary | ICD-10-CM | POA: Diagnosis not present

## 2020-11-20 DIAGNOSIS — M19041 Primary osteoarthritis, right hand: Secondary | ICD-10-CM | POA: Diagnosis not present

## 2020-11-20 DIAGNOSIS — M19042 Primary osteoarthritis, left hand: Secondary | ICD-10-CM | POA: Diagnosis not present

## 2020-11-20 DIAGNOSIS — M25631 Stiffness of right wrist, not elsewhere classified: Secondary | ICD-10-CM | POA: Diagnosis not present

## 2020-11-20 DIAGNOSIS — R29898 Other symptoms and signs involving the musculoskeletal system: Secondary | ICD-10-CM | POA: Diagnosis not present

## 2020-11-22 ENCOUNTER — Encounter: Payer: Self-pay | Admitting: Internal Medicine

## 2020-11-22 ENCOUNTER — Ambulatory Visit (INDEPENDENT_AMBULATORY_CARE_PROVIDER_SITE_OTHER): Payer: Medicare Other | Admitting: Internal Medicine

## 2020-11-22 VITALS — BP 119/63 | HR 61 | Temp 97.6°F | Ht 66.0 in | Wt 140.2 lb

## 2020-11-22 DIAGNOSIS — Z0000 Encounter for general adult medical examination without abnormal findings: Secondary | ICD-10-CM | POA: Diagnosis not present

## 2020-11-22 DIAGNOSIS — M818 Other osteoporosis without current pathological fracture: Secondary | ICD-10-CM | POA: Diagnosis not present

## 2020-11-22 DIAGNOSIS — Z87440 Personal history of urinary (tract) infections: Secondary | ICD-10-CM | POA: Diagnosis not present

## 2020-11-22 DIAGNOSIS — Z8619 Personal history of other infectious and parasitic diseases: Secondary | ICD-10-CM

## 2020-11-22 DIAGNOSIS — R0789 Other chest pain: Secondary | ICD-10-CM

## 2020-11-22 DIAGNOSIS — Z85828 Personal history of other malignant neoplasm of skin: Secondary | ICD-10-CM | POA: Insufficient documentation

## 2020-11-22 DIAGNOSIS — F325 Major depressive disorder, single episode, in full remission: Secondary | ICD-10-CM | POA: Diagnosis not present

## 2020-11-22 DIAGNOSIS — N3000 Acute cystitis without hematuria: Secondary | ICD-10-CM

## 2020-11-22 HISTORY — DX: Personal history of urinary (tract) infections: Z87.440

## 2020-11-22 LAB — POCT URINALYSIS DIPSTICK
Bilirubin, UA: NEGATIVE
Glucose, UA: NEGATIVE
Ketones, UA: NEGATIVE
Leukocytes, UA: NEGATIVE
Nitrite, UA: NEGATIVE
Protein, UA: NEGATIVE
Spec Grav, UA: 1.025 (ref 1.010–1.025)
Urobilinogen, UA: 0.2 E.U./dL
pH, UA: 7 (ref 5.0–8.0)

## 2020-11-22 NOTE — Progress Notes (Signed)
75 year old Kristen Jacobs is establishing primary care here at [facility] where most of her specialists are located.  She lives in [facility] and has been cared for by Dr. Greig Right at [facility] family medicine in [place].  Kristen Jacobs has no acute concerns today and needs no refills.  Her active medical problems are joint related, having been diagnosed in the past year with rheumatoid arthritis, initially managed at integrative medicine through [place] and currently being treated by Dr. Estanislado Pandy in rheumatology.  She has osteoarthritis and other musculoskeletal issues which are managed by Dr. Dianah Field in [place].    Her meds were reviewed and are correct as noted in the EMR. She prefers 3 month supplies of maintenance medicines through her mail order pharmacy.  Hand and wrist pain and weakness and generalized fatigue have been functionally significant symptoms.  She feels that she has improved with hydroxychloroquine, noting functionally that she is able to grasp and don elastic leggings which she had been unable to do.  She is undergoing occupational therapy for upper extremity work.  She describes having difficulty getting up from floor level and is unable to do many yoga poses which she had greatly enjoyed in the past.  She is currently doing Pilates 2 to 3 days a week and walks her dog on occasion for exercise.  She misses her former energetic and physically capable self.  Report of symptoms invited: Two episodes of chest tightness, sensation of band constricting, no associated sxs such as chest pain, nausea, palpitations, or abnormal heartbeat. Onset at rest (most recently two weeks ago while lying awake in bed) and the first about 6 wks ago.  < 10 seconds duration, feels perfectly normal both before and after.  No exertional chest symptoms.  No direct relatives with heart disease.  Cousin age 50 heart attack.    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 11/22/2020   History of UTI 11/22/2020   Major depression in remission ([place]) 11/22/2020   History of hepatitis C 04/05/2020   Cervical spondylosis with radiculopathy 02/21/2020   Rheumatoid factor positive 02/17/2020   Laryngopharyngeal reflux (LPR) 12/20/2019   Avulsion fracture of left talus 09/05/2019   Primary osteoarthritis of left knee 07/19/2019   Subacromial bursitis of left shoulder joint 07/19/2019   Bilateral wrist pain, 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 05/24/2019   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   Radiculitis of right cervical region 11/12/2017   Chronic right shoulder pain 11/12/2017   Pain of right sacroiliac joint 09/10/2017   Hip flexor tendinitis, right 08/11/2017    Current Outpatient Medications:    Cholecalciferol (VITAMIN D3 PO), Take 5,000 Units by mouth daily., Disp: , Rfl:    citalopram (CELEXA) 20 MG tablet, , Disp: , Rfl:    hydroxychloroquine (PLAQUENIL) 200 MG tablet, TAKE 1 TABLET BY MOUTH TWICE DAILY MONDAY  THROUGH  FRIDAY  ONLY  NONE  ONSATURDAY  OR  SUNDAY, Disp: 120 tablet, Rfl: 0   Multiple Vitamins-Minerals (WOMENS BONE HEALTH PO), Take by mouth., Disp: , Rfl:    OVER THE COUNTER MEDICATION, at bedtime. Triphala (anti inflammatory), Disp: , Rfl:    traMADol (ULTRAM) 50 MG tablet, Take 1 tablet (50 mg total) by mouth every 8 (eight) hours as needed for moderate pain., Disp: 30 tablet, Rfl: 0   traZODone (DESYREL) 100 MG tablet, Take 100 mg by mouth at bedtime.,  Disp: , Rfl:    Family History  Problem Relation Age of Onset   Dementia Mother    Cancer Father    Bipolar disorder Sister    Leukemia Brother    Healthy Son    Asthma Son    Healthy Son    Social History   Social History Narrative   From [place], first Scientist, research (life sciences), family is [place]  Moved to [facility] in late 2010's.  Enjoys healthful living,  Mediterranean diet, staying active.  First husband passed away due to complications from diabetes.  She has since remarried.  Tobacco use age [age], up to 3 ppd. Quit drinking ETOH beverages age 89.    BP 119/63 (BP Location: Right Arm, Patient Position: Sitting, Cuff Size: Small)   Pulse 61   Temp 97.6 F (36.4 C) (Oral)   Ht 5\' 6"  (1.676 m)   Wt 140 lb 3.2 oz (63.6 kg)   SpO2 100%   BMI 22.63 kg/m   Slender habitus, youthful appearance.  Heart RRR no murmur or extra heart sound over each valve.  No JVD, no LE edema.  Radial pulses 2+.  Hands and wrists without evidence of current inflammation.  Fluent in conversation, movement is smooth, quick, and balanced.    Assessment and plan: Ms. Macho has well-controlled chronic medical conditions as noted in her problem list, currently managed by specialist in the [facility] health system.  No refills are needed today and no diagnostics are indicated.  We will continue getting acquainted at future visits at which time we will revisit her chest symptoms, goals of care, and preventative measures.  See problem based charting.    Marland Kitchen

## 2020-11-22 NOTE — Patient Instructions (Signed)
Ms. Nuss, I enjoyed meeting you today!  We'll continue our conversation next time.   Take care and stay well! Dr. Jimmye Norman

## 2020-11-26 DIAGNOSIS — M25631 Stiffness of right wrist, not elsewhere classified: Secondary | ICD-10-CM | POA: Diagnosis not present

## 2020-11-26 DIAGNOSIS — M25632 Stiffness of left wrist, not elsewhere classified: Secondary | ICD-10-CM | POA: Diagnosis not present

## 2020-11-26 DIAGNOSIS — R29898 Other symptoms and signs involving the musculoskeletal system: Secondary | ICD-10-CM | POA: Diagnosis not present

## 2021-01-04 DIAGNOSIS — H02403 Unspecified ptosis of bilateral eyelids: Secondary | ICD-10-CM | POA: Diagnosis not present

## 2021-01-23 ENCOUNTER — Other Ambulatory Visit: Payer: Self-pay | Admitting: Physician Assistant

## 2021-01-23 NOTE — Telephone Encounter (Signed)
Next Visit: 03/26/2021  Last Visit: 10/25/2020  Labs: 10/25/2020 CBC is normal, GFR is low and stable  Eye exam: WNL 08/14/2020   Current Dose per office note 10/25/2020: Plaquenil 200 mg p.o. twice daily Monday to Friday   [id number] arthritis involving multiple sites with positive rheumatoid factor  Last Fill: 10/02/2020  Okay to refill Plaquenil?

## 2021-01-24 ENCOUNTER — Ambulatory Visit (INDEPENDENT_AMBULATORY_CARE_PROVIDER_SITE_OTHER): Payer: Medicare Other | Admitting: Internal Medicine

## 2021-01-24 VITALS — BP 107/62 | HR 55 | Temp 98.0°F | Ht 66.0 in | Wt 139.3 lb

## 2021-01-24 DIAGNOSIS — F40298 Other specified phobia: Secondary | ICD-10-CM

## 2021-01-24 DIAGNOSIS — R5383 Other fatigue: Secondary | ICD-10-CM | POA: Diagnosis not present

## 2021-01-24 DIAGNOSIS — Z8619 Personal history of other infectious and parasitic diseases: Secondary | ICD-10-CM | POA: Diagnosis not present

## 2021-01-24 DIAGNOSIS — M818 Other osteoporosis without current pathological fracture: Secondary | ICD-10-CM | POA: Diagnosis not present

## 2021-01-24 DIAGNOSIS — M069 Rheumatoid arthritis, unspecified: Secondary | ICD-10-CM | POA: Diagnosis not present

## 2021-01-24 DIAGNOSIS — Z1231 Encounter for screening mammogram for malignant neoplasm of breast: Secondary | ICD-10-CM

## 2021-01-24 NOTE — Progress Notes (Signed)
Kristen Jacobs (osteoarthritis; rheumatoid arthritis being treated with hydroxychloroquine by rheumatologist) is here for routine follow-up.  Her movement and dance programs have been wonderful and have greatly improved her chronic pain.  She is taking Pilates twice a week though has not found a classical yoga program yet.  The chest pain that she had described at last visit has not recurred, though she does have a discomfort in her left left breast which is noticeable when hugging.  She has not palpated a lesion.  She happens to be due for a mammogram and would like to complete it in the [facility] system.  We discussed additional healthcare maintenance and preventative interventions.  She recalls having a DEXA scan and does carry a diagnosis of osteoporosis.  Takes vitamin D supplement.  Recalls kyphosis of her maternal grandmother.  Has declined bisphosphonates due to personal connection with an individual who suffered from osteonecrosis of the jaw.  She believes the last colonoscopy was within 10 years; initiated at age 29.  No family history of colon cancer, is willing to update colonoscopy screening if indicated.  Generally not in favor of vaccines.  Received the Zostavax 8 years ago when witnessing the discomfort her brother experienced with the shingles outbreak.  Felt that she was somewhat pushed into receiving the COVID vaccines and does not wish to receive boosters which are not aligned with her faith.  Has not received her pneumococcal vaccine and does not wish to.  She did receive a tetanus vaccine in the past after an event which may have placed her at risk, but does not wish to have a booster.  Kristen Jacobs sees an Ayurvedic practitioner.      BP 107/62 (BP Location: Right Arm, Patient Position: Sitting, Cuff Size: Small)   Pulse (!) 55   Temp 98 F (36.7 C) (Oral)   Ht 5\' 6"  (1.676 m)   Wt 139 lb 4.8 oz (63.2 kg)   SpO2 100%   BMI 22.48 kg/m  Slender habitus and and youthful  appearance. Stable gait and smooth sit/stand without antalgia.  Assessment and plan: Mammography for breast cancer screening will be ordered.  We discussed whether obtaining a follow-up DEXA scan would have merit, and due to her desire to avoid bisphosphonates if indicated, any result would not change our treatment plan.  We will not pursue this.  No plan for vaccinations per her preference.  Check vitamin D level.  She would also like to follow-up on her hepatitis C's status which was diagnosed incidentally while asymptomatic. Treated and cured 2015. TAkes ashwagandha and inquires whether it is compatible with hydroxychloroquine. She has a f/u appt with rheumatologist in 03/2021.

## 2021-01-25 LAB — CMP14 + ANION GAP
ALT: 12 IU/L (ref 0–32)
AST: 21 IU/L (ref 0–40)
Albumin/Globulin Ratio: 1.8 (ref 1.2–2.2)
Albumin: 4.2 g/dL (ref 3.7–4.7)
Alkaline Phosphatase: 87 IU/L (ref 44–121)
Anion Gap: 18 mmol/L (ref 10.0–18.0)
BUN/Creatinine Ratio: 21 (ref 12–28)
BUN: 18 mg/dL (ref 8–27)
Bilirubin Total: 0.4 mg/dL (ref 0.0–1.2)
CO2: 21 mmol/L (ref 20–29)
Calcium: 10.1 mg/dL (ref 8.7–10.3)
Chloride: 106 mmol/L (ref 96–106)
Creatinine, Ser: 0.87 mg/dL (ref 0.57–1.00)
Globulin, Total: 2.3 g/dL (ref 1.5–4.5)
Glucose: 86 mg/dL (ref 65–99)
Potassium: 5.6 mmol/L — ABNORMAL HIGH (ref 3.5–5.2)
Sodium: 145 mmol/L — ABNORMAL HIGH (ref 134–144)
Total Protein: 6.5 g/dL (ref 6.0–8.5)
eGFR: 69 mL/min/{1.73_m2} (ref >59)

## 2021-01-25 LAB — VITAMIN D 25 HYDROXY (VIT D DEFICIENCY, FRACTURES): Vit D, 25-Hydroxy: 68.2 ng/mL (ref 30.0–100.0)

## 2021-01-25 LAB — HEPATITIS C ANTIBODY: Hep C Virus Ab: >11 s/co ratio — ABNORMAL HIGH (ref 0.0–0.9)

## 2021-02-22 ENCOUNTER — Encounter: Payer: Self-pay | Admitting: Internal Medicine

## 2021-02-22 DIAGNOSIS — F40298 Other specified phobia: Secondary | ICD-10-CM | POA: Insufficient documentation

## 2021-02-22 NOTE — Assessment & Plan Note (Signed)
Discussed DEXA f/u.  We agreed not to pursue this study as results would not influence treatment given desire to avoid bisphosphonates.

## 2021-03-04 ENCOUNTER — Telehealth: Payer: Self-pay | Admitting: Internal Medicine

## 2021-03-04 DIAGNOSIS — Z8619 Personal history of other infectious and parasitic diseases: Secondary | ICD-10-CM

## 2021-03-04 NOTE — Telephone Encounter (Signed)
Phoned Kristen Jacobs to review her labs.  She would like the peace of mind from a test showing sustained cure of Hep C, treated in the past.  Will place order which she can complete at her convenience.

## 2021-03-12 NOTE — Progress Notes (Signed)
Office Visit Note  Patient: Kristen Jacobs             Date of Birth: [birth year]           MRN: [id number]             PCP: Angelica Pou, MD Referring: Greig Right, MD Visit Date: 03/26/2021 Occupation: @GUAROCC @  Subjective:  Medication monitoring   History of Present Illness: Kristen Jacobs is a 75 y.o. female with history of seropositive rheumatoid arthritis, osteoarthritis, DDD.  Patient is taking Plaquenil 200 mg 1 tablet by mouth twice daily Monday through Friday.  She continues to tolerate Plaquenil without any side effects.  She denies any signs or symptoms of a rheumatoid arthritis flare.  She remains active exercising 5 days a week.  She states that she continues to have some neck pain and stiffness but denies any symptoms of radiculopathy.  She takes tramadol very sparingly for pain relief.  She has occasional discomfort in both wrist joints but denies any nocturnal pain or increased morning stiffness.  She has noticed some weakness in her knee joints but has started to increase her lower extremity strengthening and Pilates workout exercises.  Her energy level has been stable.  She has been sleeping well at night taking trazodone at bedtime as prescribed.    Activities of Daily Living:  Patient reports morning stiffness for 3 minutes.   Patient Denies nocturnal pain.  Difficulty dressing/grooming: Denies Difficulty climbing stairs: Denies Difficulty getting out of chair: Denies Difficulty using hands for taps, buttons, cutlery, and/or writing: Reports  Review of Systems  Constitutional:  Positive for fatigue.  HENT:  Positive for mouth dryness.   Eyes:  Negative for dryness.  Respiratory:  Negative for shortness of breath.   Cardiovascular:  Negative for swelling in legs/feet.  Gastrointestinal:  Positive for constipation.  Endocrine: Negative for increased urination.  Genitourinary:  Negative for difficulty urinating.  Musculoskeletal:  Positive for joint  pain, joint pain, muscle weakness, morning stiffness and muscle tenderness.  Skin:  Positive for rash.  Allergic/Immunologic: Negative for susceptible to infections.  Neurological:  Positive for weakness.  Hematological:  Negative for bruising/bleeding tendency.  Psychiatric/Behavioral:  Negative for sleep disturbance.    PMFS History:  Patient Active Problem List   Diagnosis Date Noted   Fear of vaccinations 02/22/2021   History of malignant neoplasm of skin 11/22/2020   Age-related osteoporosis without fracture 11/22/2020   Major depression in remission ([place]) 11/22/2020   Sensation of chest tightness 11/22/2020   History of hepatitis C 04/05/2020   Cervical spondylosis with radiculopathy 02/21/2020   Rheumatoid factor positive 02/17/2020   Laryngopharyngeal reflux (LPR) 12/20/2019   Avulsion fracture of left talus 09/05/2019   Primary osteoarthritis of left knee 07/19/2019   Subacromial bursitis of left shoulder joint 07/19/2019   Bilateral wrist pain, 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 05/24/2019   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   Radiculitis of right cervical region 11/12/2017   Chronic right shoulder pain 11/12/2017   Pain of right sacroiliac joint 09/10/2017   Hip flexor tendinitis, right 08/11/2017    Past Medical History:  Diagnosis Date   Constipation    Hepatitis    History of UTI 11/22/2020   Recent event, fatigue w/o dysuria, symptoms resolved with antibiotic (name not recalled).  Urine POC dipstick normal today.    Joint pain     Family History  Problem Relation Age of Onset   Dementia Mother    Cancer Father  Bipolar disorder Sister    Leukemia Brother    Healthy Son    Asthma Son    Healthy Son    Past Surgical History:  Procedure Laterality Date   BREAST BIOPSY Bilateral    benign   CATARACT EXTRACTION Right 03/2020   LAPAROSCOPY     LIVER BIOPSY     OOPHORECTOMY     right    TONSILLECTOMY     age 10    Social History   Social History  Narrative   From [place], first Scientist, research (life sciences), family is [place]  Moved to [facility] in late 2010's.  Enjoys healthful living, Mediterranean diet, staying active.  First husband passed away due to complications from diabetes.  She has since remarried.   Immunization History  Administered Date(s) Administered   [employer] Vaccination 11/17/2019, 12/05/2019, 01/02/2020     Objective: Vital Signs: BP 128/67 (BP Location: Left Arm, Patient Position: Sitting, Cuff Size: Small)   Pulse 60   Resp 12   Ht 5\' 6"  (1.676 m)   Wt 146 lb (66.2 kg)   BMI 23.57 kg/m    Physical Exam Vitals and nursing note reviewed.  Constitutional:      Appearance: She is well-developed.  HENT:     Head: Normocephalic and atraumatic.  Eyes:     Conjunctiva/sclera: Conjunctivae normal.  Pulmonary:     Effort: Pulmonary effort is normal.  Abdominal:     Palpations: Abdomen is soft.  Musculoskeletal:     Cervical back: Normal range of motion.  Skin:    General: Skin is warm and dry.     Capillary Refill: Capillary refill takes less than 2 seconds.  Neurological:     Mental Status: She is alert and oriented to person, place, and time.  Psychiatric:        Behavior: Behavior normal.     Musculoskeletal Exam: C-spine has good range of motion with some discomfort with lateral rotation.  Shoulder joints, elbow joints, wrist joints, MCPs, PIPs, DIPs have good range of motion with no synovitis.  PIP and DIP thickening consistent with osteoarthritis of both hands.  Some tenderness on the ulnar aspect of the left wrist noted.  Hip joints have good range of motion with no groin pain.  Mild tenderness over bilateral trochanteric bursa especially the left side.  Knee joints have good range of motion with no warmth or effusion.  Ankle joints have good range of motion with no tenderness or joint swelling.  PIP and DIP prominence consistent with osteoarthritis of both feet.  Bunions noted  bilaterally.  CDAI Exam: CDAI Score: 0.2  Patient Global: 1 mm; Provider Global: 1 mm Swollen: 0 ; Tender: 0  Joint Exam 03/26/2021   No joint exam has been documented for this visit   There is currently no information documented on the homunculus. Go to the Rheumatology activity and complete the homunculus joint exam.  Investigation: No additional findings.  Imaging: MM 3D SCREEN BREAST BILATERAL  Result Date: 03/18/2021 CLINICAL DATA:  Screening. EXAM: DIGITAL SCREENING BILATERAL MAMMOGRAM WITH TOMOSYNTHESIS AND CAD TECHNIQUE: Bilateral screening digital craniocaudal and mediolateral oblique mammograms were obtained. Bilateral screening digital breast tomosynthesis was performed. The images were evaluated with computer-aided detection. COMPARISON:  Previous exam(s). ACR Breast Density Category b: There are scattered areas of fibroglandular density. FINDINGS: There are no findings suspicious for malignancy. IMPRESSION: No mammographic evidence of malignancy. A result letter of this screening mammogram will be mailed directly to the patient. RECOMMENDATION:  Screening mammogram in one year. (Code:SM-B-01Y) BI-RADS CATEGORY  1: Negative. Electronically Signed   By: Abelardo Diesel M.D.   On: 03/18/2021 07:27    Recent Labs: Lab Results  Component Value Date   WBC 5.0 10/25/2020   HGB 13.5 10/25/2020   PLT 273 10/25/2020   NA 145 (H) 01/24/2021   K 5.6 (H) 01/24/2021   CL 106 01/24/2021   CO2 21 01/24/2021   GLUCOSE 86 01/24/2021   BUN 18 01/24/2021   CREATININE 0.87 01/24/2021   BILITOT 0.4 01/24/2021   ALKPHOS 87 01/24/2021   AST 21 01/24/2021   ALT 12 01/24/2021   PROT 6.5 01/24/2021   ALBUMIN 4.2 01/24/2021   CALCIUM 10.1 01/24/2021   GFRAA 67 10/25/2020    Speciality Comments: PLQ eye exam WNL 08/14/2020 [facility] f/u 12 months  Procedures:  No procedures performed Allergies: Other and Sulfa antibiotics   Assessment / Plan:     Visit Diagnoses:  Rheumatoid arthritis involving multiple sites with positive rheumatoid factor (HCC) - MRI of the wrist joint showed synovitis, tenosynovitis and possible erosions: She has no synovitis on examination today.  She had an ultrasound of both hands on 11/06/2020 which was negative for synovitis in her hands and wrist joints.  Overall she has clinically been doing well taking Plaquenil 200 mg 1 tablet by mouth twice daily Monday through Friday.  She continues to tolerate Plaquenil without any side effects.  She has been exercising 5 days a week.  She continues to experience intermittent discomfort in both wrist joints and has some tenderness on the ulnar aspect of the left wrist.  Discussed the importance of joint protection and muscle strengthening.  She will remain on Plaquenil as prescribed.  She was advised to notify us if she develops increased joint pain or joint swelling.  She will follow-up in the office in 5 months.  High risk medication use - Plaquenil 200 mg 1 tablet by mouth twice daily Monday to Friday start date March 08, 2020. CMP updated on 01/24/2021.  CBC drawn on 10/25/2020.  Results were reviewed with the patient today in the office.  History of elevated creatinine.  She has been trying to avoid the use of NSAIDs.  She is to update lab work today.  Orders for CBC and CMP were released.  We will continue to monitor lab work every 5 months.  PLQ eye exam WNL 08/14/2020 [facility] f/u 12 months.   - Plan: CBC with Differential/Platelet, COMPLETE METABOLIC PANEL WITH GFR  Elevated serum creatinine - Creatinine was within normal limits: 0.87 and GFR was 69 on 01/24/2021.  CMP with GFR will be updated today.  Discussed the importance of avoiding NSAID use on a daily basis.  We will continue to monitor lab work closely.  Plan: COMPLETE METABOLIC PANEL WITH GFR  Pain in both hands - X-ray of bilateral hands showed cystic versus erosive changes in the carpals.  She has PIP and DIP thickening  consistent with osteoarthritis of both hands.  Some tenderness on the ulnar aspect of the left wrist.  No synovitis was noted.  She was able to make a complete fist bilaterally.  Discussed the importance of joint protection and muscle strengthening.  She was given a handout of hand exercises to perform.  Primary osteoarthritis of left knee: She has good range of motion of the left knee joint on examination today.  No warmth or effusion was noted.  She has been exercising 5 days/week.  Discussed  the importance of lower extremity muscle strengthening.  Primary osteoarthritis of both feet: She has PIP and DIP thickening consistent with osteoarthritis of both feet.  Bunions noted bilaterally.  Ankle joints have good range of motion with no tenderness or inflammation.  Discussed the importance of wearing proper fitting shoes.  DDD (degenerative disc disease), cervical - Patient is followed by Dr. Arnoldo Morale and has attended physical therapy.  She has good range of motion with some discomfort with lateral rotation.  No symptoms of radiculopathy at this time.  Other medical conditions are listed as follows:   Closed nondisplaced avulsion fracture of left talus with routine healing, subsequent encounter  Anxiety - On Celexa.  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  History of hepatitis C - Treated with Harvoni.  Orders: Orders Placed This Encounter  Procedures   CBC with Differential/Platelet   COMPLETE METABOLIC PANEL WITH GFR   No orders of the defined types were placed in this encounter.    Follow-Up Instructions: Return in about 5 months (around 08/24/2021) for Rheumatoid arthritis, Osteoarthritis.   Ofilia Neas, PA-C  Note - This record has been created using Dragon software.  Chart creation errors have been sought, but may not always  have been located. Such creation errors do not reflect on  the standard of medical care.

## 2021-03-15 ENCOUNTER — Ambulatory Visit
Admission: RE | Admit: 2021-03-15 | Discharge: 2021-03-15 | Disposition: A | Discharge status: Home / Self Care | Payer: Medicare Other | Source: Ambulatory Visit | Attending: Internal Medicine | Admitting: Internal Medicine

## 2021-03-15 DIAGNOSIS — Z1231 Encounter for screening mammogram for malignant neoplasm of breast: Secondary | ICD-10-CM | POA: Diagnosis not present

## 2021-03-15 IMAGING — MG MM DIGITAL SCREENING BILAT W/ TOMO AND CAD
8 series · 9 of 24 positions shown · non-contrast
Comparison: Previous exam(s).

CLINICAL DATA: Screening.

EXAM:
DIGITAL SCREENING BILATERAL MAMMOGRAM WITH TOMOSYNTHESIS AND CAD
TECHNIQUE: Bilateral screening digital craniocaudal and mediolateral oblique
mammograms were obtained. Bilateral screening digital breast
tomosynthesis was performed. The images were evaluated with
computer-aided detection.

[R CC synth-2D]
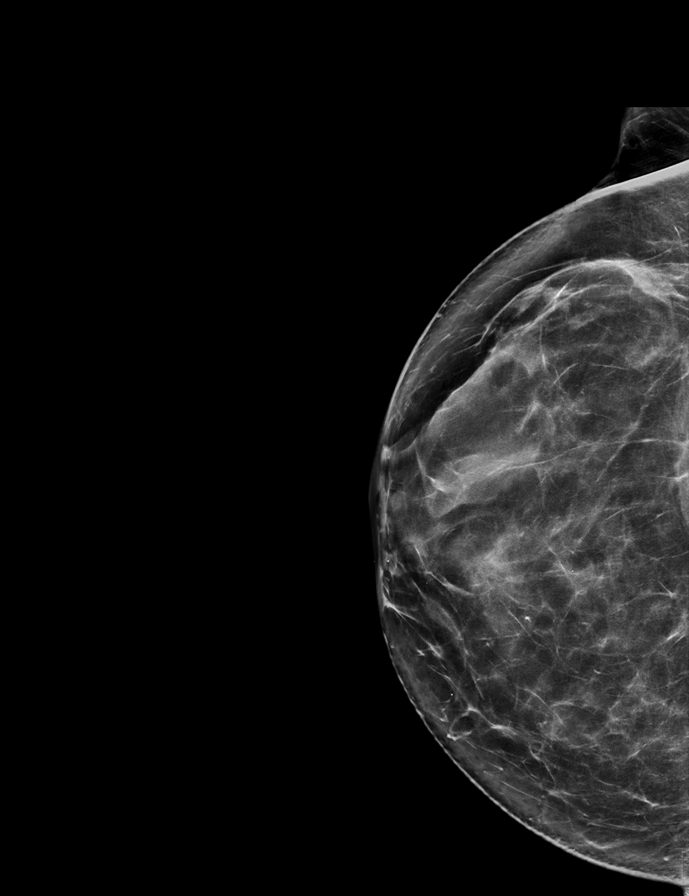

[R MLO synth-2D]
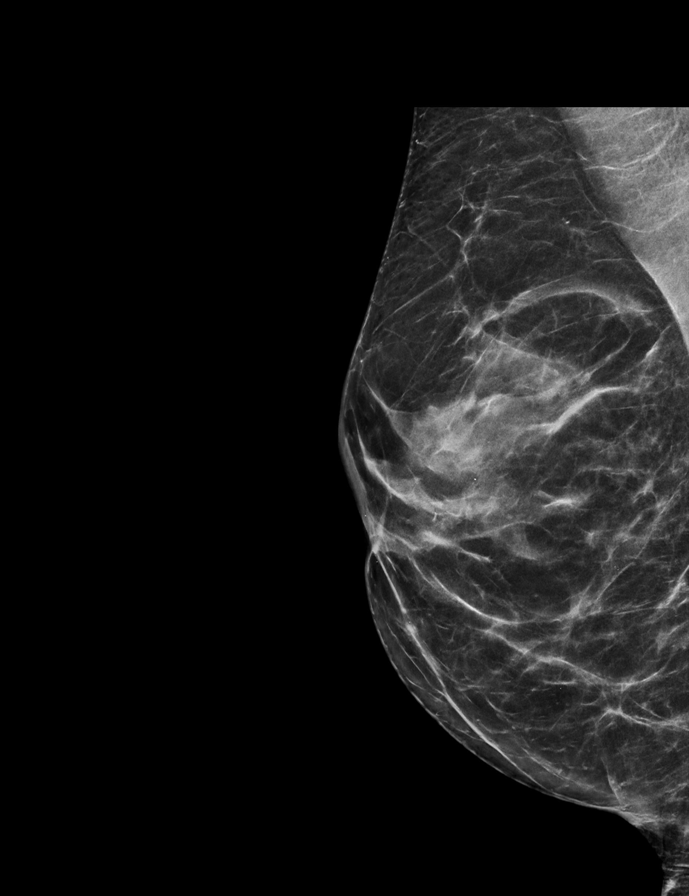

[L MLO synth-2D]
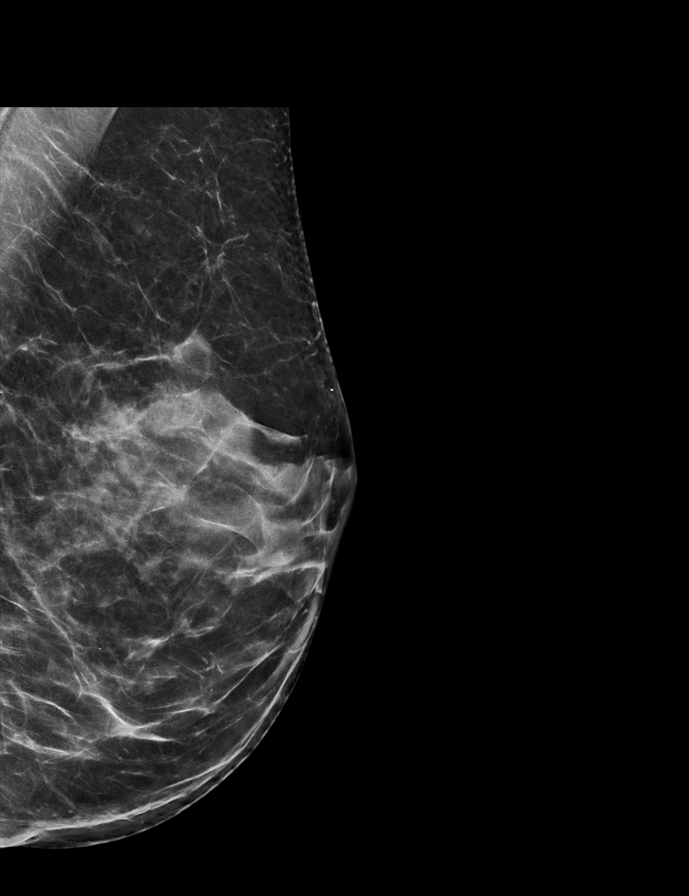

[L CC synth-2D]
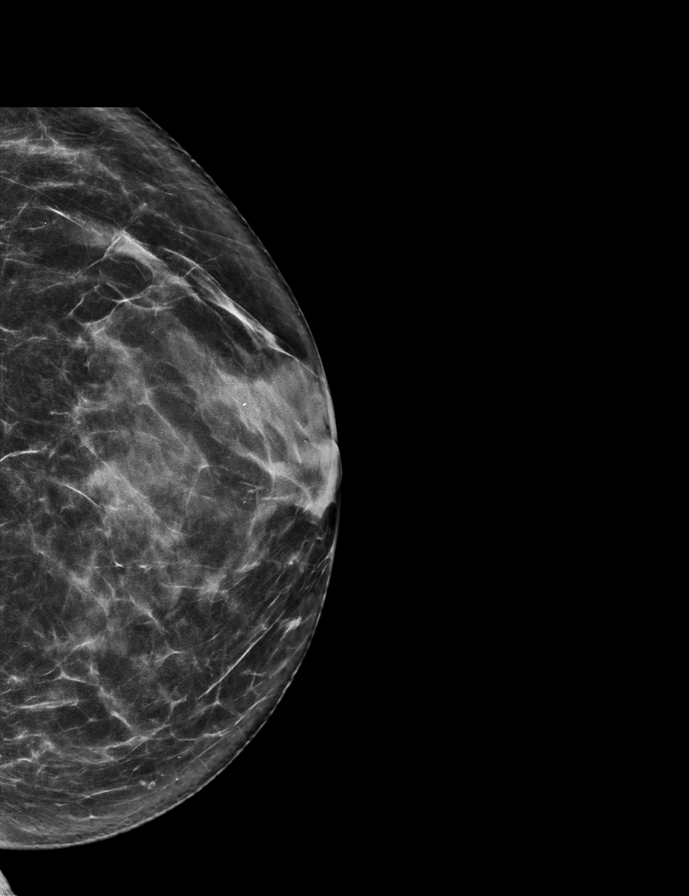

[R MLO tomo · 2 of 64 frames shown]
[frame 21/64]
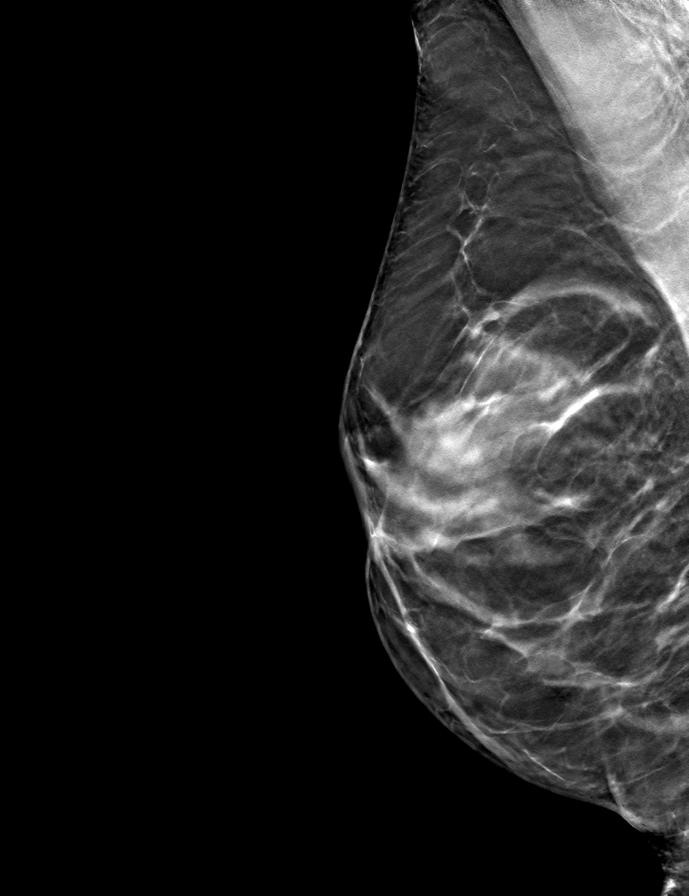
[frame 33/64]
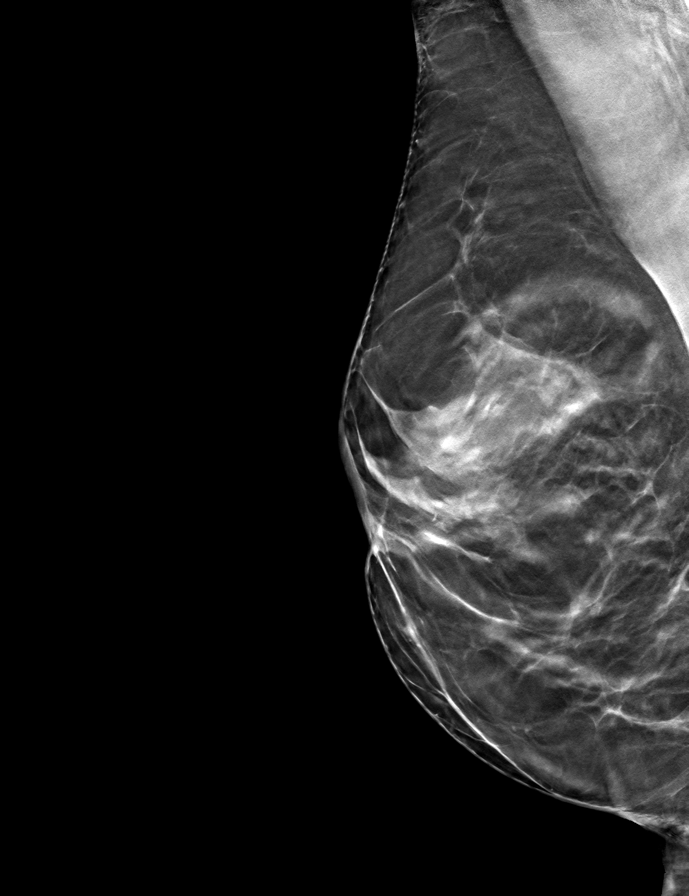

[L MLO tomo · tomo slice 33/66.0]
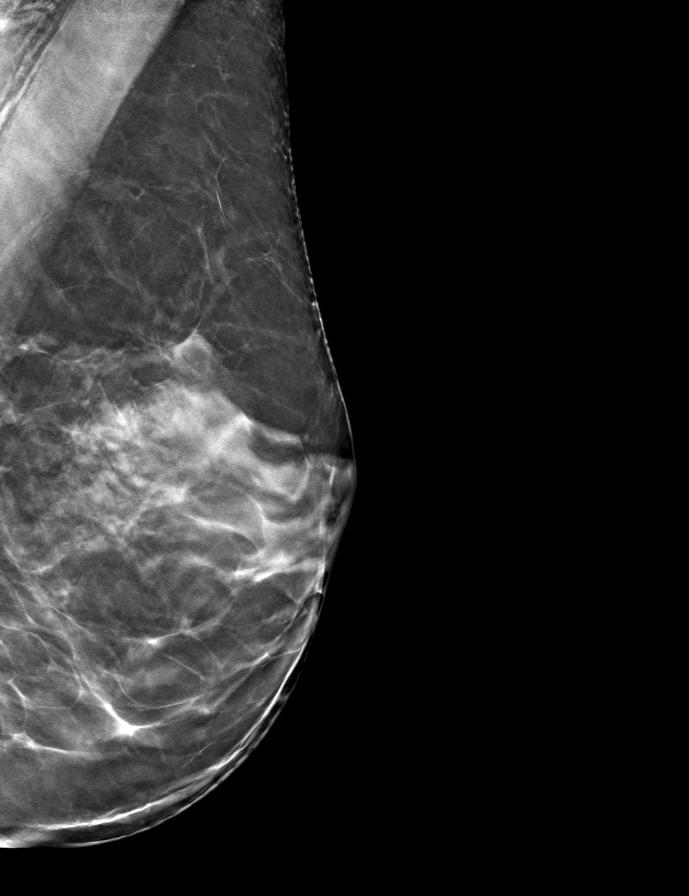

[R CC tomo · tomo slice 39/76.0]
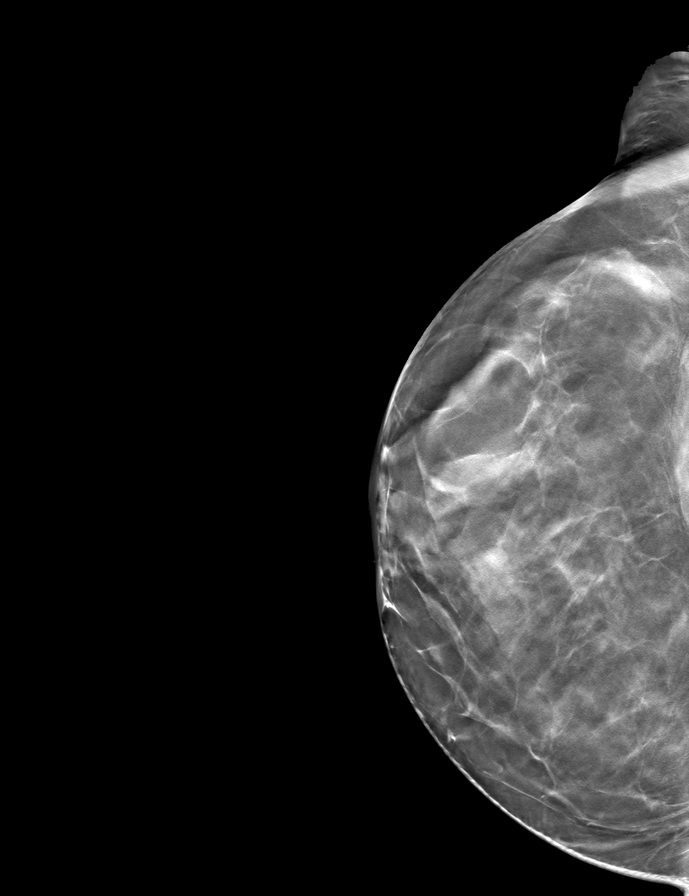

[L CC tomo · tomo slice 35/69.0]
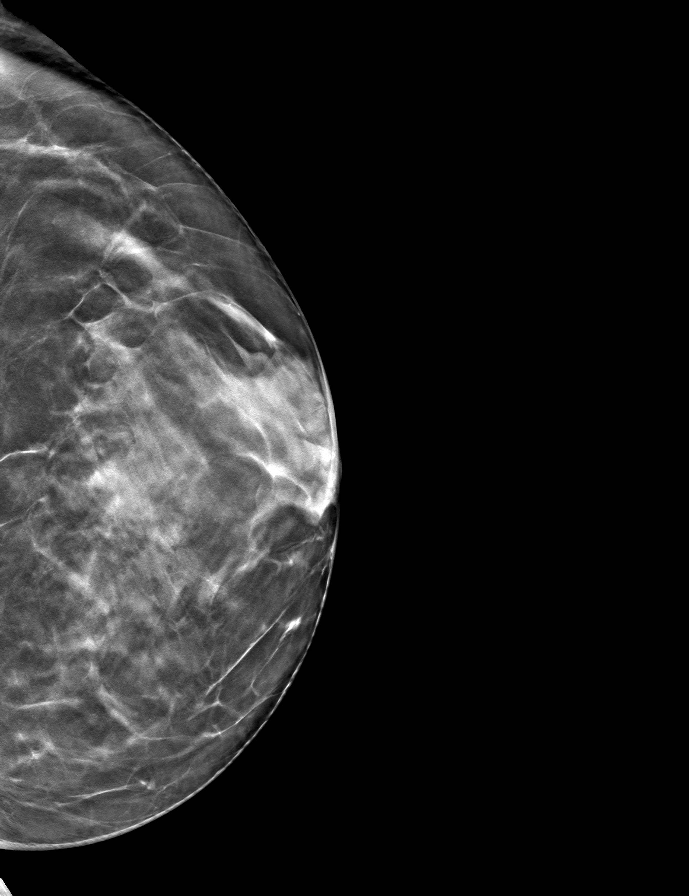

[9 of 24 positions shown; findings below may reference images not displayed]

ACR Breast Density Category b: There are scattered areas of
fibroglandular density.
FINDINGS: There are no findings suspicious for malignancy.
IMPRESSION: No mammographic evidence of malignancy. A result letter of this
screening mammogram will be mailed directly to the patient.

RECOMMENDATION:
Screening mammogram in one year. (Code:[BY])

BI-RADS CATEGORY  1: Negative.

## 2021-03-26 ENCOUNTER — Ambulatory Visit (INDEPENDENT_AMBULATORY_CARE_PROVIDER_SITE_OTHER): Payer: Medicare Other | Admitting: Physician Assistant

## 2021-03-26 ENCOUNTER — Other Ambulatory Visit: Payer: Self-pay

## 2021-03-26 ENCOUNTER — Encounter: Payer: Self-pay | Admitting: Physician Assistant

## 2021-03-26 VITALS — BP 128/67 | HR 60 | Resp 12 | Ht 66.0 in | Wt 146.0 lb

## 2021-03-26 DIAGNOSIS — M79641 Pain in right hand: Secondary | ICD-10-CM | POA: Diagnosis not present

## 2021-03-26 DIAGNOSIS — S92155D Nondisplaced avulsion fracture (chip fracture) of left talus, subsequent encounter for fracture with routine healing: Secondary | ICD-10-CM | POA: Diagnosis not present

## 2021-03-26 DIAGNOSIS — M79671 Pain in right foot: Secondary | ICD-10-CM

## 2021-03-26 DIAGNOSIS — M19071 Primary osteoarthritis, right ankle and foot: Secondary | ICD-10-CM

## 2021-03-26 DIAGNOSIS — M1712 Unilateral primary osteoarthritis, left knee: Secondary | ICD-10-CM

## 2021-03-26 DIAGNOSIS — Z79899 Other long term (current) drug therapy: Secondary | ICD-10-CM

## 2021-03-26 DIAGNOSIS — M0579 Rheumatoid arthritis with rheumatoid factor of multiple sites without organ or systems involvement: Secondary | ICD-10-CM | POA: Diagnosis not present

## 2021-03-26 DIAGNOSIS — M79642 Pain in left hand: Secondary | ICD-10-CM

## 2021-03-26 DIAGNOSIS — M5030 Other cervical disc degeneration, unspecified cervical region: Secondary | ICD-10-CM | POA: Diagnosis not present

## 2021-03-26 DIAGNOSIS — R7989 Other specified abnormal findings of blood chemistry: Secondary | ICD-10-CM

## 2021-03-26 DIAGNOSIS — M19072 Primary osteoarthritis, left ankle and foot: Secondary | ICD-10-CM

## 2021-03-26 DIAGNOSIS — F419 Anxiety disorder, unspecified: Secondary | ICD-10-CM

## 2021-03-26 DIAGNOSIS — F5101 Primary insomnia: Secondary | ICD-10-CM | POA: Diagnosis not present

## 2021-03-26 DIAGNOSIS — Z8619 Personal history of other infectious and parasitic diseases: Secondary | ICD-10-CM | POA: Diagnosis not present

## 2021-03-26 NOTE — Patient Instructions (Signed)
Hand Exercises Hand exercises can be helpful for almost anyone. These exercises can strengthen the hands, improve flexibility and movement, and increase blood flow to the hands. These results can make work and daily tasks easier. Hand exercises can be especially helpful for people who have joint pain from arthritis or have nerve damage from overuse (carpal tunnel syndrome). These exercises can also help people who have injured a hand. Exercises Most of these hand exercises are gentle stretching and motion exercises. It is usually safe to do them often throughout the day. Warming up your hands before exercise may help to reduce stiffness. You can do this with gentle massage or by placing your hands in warm water for 10-15 minutes. It is normal to feel some stretching, pulling, tightness, or mild discomfort as you begin new exercises. This will gradually improve. Stop an exercise right away if you feel sudden, severe pain or your pain gets worse. Ask your health care provider which exercises are best for you. Knuckle bend or "claw" fist  Stand or sit with your arm, hand, and all five fingers pointed straight up. Make sure to keep your wrist straight during the exercise. Gently bend your fingers down toward your palm until the tips of your fingers are touching the top of your palm. Keep your big knuckle straight and just bend the small knuckles in your fingers. Hold this position for __________ seconds. Straighten (extend) your fingers back to the starting position. Repeat this exercise 5-10 times with each hand. Full finger fist  Stand or sit with your arm, hand, and all five fingers pointed straight up. Make sure to keep your wrist straight during the exercise. Gently bend your fingers into your palm until the tips of your fingers are touching the middle of your palm. Hold this position for __________ seconds. Extend your fingers back to the starting position, stretching every joint fully. Repeat  this exercise 5-10 times with each hand. Straight fist Stand or sit with your arm, hand, and all five fingers pointed straight up. Make sure to keep your wrist straight during the exercise. Gently bend your fingers at the big knuckle, where your fingers meet your hand, and the middle knuckle. Keep the knuckle at the tips of your fingers straight and try to touch the bottom of your palm. Hold this position for __________ seconds. Extend your fingers back to the starting position, stretching every joint fully. Repeat this exercise 5-10 times with each hand. Tabletop  Stand or sit with your arm, hand, and all five fingers pointed straight up. Make sure to keep your wrist straight during the exercise. Gently bend your fingers at the big knuckle, where your fingers meet your hand, as far down as you can while keeping the small knuckles in your fingers straight. Think of forming a tabletop with your fingers. Hold this position for __________ seconds. Extend your fingers back to the starting position, stretching every joint fully. Repeat this exercise 5-10 times with each hand. Finger spread  Place your hand flat on a table with your palm facing down. Make sure your wrist stays straight as you do this exercise. Spread your fingers and thumb apart from each other as far as you can until you feel a gentle stretch. Hold this position for __________ seconds. Bring your fingers and thumb tight together again. Hold this position for __________ seconds. Repeat this exercise 5-10 times with each hand. Making circles  Stand or sit with your arm, hand, and all five fingers pointed   straight up. Make sure to keep your wrist straight during the exercise. Make a circle by touching the tip of your thumb to the tip of your index finger. Hold for __________ seconds. Then open your hand wide. Repeat this motion with your thumb and each finger on your hand. Repeat this exercise 5-10 times with each hand. Thumb  motion  Sit with your forearm resting on a table and your wrist straight. Your thumb should be facing up toward the ceiling. Keep your fingers relaxed as you move your thumb. Lift your thumb up as high as you can toward the ceiling. Hold for __________ seconds. Bend your thumb across your palm as far as you can, reaching the tip of your thumb for the small finger (pinkie) side of your palm. Hold for __________ seconds. Repeat this exercise 5-10 times with each hand. Grip strengthening  Hold a stress ball or other soft ball in the middle of your hand. Slowly increase the pressure, squeezing the ball as much as you can without causing pain. Think of bringing the tips of your fingers into the middle of your palm. All of your finger joints should bend when doing this exercise. Hold your squeeze for __________ seconds, then relax. Repeat this exercise 5-10 times with each hand. Contact a health care provider if: Your hand pain or discomfort gets much worse when you do an exercise. Your hand pain or discomfort does not improve within 2 hours after you exercise. If you have any of these problems, stop doing these exercises right away. Do not do them again unless your health care provider says that you can. Get help right away if: You develop sudden, severe hand pain or swelling. If this happens, stop doing these exercises right away. Do not do them again unless your health care provider says that you can. This information is not intended to replace advice given to you by your health care provider. Make sure you discuss any questions you have with your health care provider. Document Revised: 08/09/2020 Document Reviewed: 08/09/2020 Elsevier Patient Education  2022 [employer]vier Inc.  

## 2021-03-27 LAB — CBC WITH DIFFERENTIAL/PLATELET
Absolute Monocytes: 459 cells/uL (ref 200–950)
Basophils Absolute: 61 cells/uL (ref 0–200)
Basophils Relative: 1.2 %
Eosinophils Absolute: 270 cells/uL (ref 15–500)
Eosinophils Relative: 5.3 %
HCT: 41.6 % (ref 35.0–45.0)
Hemoglobin: 13.6 g/dL (ref 11.7–15.5)
Lymphs Abs: 1188 cells/uL (ref 850–3900)
MCH: 29.6 pg (ref 27.0–33.0)
MCHC: 32.7 g/dL (ref 32.0–36.0)
MCV: 90.6 fL (ref 80.0–100.0)
MPV: 11.9 fL (ref 7.5–12.5)
Monocytes Relative: 9 %
Neutro Abs: 3121 cells/uL (ref 1500–7800)
Neutrophils Relative %: 61.2 %
Platelets: 232 10*3/uL (ref 140–400)
RBC: 4.59 10*6/uL (ref 3.80–5.10)
RDW: 11.6 % (ref 11.0–15.0)
Total Lymphocyte: 23.3 %
WBC: 5.1 10*3/uL (ref 3.8–10.8)

## 2021-03-27 LAB — COMPLETE METABOLIC PANEL WITH GFR
AG Ratio: 1.9 (calc) (ref 1.0–2.5)
ALT: 13 U/L (ref 6–29)
AST: 19 U/L (ref 10–35)
Albumin: 3.8 g/dL (ref 3.6–5.1)
Alkaline phosphatase (APISO): 69 U/L (ref 37–153)
BUN: 15 mg/dL (ref 7–25)
CO2: 28 mmol/L (ref 20–32)
Calcium: 9.7 mg/dL (ref 8.6–10.4)
Chloride: 108 mmol/L (ref 98–110)
Creat: 0.85 mg/dL (ref 0.60–1.00)
Globulin: 2 g/dL (calc) (ref 1.9–3.7)
Glucose, Bld: 84 mg/dL (ref 65–99)
Potassium: 5 mmol/L (ref 3.5–5.3)
Sodium: 141 mmol/L (ref 135–146)
Total Bilirubin: 0.5 mg/dL (ref 0.2–1.2)
Total Protein: 5.8 g/dL — ABNORMAL LOW (ref 6.1–8.1)
eGFR: 71 mL/min/{1.73_m2} (ref >=60)

## 2021-03-27 NOTE — Progress Notes (Signed)
Total protein is borderline low-5.8-stable. rest of CMP WNL.  CBC WNL.

## 2021-04-23 ENCOUNTER — Ambulatory Visit: Payer: Medicare Other | Admitting: Podiatry

## 2021-04-24 ENCOUNTER — Ambulatory Visit: Payer: Medicare Other | Admitting: Podiatry

## 2021-05-08 ENCOUNTER — Ambulatory Visit: Payer: Medicare Other | Admitting: Podiatry

## 2021-05-15 ENCOUNTER — Other Ambulatory Visit: Payer: Self-pay

## 2021-05-15 ENCOUNTER — Ambulatory Visit (INDEPENDENT_AMBULATORY_CARE_PROVIDER_SITE_OTHER): Payer: Medicare Other | Admitting: Podiatry

## 2021-05-15 ENCOUNTER — Encounter: Payer: Self-pay | Admitting: Podiatry

## 2021-05-15 DIAGNOSIS — L600 Ingrowing nail: Secondary | ICD-10-CM

## 2021-05-15 NOTE — Progress Notes (Signed)
°  Subjective:  Patient ID: Kristen Jacobs, female    DOB: 08-16-1945,   MRN: [id number]  Chief Complaint  Patient presents with   Toe Pain    Pt is here for right toe pain (tip) pain has been going on since last visit.     76 y.o. female presents for concern of right to pain possible from ingrown toenail. Patient relates this is the same since she last saw Dr. Sherryle Lis and had her left ingrown nail removed.  . Denies any other pedal complaints. Denies n/v/f/c.   Past Medical History:  Diagnosis Date   Constipation    Hepatitis    History of UTI 11/22/2020   Recent event, fatigue w/o dysuria, symptoms resolved with antibiotic (name not recalled).  Urine POC dipstick normal today.    Joint pain     Objective:  Physical Exam: Vascular: DP/PT pulses 2/4 bilateral. CFT <3 seconds. Normal hair growth on digits. No edema.  Skin. No lacerations or abrasions bilateral feet. Incurvation noted to medial border of right hallux with hyperkeratotic tissue in the nail fold.  Musculoskeletal: MMT 5/5 bilateral lower extremities in DF, PF, Inversion and Eversion. Deceased ROM in DF of ankle joint.  Neurological: Sensation intact to light touch.   Assessment:   1. Ingrowing right great toenail      Plan:  Patient was evaluated and treated and all questions answered. Toe was evaluated and appears to have some incurvation at the distal nail.  Discussed moisturizing and use of pumice stone to keep callus under control. Discussed using floss to help guide the nail out of the skin.  Nail was trimmed in slant back fashion today. Discussed if continued pain can try another procedure on this nail.  May discontinue soaks and neosporin.  Patient to follow-up as needed.    Lorenda Peck, DPM

## 2021-06-13 ENCOUNTER — Other Ambulatory Visit: Payer: Self-pay | Admitting: Physician Assistant

## 2021-06-13 NOTE — Telephone Encounter (Signed)
Next Visit: 08/21/2021  Last Visit: 03/26/2021  Labs: 03/26/2021 Total protein is borderline low-5.8-stable. rest of CMP WNL.  CBC WNL.    Eye exam: WNL 08/14/2020    Current Dose per office note 03/26/2021: Plaquenil 200 mg 1 tablet by mouth twice daily Monday to Friday   BE:[id number] arthritis involving multiple sites with positive rheumatoid factor   Last Fill: 01/23/2021  Okay to refill Plaquenil?

## 2021-07-02 ENCOUNTER — Other Ambulatory Visit: Payer: Self-pay | Admitting: Sports Medicine

## 2021-07-02 DIAGNOSIS — M5412 Radiculopathy, cervical region: Secondary | ICD-10-CM

## 2021-07-03 MED ORDER — TRAMADOL HCL 50 MG PO TABS: 50.0000 mg | ORAL_TABLET | Freq: Three times a day (TID) | ORAL | 0 refills | Status: DC | PRN

## 2021-07-16 ENCOUNTER — Ambulatory Visit (INDEPENDENT_AMBULATORY_CARE_PROVIDER_SITE_OTHER): Payer: Medicare Other | Admitting: Internal Medicine

## 2021-07-16 ENCOUNTER — Encounter: Payer: Self-pay | Admitting: Internal Medicine

## 2021-07-16 VITALS — BP 101/57 | HR 73 | Temp 97.6°F | Ht 66.0 in | Wt 142.4 lb

## 2021-07-16 DIAGNOSIS — M25559 Pain in unspecified hip: Secondary | ICD-10-CM | POA: Diagnosis not present

## 2021-07-16 DIAGNOSIS — M76891 Other specified enthesopathies of right lower limb, excluding foot: Secondary | ICD-10-CM | POA: Diagnosis not present

## 2021-07-16 NOTE — Assessment & Plan Note (Signed)
Patient reports sharp stabbing pain in her right hip that is intermittent in nature. She reports exacerbation of pain when raising leg up during pilades. She reports it "feels more muscular". Denies weakness, numbness, or tingling.  ?Pain is reproducible with flexion. ROM normal. No bony tenderness. Inversion of leg does not produce pain. FADIR test negative.  ?Suspect exacerbation of flexor tendinitis, however, patient has a history of arthritis. It does not appear she has ever had imaging of her hip. We will obtain imaging to help guide treatment and r/o arthritis of her hip. If imaging does not indicate arthritis as likely etiology, we will treat as tendinitis with ice and rest.  ?

## 2021-07-16 NOTE — Progress Notes (Signed)
? ?  CC: hip pain ? ?HPI:[name] is a 76 y.o. female who presents for evaluation of hip pain. Please see individual problem based A/P for details. ? ?Depression, PHQ-9: ?Based on the patients  ?[facility] Office Visit from 07/16/2021 in [place]  ?PHQ-9 Total Score 0  ? ?  ? score we have 0. ? ?Past Medical History:  ?Diagnosis Date  ? Constipation   ? Hepatitis   ? History of UTI 11/22/2020  ? Recent event, fatigue w/o dysuria, symptoms resolved with antibiotic (name not recalled).  Urine POC dipstick normal today.   ? Joint pain   ? ?Review of Systems:   ?Review of Systems  ?Constitutional: Negative.   ?Respiratory: Negative.    ?Cardiovascular: Negative.   ?Gastrointestinal: Negative.   ?Genitourinary: Negative.   ?Musculoskeletal:  Negative for falls, myalgias and neck pain.  ?     Hip pain  ?Skin: Negative.   ?Neurological: Negative.    ? ?Physical Exam: ?Vitals:  ? 07/16/21 1031  ?BP: (!) 101/57  ?Pulse: 73  ?Temp: 97.6 ?F (36.4 ?C)  ?TempSrc: Oral  ?SpO2: 100%  ?Weight: 142 lb 6.4 oz (64.6 kg)  ?Height: '5\' 6"'$  (1.676 m)  ? ? ? ?General: alert and oriented ?HEENT: Conjunctiva nl , antiicteric sclerae, moist mucous membranes, no exudate or erythema ?Cardiovascular: Normal rate, regular rhythm.  No murmurs, rubs, or gallops ?Pulmonary : Equal breath sounds, No wheezes, rales, or rhonchi ?Abdominal: soft, nontender,  bowel sounds present ?Ext: No edema in lower extremities, no tenderness to palpation of lower extremities. Reproducible pain with flexion of hip. No bony tenderness or overlying skin changes. No change in strength or sensation. FADIR negative.  ? ?Assessment & Plan:  ? ?See Encounters Tab for problem based charting. ? ?Patient discussed with Dr. Evette Doffing ? ?

## 2021-07-16 NOTE — Patient Instructions (Addendum)
Dear Mrs. Kristen Jacobs, ? ?Thank you for trusting [place] with your care today.  ? ?We evaluated you for hip pain.  ?We will order an x-ray to evaluate your hip. We will call you with these results. ?Please take tylenol for your pain. If your pain is due to tendinitis, we recommend ice and rest. ? ?

## 2021-07-18 NOTE — Progress Notes (Signed)
Internal Medicine Clinic Attending ° °Case discussed with Dr. Gawaluck  At the time of the visit.  We reviewed the resident’s history and exam and pertinent patient test results.  I agree with the assessment, diagnosis, and plan of care documented in the resident’s note.  °

## 2021-07-23 ENCOUNTER — Encounter: Payer: Medicare Other | Admitting: Internal Medicine

## 2021-07-31 ENCOUNTER — Other Ambulatory Visit: Payer: Self-pay | Admitting: Sports Medicine

## 2021-07-31 ENCOUNTER — Other Ambulatory Visit: Payer: Self-pay

## 2021-07-31 ENCOUNTER — Ambulatory Visit (INDEPENDENT_AMBULATORY_CARE_PROVIDER_SITE_OTHER): Payer: Medicare Other | Admitting: Sports Medicine

## 2021-07-31 ENCOUNTER — Ambulatory Visit (INDEPENDENT_AMBULATORY_CARE_PROVIDER_SITE_OTHER): Payer: Medicare Other

## 2021-07-31 DIAGNOSIS — M25551 Pain in right hip: Secondary | ICD-10-CM | POA: Diagnosis not present

## 2021-07-31 DIAGNOSIS — M76891 Other specified enthesopathies of right lower limb, excluding foot: Secondary | ICD-10-CM

## 2021-07-31 DIAGNOSIS — G8929 Other chronic pain: Secondary | ICD-10-CM | POA: Diagnosis not present

## 2021-07-31 DIAGNOSIS — M5412 Radiculopathy, cervical region: Secondary | ICD-10-CM

## 2021-07-31 IMAGING — DX DG HIP (WITH OR WITHOUT PELVIS) 2-3V*R*
3 series · 3 of 3 positions shown · non-contrast
Comparison: None.

CLINICAL DATA: Chronic right hip pain without known injury.

EXAM:
DG HIP (WITH OR WITHOUT PELVIS) 2-3V RIGHT

[pelvis ap]
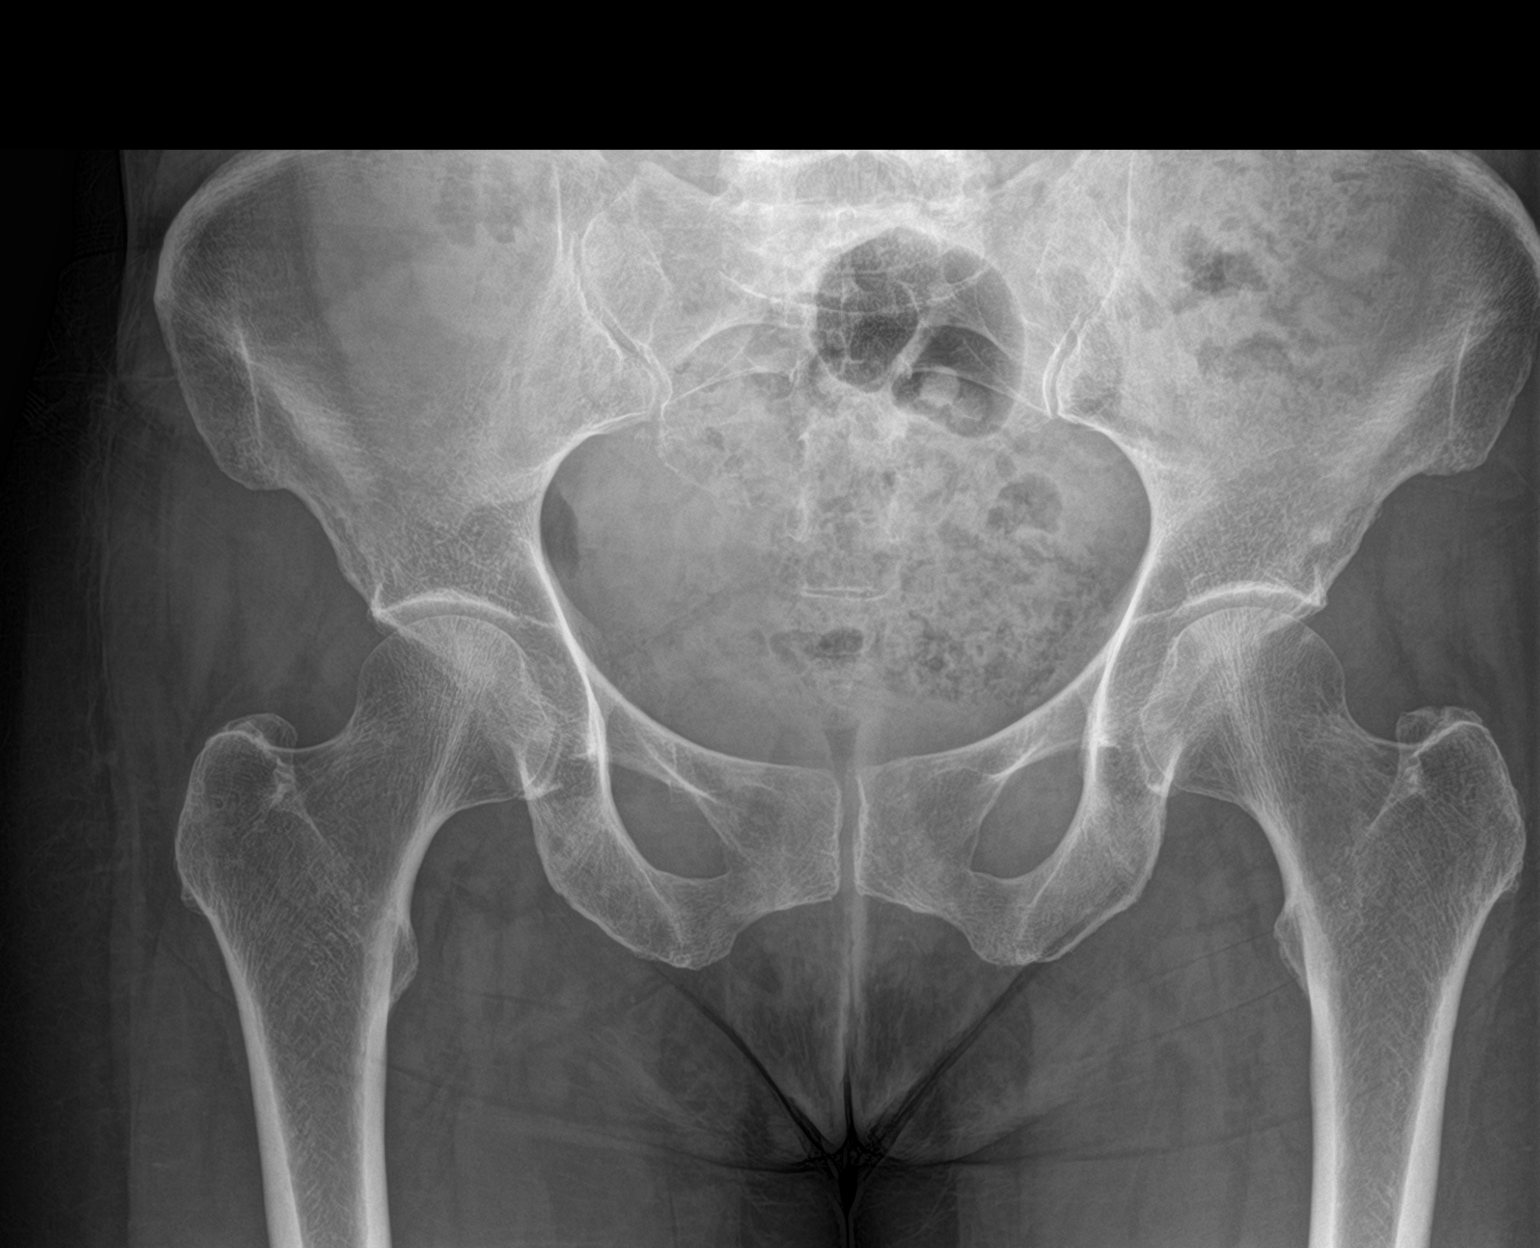

[hip ap]
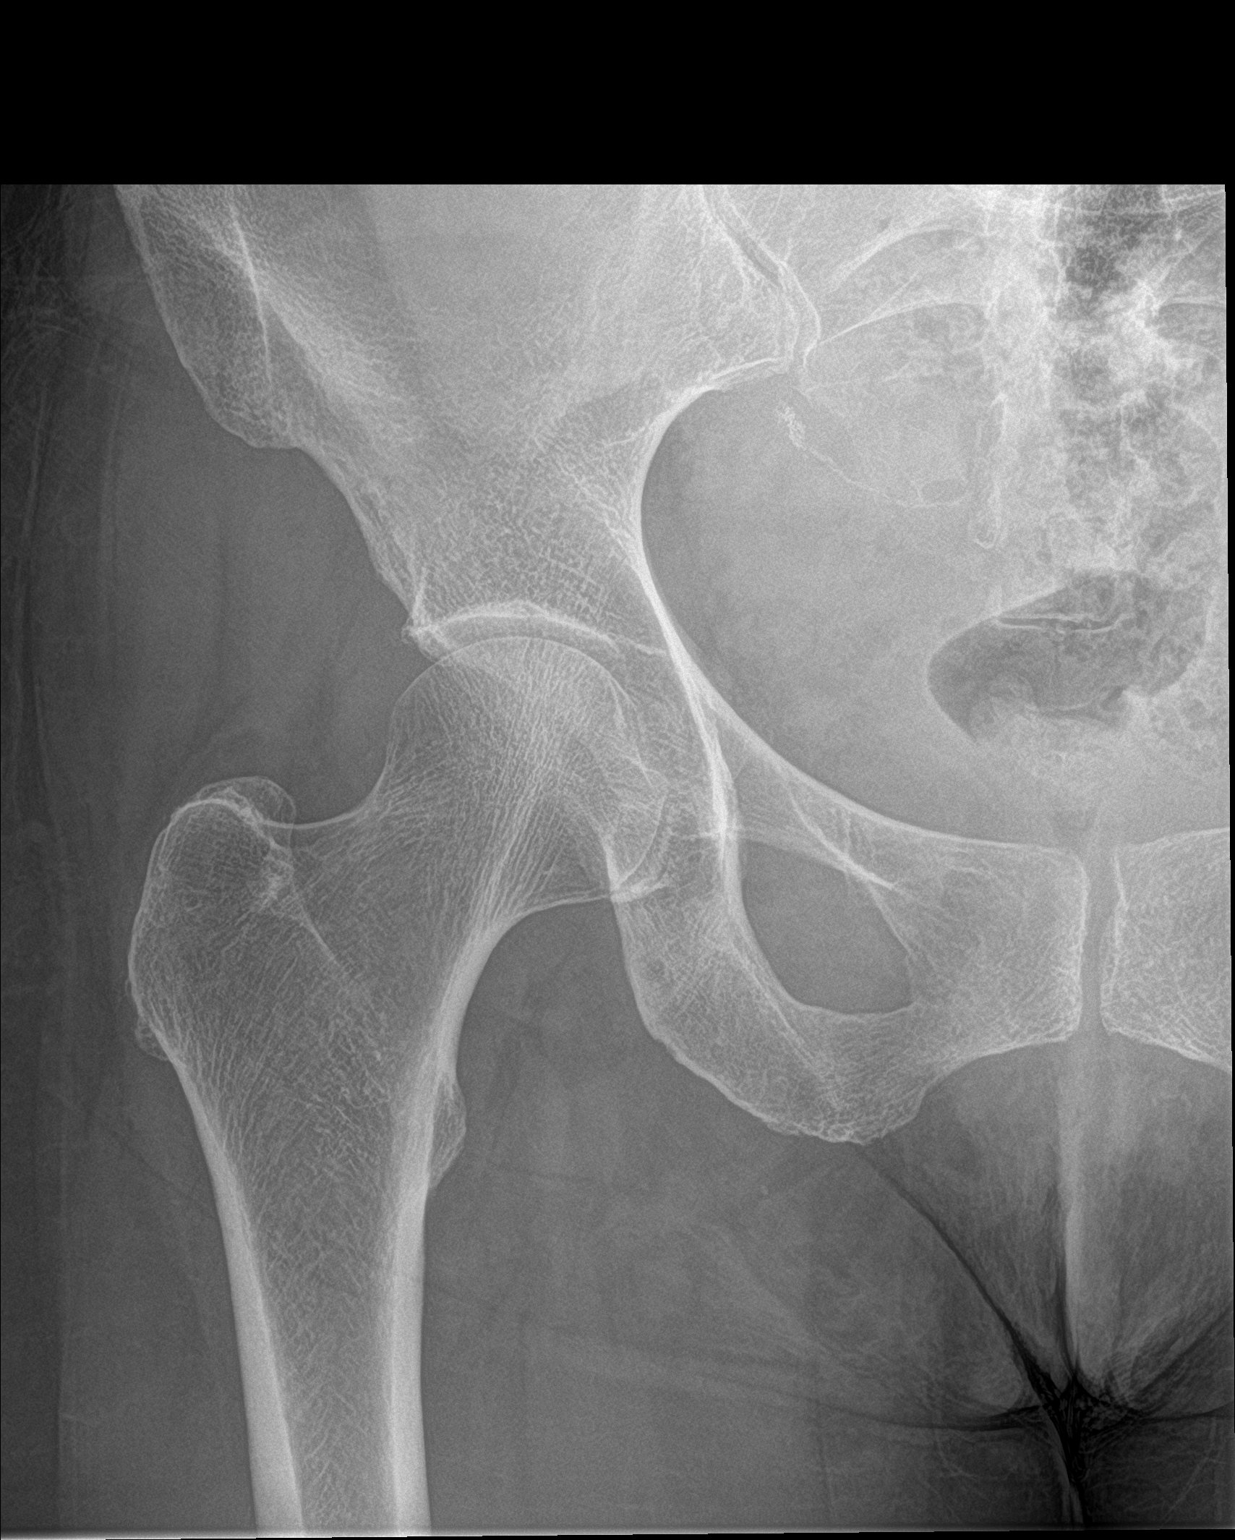

[hip lat]
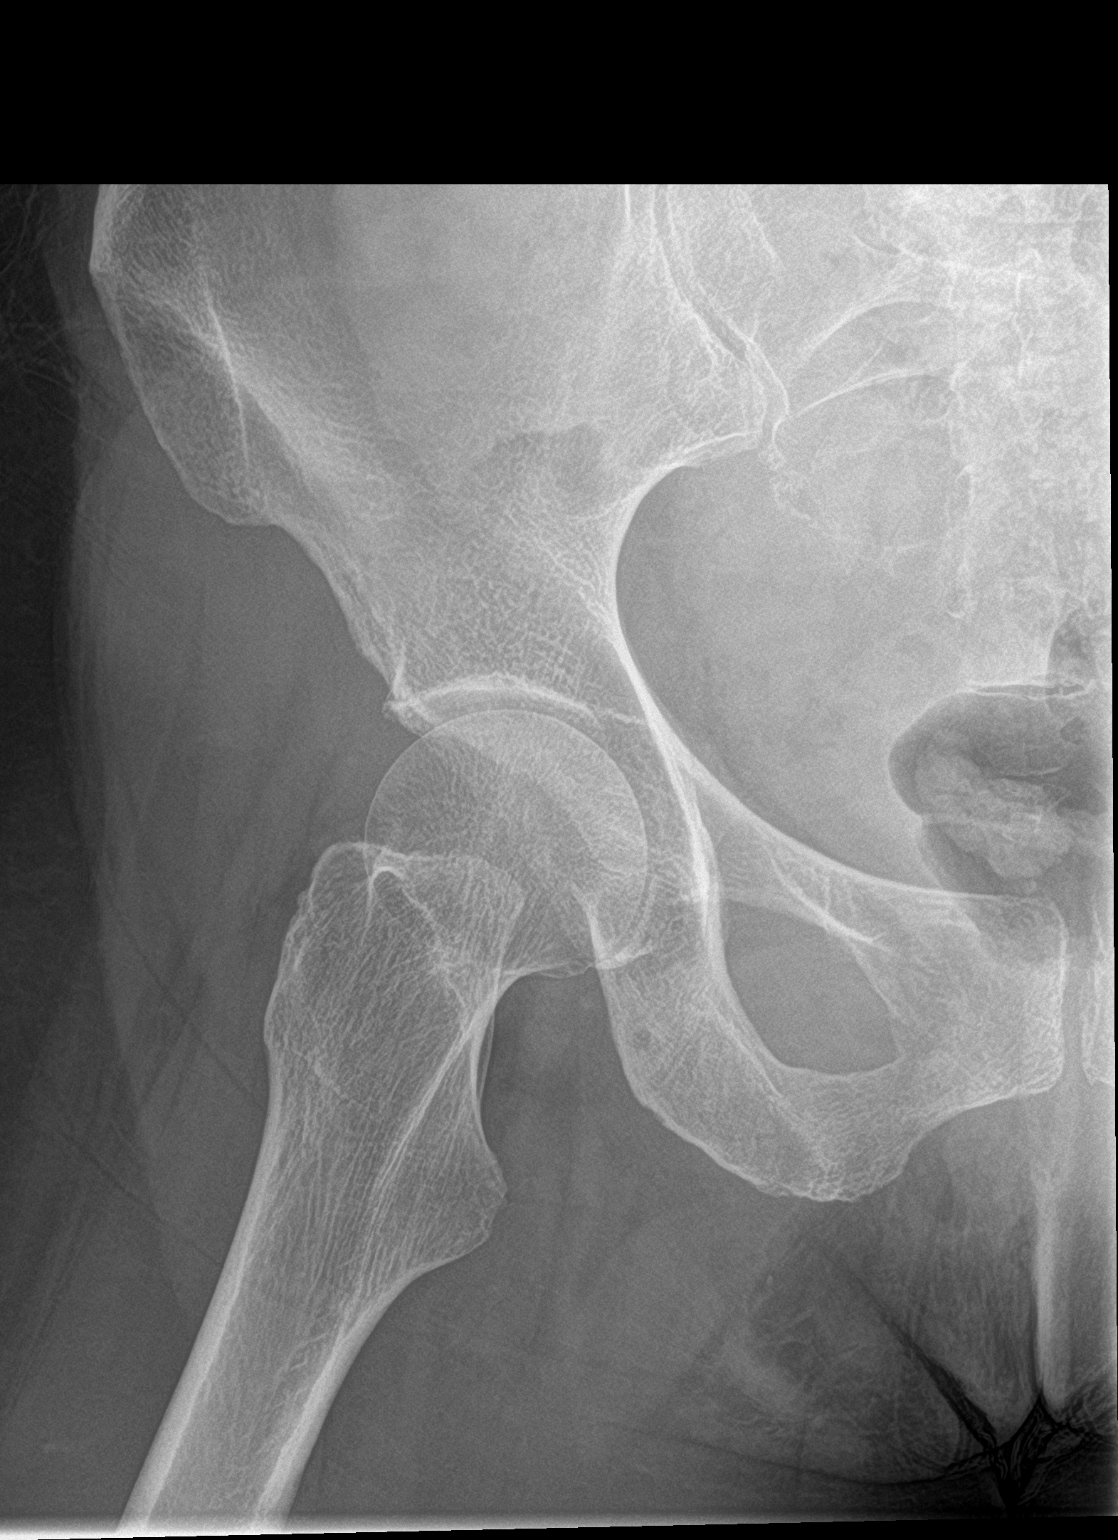

[3 of 3 positions shown; findings below may reference images not displayed]

FINDINGS: There is no evidence of hip fracture or dislocation. There is no
evidence of arthropathy or other focal bone abnormality.
IMPRESSION: Negative.

## 2021-07-31 NOTE — Progress Notes (Signed)
? ? ?  Procedures performed today:   ? ?None. ? ?Independent interpretation of notes and tests performed by another provider:  ? ?None. ? ?Brief History, Exam, Impression, and Recommendations:   ? ?Hip flexor tendinitis, right ?This is a very pleasant 76 year old female, I have seen her in the past, 4 years ago for right hip pain, I diagnosed her with a hip flexor tendinitis which resolved after conservative treatment. ?She was doing well until recently, 2 to 3 weeks ago, started to have increasing pain right groin with radiation to the buttock. ?Worse with hip flexion. ?On exam she has good motion, good strength in all directions, she has a negative FABER and negative FADIR sign. ?No tenderness over the greater trochanter. ?She does have reproduction of pain with resisted hip flexion. ?She saw one of the internal medicine residents, I would agree that this is a hip flexor tendinitis, we will start her on a hip flexor conditioning regimen, she can take tramadol that she has at home, we will go ahead and get the x-rays, as she did not get them at her previous appointment. ?Return to see me in approximately 4 weeks, if insufficient improvement we will proceed with MR arthrography to evaluate for hip labral tear or other pathology such as AVN, hernia, other soft tissue injuries.  I would like to see her on a Monday or Tuesday so that I can do the ultrasound-guided gadolinium injection in the office and then just send her down to the MRI scanner. ? ? ? ?___________________________________________ ?Gwen Her. Dianah Field, M.D., ABFM., CAQSM. ?Primary Care and Sports Medicine ?[place] ? ?Adjunct Instructor of Family Medicine  ?[employer] ?

## 2021-07-31 NOTE — Assessment & Plan Note (Signed)
This is a very pleasant 76 year old female, I have seen her in the past, 4 years ago for right hip pain, I diagnosed her with a hip flexor tendinitis which resolved after conservative treatment. ?She was doing well until recently, 2 to 3 weeks ago, started to have increasing pain right groin with radiation to the buttock. ?Worse with hip flexion. ?On exam she has good motion, good strength in all directions, she has a negative FABER and negative FADIR sign. ?No tenderness over the greater trochanter. ?She does have reproduction of pain with resisted hip flexion. ?She saw one of the internal medicine residents, I would agree that this is a hip flexor tendinitis, we will start her on a hip flexor conditioning regimen, she can take tramadol that she has at home, we will go ahead and get the x-rays, as she did not get them at her previous appointment. ?Return to see me in approximately 4 weeks, if insufficient improvement we will proceed with MR arthrography to evaluate for hip labral tear or other pathology such as AVN, hernia, other soft tissue injuries.  I would like to see her on a Monday or Tuesday so that I can do the ultrasound-guided gadolinium injection in the office and then just send her down to the MRI scanner. ?

## 2021-08-03 ENCOUNTER — Encounter: Payer: Self-pay | Admitting: Sports Medicine

## 2021-08-03 DIAGNOSIS — M5412 Radiculopathy, cervical region: Secondary | ICD-10-CM

## 2021-08-05 DIAGNOSIS — N951 Menopausal and female climacteric states: Secondary | ICD-10-CM | POA: Diagnosis not present

## 2021-08-07 NOTE — Progress Notes (Signed)
? ?Office Visit Note ? ?Patient: Kristen Jacobs             ?Date of Birth: Jul 03, 1945           ?MRN: [id number]             ?PCP: Angelica Pou, MD ?Referring: Angelica Pou, MD ?Visit Date: 08/21/2021 ?Occupation: '@GUAROCC'$ @ ? ?Subjective:  ?fatigue ? ?History of Present Illness: Kristen Jacobs is a 76 y.o. female with history of osteoarthritis.  She states she continues to have pain and discomfort in her joints which include her knee joints, neck, and left elbow.  She states she has some generalized pain.  She has been experiencing increased fatigue.  She states she will be starting on some hormone replacement therapy as she was diagnosed with estradiol and testosterone deficiency.  She was seen by a bioidentical doctor. ? ?Activities of Daily Living:  ?Patient reports morning stiffness for a few minutes.   ?Patient Reports nocturnal pain.  ?Difficulty dressing/grooming: Denies ?Difficulty climbing stairs: Denies ?Difficulty getting out of chair: Reports ?Difficulty using hands for taps, buttons, cutlery, and/or writing: Reports ? ?Review of Systems  ?Constitutional:  Positive for fatigue.  ?HENT:  Positive for mouth dryness. Negative for mouth sores and nose dryness.   ?Eyes:  Positive for dryness. Negative for pain and itching.  ?Respiratory:  Negative for shortness of breath and difficulty breathing.   ?Cardiovascular:  Negative for chest pain and palpitations.  ?Gastrointestinal:  Negative for blood in stool, constipation and diarrhea.  ?Endocrine: Negative for increased urination.  ?Genitourinary:  Negative for difficulty urinating.  ?Musculoskeletal:  Positive for joint pain, joint pain, myalgias, morning stiffness, muscle tenderness and myalgias. Negative for joint swelling.  ?Skin:  Negative for color change, rash and redness.  ?Allergic/Immunologic: Negative for susceptible to infections.  ?Neurological:  Positive for weakness. Negative for dizziness, numbness, headaches and memory loss.   ?Hematological:  Negative for bruising/bleeding tendency.  ?Psychiatric/Behavioral:  Negative for confusion.   ? ?PMFS History:  ?Patient Active Problem List  ? Diagnosis Date Noted  ? Fear of vaccinations 02/22/2021  ? History of malignant neoplasm of skin 11/22/2020  ? Age-related osteoporosis without fracture 11/22/2020  ? Major depression in remission ([place]) 11/22/2020  ? Sensation of chest tightness 11/22/2020  ? History of hepatitis C 04/05/2020  ? Cervical spondylosis with radiculopathy 02/21/2020  ? Rheumatoid factor positive 02/17/2020  ? Laryngopharyngeal reflux (LPR) 12/20/2019  ? Avulsion fracture of left talus 09/05/2019  ? Primary osteoarthritis of left knee 07/19/2019  ? Subacromial bursitis of left shoulder joint 07/19/2019  ? 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  ? Radiculitis of right cervical region 11/12/2017  ? Chronic right shoulder pain 11/12/2017  ? Pain of right sacroiliac joint 09/10/2017  ? Hip flexor tendinitis, right 08/11/2017  ?  ?Past Medical History:  ?Diagnosis Date  ? Constipation   ? Hepatitis   ? History of UTI 11/22/2020  ? Recent event, fatigue w/o dysuria, symptoms resolved with antibiotic (name not recalled).  Urine POC dipstick normal today.   ? Joint pain   ?  ?Family History  ?Problem Relation Age of Onset  ? Dementia Mother   ? Cancer Father   ? Bipolar disorder Sister   ? Leukemia Brother   ? Healthy Son   ? Asthma Son   ? Healthy Son   ? ?Past Surgical History:  ?Procedure Laterality Date  ? BREAST BIOPSY Bilateral   ?  benign  ? CATARACT EXTRACTION Right 03/2020  ? LAPAROSCOPY    ? LIVER BIOPSY    ? OOPHORECTOMY    ? right   ? TONSILLECTOMY    ? age 77   ? ?Social History  ? ?Social History Narrative  ? From [place], first generation American, family is [place]  Moved to [facility] in late 2010's.  Enjoys healthful living, Mediterranean diet, staying active.  First husband passed away  due to complications from diabetes.  She has since remarried.  ? ?Immunization History  ?Administered Date(s) Administered  ? Moderna Sars-Covid-2 Vaccination 11/17/2019, 12/05/2019, 01/02/2020  ?  ? ?Objective: ?Vital Signs: BP 102/62 (BP Location: Left Arm, Patient Position: Sitting, Cuff Size: Small)   Pulse (!) 58   Resp 13   Ht '5\' 5"'$  (1.651 m)   Wt 141 lb (64 kg)   BMI 23.46 kg/m?   ? ?Physical Exam ?Vitals and nursing note reviewed.  ?Constitutional:   ?   Appearance: She is well-developed.  ?HENT:  ?   Head: Normocephalic and atraumatic.  ?Eyes:  ?   Conjunctiva/sclera: Conjunctivae normal.  ?Cardiovascular:  ?   Rate and Rhythm: Normal rate and regular rhythm.  ?   Heart sounds: Normal heart sounds.  ?Pulmonary:  ?   Effort: Pulmonary effort is normal.  ?   Breath sounds: Normal breath sounds.  ?Abdominal:  ?   General: Bowel sounds are normal.  ?   Palpations: Abdomen is soft.  ?Musculoskeletal:  ?   Cervical back: Normal range of motion.  ?Lymphadenopathy:  ?   Cervical: No cervical adenopathy.  ?Skin: ?   General: Skin is warm and dry.  ?   Capillary Refill: Capillary refill takes less than 2 seconds.  ?Neurological:  ?   Mental Status: She is alert and oriented to person, place, and time.  ?Psychiatric:     ?   Behavior: Behavior normal.  ?  ? ?Musculoskeletal Exam: She had limited painful range of motion of her cervical spine.  Shoulder joints, elbow joints, wrist joints were in good range of motion with no synovitis.  She had tenderness over left lateral epicondyle region.  She had bilateral PIP and DIP thickening with no synovitis.  Hip joints and knee joints with good range of motion.  She had no tenderness over ankles or MTPs. ? ?CDAI Exam: ?CDAI Score: -- ?Patient Global: --; Provider Global: -- ?Swollen: --; Tender: -- ?Joint Exam 08/21/2021  ? ?No joint exam has been documented for this visit  ? ?There is currently no information documented on the homunculus. Go to the Rheumatology activity  and complete the homunculus joint exam. ? ?Investigation: ?No additional findings. ? ?Imaging: ?DG HIP UNILAT W OR W/O PELVIS 2-3 VIEWS RIGHT ? ?Result Date: 08/01/2021 ?CLINICAL DATA:  Chronic right hip pain without known injury. EXAM: DG HIP (WITH OR WITHOUT PELVIS) 2-3V RIGHT COMPARISON:  None. FINDINGS: There is no evidence of hip fracture or dislocation. There is no evidence of arthropathy or other focal bone abnormality. IMPRESSION: Negative. Electronically Signed   By: Marijo Conception M.D.   On: 08/01/2021 14:43   ? ?Recent Labs: ?Lab Results  ?Component Value Date  ? WBC 5.1 03/26/2021  ? HGB 13.6 03/26/2021  ? PLT 232 03/26/2021  ? NA 141 03/26/2021  ? K 5.0 03/26/2021  ? CL 108 03/26/2021  ? CO2 28 03/26/2021  ? GLUCOSE 84 03/26/2021  ? BUN 15 03/26/2021  ? CREATININE 0.85 03/26/2021  ?  BILITOT 0.5 03/26/2021  ? ALKPHOS 87 01/24/2021  ? AST 19 03/26/2021  ? ALT 13 03/26/2021  ? PROT 5.8 (L) 03/26/2021  ? ALBUMIN 4.2 01/24/2021  ? CALCIUM 9.7 03/26/2021  ? GFRAA 67 10/25/2020  ? ? ?Speciality Comments: PLQ eye exam WNL 08/14/2020 [facility] f/u 12 months ? ?Procedures:  ?No procedures performed ?Allergies: Other and Sulfa antibiotics  ? ?Assessment / Plan:     ?Visit Diagnoses: Rheumatoid arthritis involving multiple sites with positive rheumatoid factor (HCC) - MRI of the wrist joint showed synovitis, tenosynovitis and possible erosions: She had no synovitis on my examination.  She has been tolerating hydroxychloroquine without any side effects. ? ?High risk medication use - Plaquenil 200 mg 1 tablet by mouth twice daily Monday to Friday start date March 08, 2020. PLQ eye exam WNL 08/14/2020.-Labs from November 2022 were reviewed which were within normal limits.  Plan: CBC with Differential/Platelet, COMPLETE METABOLIC PANEL WITH GFR.  We will check labs today. ? ?Pain in both hands - X-ray of bilateral hands showed cystic versus erosive changes in the carpals.  No synovitis was noted.   Bilateral PIP and DIP thickening was noted. ? ?Lateral epicondylitis of left elbow-she had tenderness on palpation over left elbow.  A handout on exercises was given. ? ?Primary osteoarthritis of left knee-she had good r

## 2021-08-08 MED ORDER — TRAMADOL HCL 50 MG PO TABS: 50.0000 mg | ORAL_TABLET | Freq: Three times a day (TID) | ORAL | 3 refills | Status: DC | PRN

## 2021-08-12 DIAGNOSIS — Z6823 Body mass index (BMI) 23.0-23.9, adult: Secondary | ICD-10-CM | POA: Diagnosis not present

## 2021-08-12 DIAGNOSIS — N951 Menopausal and female climacteric states: Secondary | ICD-10-CM | POA: Diagnosis not present

## 2021-08-12 DIAGNOSIS — N898 Other specified noninflammatory disorders of vagina: Secondary | ICD-10-CM | POA: Diagnosis not present

## 2021-08-12 DIAGNOSIS — R232 Flushing: Secondary | ICD-10-CM | POA: Diagnosis not present

## 2021-08-13 DIAGNOSIS — Z20822 Contact with and (suspected) exposure to covid-19: Secondary | ICD-10-CM | POA: Diagnosis not present

## 2021-08-21 ENCOUNTER — Encounter: Payer: Self-pay | Admitting: Rheumatology

## 2021-08-21 ENCOUNTER — Ambulatory Visit (INDEPENDENT_AMBULATORY_CARE_PROVIDER_SITE_OTHER): Payer: Medicare Other | Admitting: Rheumatology

## 2021-08-21 VITALS — BP 102/62 | HR 58 | Resp 13 | Ht 65.0 in | Wt 141.0 lb

## 2021-08-21 DIAGNOSIS — M0579 Rheumatoid arthritis with rheumatoid factor of multiple sites without organ or systems involvement: Secondary | ICD-10-CM

## 2021-08-21 DIAGNOSIS — M79642 Pain in left hand: Secondary | ICD-10-CM

## 2021-08-21 DIAGNOSIS — M19072 Primary osteoarthritis, left ankle and foot: Secondary | ICD-10-CM

## 2021-08-21 DIAGNOSIS — M79641 Pain in right hand: Secondary | ICD-10-CM | POA: Diagnosis not present

## 2021-08-21 DIAGNOSIS — R5383 Other fatigue: Secondary | ICD-10-CM | POA: Diagnosis not present

## 2021-08-21 DIAGNOSIS — N951 Menopausal and female climacteric states: Secondary | ICD-10-CM | POA: Diagnosis not present

## 2021-08-21 DIAGNOSIS — M19071 Primary osteoarthritis, right ankle and foot: Secondary | ICD-10-CM | POA: Diagnosis not present

## 2021-08-21 DIAGNOSIS — S92155D Nondisplaced avulsion fracture (chip fracture) of left talus, subsequent encounter for fracture with routine healing: Secondary | ICD-10-CM

## 2021-08-21 DIAGNOSIS — Z79899 Other long term (current) drug therapy: Secondary | ICD-10-CM | POA: Diagnosis not present

## 2021-08-21 DIAGNOSIS — F5101 Primary insomnia: Secondary | ICD-10-CM | POA: Diagnosis not present

## 2021-08-21 DIAGNOSIS — M7712 Lateral epicondylitis, left elbow: Secondary | ICD-10-CM

## 2021-08-21 DIAGNOSIS — M1712 Unilateral primary osteoarthritis, left knee: Secondary | ICD-10-CM

## 2021-08-21 DIAGNOSIS — Z8619 Personal history of other infectious and parasitic diseases: Secondary | ICD-10-CM | POA: Diagnosis not present

## 2021-08-21 DIAGNOSIS — R7989 Other specified abnormal findings of blood chemistry: Secondary | ICD-10-CM

## 2021-08-21 DIAGNOSIS — F419 Anxiety disorder, unspecified: Secondary | ICD-10-CM | POA: Diagnosis not present

## 2021-08-21 DIAGNOSIS — M5030 Other cervical disc degeneration, unspecified cervical region: Secondary | ICD-10-CM

## 2021-08-21 NOTE — Patient Instructions (Addendum)
?Cervical Strain and Sprain Rehab ?Ask your health care provider which exercises are safe for you. Do exercises exactly as told by your health care provider and adjust them as directed. It is normal to feel mild stretching, pulling, tightness, or discomfort as you do these exercises. Stop right away if you feel sudden pain or your pain gets worse. Do not begin these exercises until told by your health care provider. ?Stretching and range-of-motion exercises ?Cervical side bending ? ?Using good posture, sit on a stable chair or stand up. ?Without moving your shoulders, slowly tilt your left / right ear to your shoulder until you feel a stretch in the opposite side neck muscles. You should be looking straight ahead. ?Hold for __________ seconds. ?Repeat with the other side of your neck. ?Repeat __________ times. Complete this exercise __________ times a day. ?Cervical rotation ? ?Using good posture, sit on a stable chair or stand up. ?Slowly turn your head to the side as if you are looking over your left / right shoulder. ?Keep your eyes level with the ground. ?Stop when you feel a stretch along the side and the back of your neck. ?Hold for __________ seconds. ?Repeat this by turning to your other side. ?Repeat __________ times. Complete this exercise __________ times a day. ?Thoracic extension and pectoral stretch ? ?Roll a towel or a small blanket so it is about 4 inches (10 cm) in diameter. ?Lie down on your back on a firm surface. ?Put the towel in the middle of your back across your spine. It should not be under your shoulder blades. ?Put your hands behind your head and let your elbows fall out to your sides. ?Hold for __________ seconds. ?Repeat __________ times. Complete this exercise __________ times a day. ?Strengthening exercises ?Isometric upper cervical flexion ? ?Lie on your back with a thin pillow behind your head and a small rolled-up towel under your neck. ?Gently tuck your chin toward your chest and  nod your head down to look toward your feet. Do not lift your head off the pillow. ?Hold for __________ seconds. ?Release the tension slowly. Relax your neck muscles completely before you repeat this exercise. ?Repeat __________ times. Complete this exercise __________ times a day. ?Isometric cervical extension ? ?Stand about 6 inches (15 cm) away from a wall, with your back facing the wall. ?Place a soft object, about 6-8 inches (15-20 cm) in diameter, between the back of your head and the wall. A soft object could be a small pillow, a ball, or a folded towel. ?Gently tilt your head back and press into the soft object. Keep your jaw and forehead relaxed. ?Hold for __________ seconds. ?Release the tension slowly. Relax your neck muscles completely before you repeat this exercise. ?Repeat __________ times. Complete this exercise __________ times a day. ?Posture and body mechanics ?Body mechanics refers to the movements and positions of your body while you do your daily activities. Posture is part of body mechanics. Good posture and healthy body mechanics can help to relieve stress in your body's tissues and joints. Good posture means that your spine is in its natural S-curve position (your spine is neutral), your shoulders are pulled back slightly, and your head is not tipped forward. The following are general guidelines for applying improved posture and body mechanics to your everyday activities. ?Sitting ? ?When sitting, keep your spine neutral and keep your feet flat on the floor. Use a footrest, if necessary, and keep your thighs parallel to the floor. Avoid rounding  your shoulders, and avoid tilting your head forward. ?When working at a desk or a computer, keep your desk at a height where your hands are slightly lower than your elbows. Slide your chair under your desk so you are close enough to maintain good posture. ?When working at a computer, place your monitor at a height where you are looking straight ahead  and you do not have to tilt your head forward or downward to look at the screen. ?Standing ? ?When standing, keep your spine neutral and keep your feet about hip-width apart. Keep a slight bend in your knees. Your ears, shoulders, and hips should line up. ?When you do a task in which you stand in one place for a long time, place one foot up on a stable object that is 2-4 inches (5-10 cm) high, such as a footstool. This helps keep your spine neutral. ?Resting ?When lying down and resting, avoid positions that are most painful for you. Try to support your neck in a neutral position. You can use a contour pillow or a small rolled-up towel. Your pillow should support your neck but not push on it. ?This information is not intended to replace advice given to you by your health care provider. Make sure you discuss any questions you have with your health care provider. ?Document Revised: 03/11/2021 Document Reviewed: 03/11/2021 ?Elsevier Patient Education ? [place]. ?Elbow and Forearm Exercises ?Ask your health care provider which exercises are safe for you. Do exercises exactly as told by your health care provider and adjust them as directed. It is normal to feel mild stretching, pulling, tightness, or discomfort as you do these exercises. Stop right away if you feel sudden pain or your pain gets worse. Do not begin these exercises until told by your health care provider. ?Range-of-motion exercises ?These exercises warm up your muscles and joints and improve the movement and flexibility of your injured elbow and forearm. The exercises also help to relieve pain, numbness, and tingling. These exercises are done using the muscles in your injured elbow and forearm (active). ?Elbow flexion, active ?Hold your left / right arm at your side, and bend your elbow (flexion) as far as you can using only your arm muscles. ?Hold this position for __________ seconds. ?Slowly return to the starting position. ?Repeat __________  times. Complete this exercise __________ times a day. ?Elbow extension, active ?Hold your left / right arm at your side, and straighten your elbow (extension) as much as you can using only your arm muscles. ?Hold this position for __________ seconds. ?Slowly return to the starting position. ?Repeat __________ times. Complete this exercise __________ times a day. ?Active forearm rotation, supination ?This is an exercise in which you turn (rotate) your forearm palm up (supination). ?Stand or sit with your elbows at your sides. ?Bend your left / right elbow to a 90-degree angle (right angle). ?Rotate your palm up until you feel a gentle stretch on the inside of your forearm. ?Hold this position for __________ seconds. ?Slowly return to the starting position. ?Repeat __________ times. Complete this exercise __________ times a day. ?Active forearm rotation, pronation ?This is an exercise in which you turn (rotate) your forearm palm down (pronation). ?Stand or sit with your elbows at your sides. ?Bend your left / right elbow to a 90-degree angle (right angle). ?Rotate your palm down until you feel a gentle stretch on the top of your forearm. ?Hold this position for __________ seconds. ?Slowly return to the starting position. ?Repeat  __________ times. Complete this exercise __________ times a day. ?Stretching exercises ?These exercises warm up your muscles and joints and improve the movement and flexibility of your injured elbow and forearm. These exercises also help to relieve pain, numbness, and tingling. These exercises are done using your healthy elbow and forearm to help stretch the muscles in your injured elbow and forearm (active-assisted). ?Elbow flexion, active-assisted ? ?Hold your left / right arm at your side, and bend your elbow (flexion) as much as you can using your left / right arm muscles. ?Use your other hand to bend your left / right elbow farther. To do this, gently push up on your forearm until you  feel a gentle stretch on the back of your elbow. ?Hold this position for __________ seconds. ?Slowly return to the starting position. ?Repeat __________ times. Complete this exercise __________ times a d

## 2021-08-22 LAB — COMPLETE METABOLIC PANEL WITH GFR
AG Ratio: 1.7 (calc) (ref 1.0–2.5)
ALT: 12 U/L (ref 6–29)
AST: 20 U/L (ref 10–35)
Albumin: 3.8 g/dL (ref 3.6–5.1)
Alkaline phosphatase (APISO): 76 U/L (ref 37–153)
BUN: 18 mg/dL (ref 7–25)
CO2: 26 mmol/L (ref 20–32)
Calcium: 9.7 mg/dL (ref 8.6–10.4)
Chloride: 101 mmol/L (ref 98–110)
Creat: 0.98 mg/dL (ref 0.60–1.00)
Globulin: 2.2 g/dL (calc) (ref 1.9–3.7)
Glucose, Bld: 77 mg/dL (ref 65–99)
Potassium: 4.5 mmol/L (ref 3.5–5.3)
Sodium: 135 mmol/L (ref 135–146)
Total Bilirubin: 0.5 mg/dL (ref 0.2–1.2)
Total Protein: 6 g/dL — ABNORMAL LOW (ref 6.1–8.1)
eGFR: 60 mL/min/{1.73_m2} (ref >=60)

## 2021-08-22 LAB — CBC WITH DIFFERENTIAL/PLATELET
Absolute Monocytes: 576 cells/uL (ref 200–950)
Basophils Absolute: 58 cells/uL (ref 0–200)
Basophils Relative: 0.9 %
Eosinophils Absolute: 224 cells/uL (ref 15–500)
Eosinophils Relative: 3.5 %
HCT: 41.2 % (ref 35.0–45.0)
Hemoglobin: 13.9 g/dL (ref 11.7–15.5)
Lymphs Abs: 1862 cells/uL (ref 850–3900)
MCH: 29.9 pg (ref 27.0–33.0)
MCHC: 33.7 g/dL (ref 32.0–36.0)
MCV: 88.6 fL (ref 80.0–100.0)
MPV: 11.7 fL (ref 7.5–12.5)
Monocytes Relative: 9 %
Neutro Abs: 3680 cells/uL (ref 1500–7800)
Neutrophils Relative %: 57.5 %
Platelets: 251 10*3/uL (ref 140–400)
RBC: 4.65 10*6/uL (ref 3.80–5.10)
RDW: 11.5 % (ref 11.0–15.0)
Total Lymphocyte: 29.1 %
WBC: 6.4 10*3/uL (ref 3.8–10.8)

## 2021-08-22 NOTE — Progress Notes (Signed)
CBC and CMP is stable.

## 2021-08-27 ENCOUNTER — Ambulatory Visit (INDEPENDENT_AMBULATORY_CARE_PROVIDER_SITE_OTHER): Payer: Medicare Other | Admitting: Sports Medicine

## 2021-08-27 DIAGNOSIS — M25551 Pain in right hip: Secondary | ICD-10-CM

## 2021-08-27 DIAGNOSIS — G8929 Other chronic pain: Secondary | ICD-10-CM

## 2021-08-27 DIAGNOSIS — M76891 Other specified enthesopathies of right lower limb, excluding foot: Secondary | ICD-10-CM

## 2021-08-27 MED ORDER — TRIAZOLAM 0.25 MG PO TABS
ORAL_TABLET | ORAL | 0 refills | Status: DC
Start: 2021-08-27 — End: 2021-10-31

## 2021-08-27 NOTE — Progress Notes (Signed)
? ? ?  Procedures performed today:   ? ?None. ? ?Independent interpretation of notes and tests performed by another provider:  ? ?None. ? ?Brief History, Exam, Impression, and Recommendations:   ? ?Chronic right hip pain ?This is a pleasant 76 year old female, she has now had about 7 weeks of pain with several visits to both internal medicine, and myself, right anterior hip pain, worse with flexion of the hip, initially suspected to be hip flexor tendinitis, she did over 6 weeks of physician directed home physical therapy, oral medications, and x-rays were unrevealing, unfortunately since she has not improved we will need to do an MR arthrogram looking for hip labral tear or other pathology such as AVN. ?Adding triazolam for preprocedural anxiolysis, [place] understands she will need a driver. ? ? ? ?___________________________________________ ?Gwen Her. Dianah Field, M.D., ABFM., CAQSM. ?Primary Care and Sports Medicine ?[place] ? ?Adjunct Instructor of Family Medicine  ?[employer] ?

## 2021-08-27 NOTE — Assessment & Plan Note (Addendum)
This is a pleasant 76 year old female, she has now had about 7 weeks of pain with several visits to both internal medicine, and myself, right anterior hip pain, worse with flexion of the hip, initially suspected to be hip flexor tendinitis, she did over 6 weeks of physician directed home physical therapy, oral medications, and x-rays were unrevealing, unfortunately since she has not improved we will need to do an MR arthrogram looking for hip labral tear or other pathology such as AVN. ?Adding triazolam for preprocedural anxiolysis, Kristen Jacobs understands she will need a driver. ?

## 2021-08-28 ENCOUNTER — Ambulatory Visit: Payer: Medicare Other | Admitting: Sports Medicine

## 2021-09-03 ENCOUNTER — Ambulatory Visit: Payer: Medicare Other | Admitting: Sports Medicine

## 2021-09-03 DIAGNOSIS — H524 Presbyopia: Secondary | ICD-10-CM | POA: Diagnosis not present

## 2021-09-03 DIAGNOSIS — H26493 Other secondary cataract, bilateral: Secondary | ICD-10-CM | POA: Diagnosis not present

## 2021-09-03 DIAGNOSIS — Z79899 Other long term (current) drug therapy: Secondary | ICD-10-CM | POA: Diagnosis not present

## 2021-09-03 DIAGNOSIS — Z961 Presence of intraocular lens: Secondary | ICD-10-CM | POA: Diagnosis not present

## 2021-09-10 ENCOUNTER — Ambulatory Visit (INDEPENDENT_AMBULATORY_CARE_PROVIDER_SITE_OTHER): Payer: Medicare Other | Admitting: Sports Medicine

## 2021-09-10 ENCOUNTER — Ambulatory Visit (INDEPENDENT_AMBULATORY_CARE_PROVIDER_SITE_OTHER): Payer: Medicare Other

## 2021-09-10 DIAGNOSIS — M25551 Pain in right hip: Secondary | ICD-10-CM

## 2021-09-10 DIAGNOSIS — G8929 Other chronic pain: Secondary | ICD-10-CM | POA: Diagnosis not present

## 2021-09-10 DIAGNOSIS — M76891 Other specified enthesopathies of right lower limb, excluding foot: Secondary | ICD-10-CM | POA: Diagnosis not present

## 2021-09-10 DIAGNOSIS — M1611 Unilateral primary osteoarthritis, right hip: Secondary | ICD-10-CM | POA: Diagnosis not present

## 2021-09-10 IMAGING — MR MR HIP*R* W/CM
5 of 6 series · 29 of 40 positions shown · IV contrast (agent unspecified)
Comparison: Hip radiograph [DATE]

CLINICAL DATA: MR arthrogram right hip, question labral tear

EXAM:
MRI OF THE RIGHT HIP WITH CONTRAST (MR Arthrogram)
TECHNIQUE: Multiplanar, multisequence MR imaging of the hip was performed
immediately following contrast injection into the hip joint under
fluoroscopic guidance. No intravenous contrast was administered.

[Series 4: T1 · coronal · 4.0mm · 0.66mm/px · 8 of 36 slices shown]
[im 1/36]
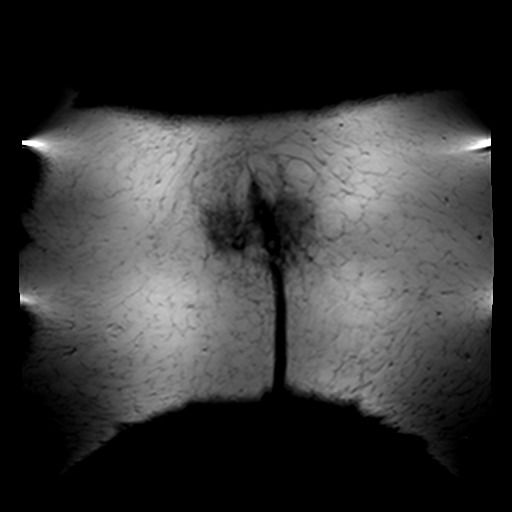
[im 6/36]
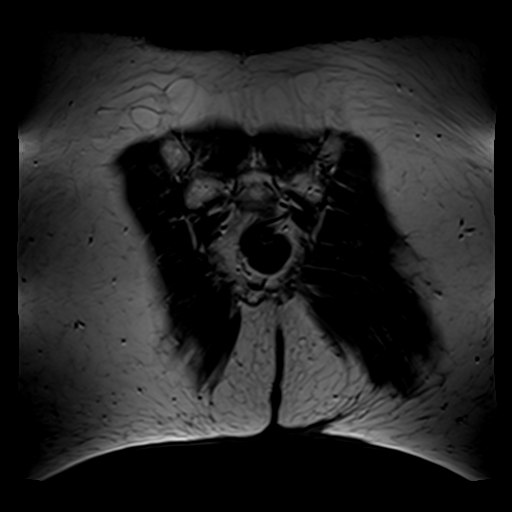
[im 11/36]
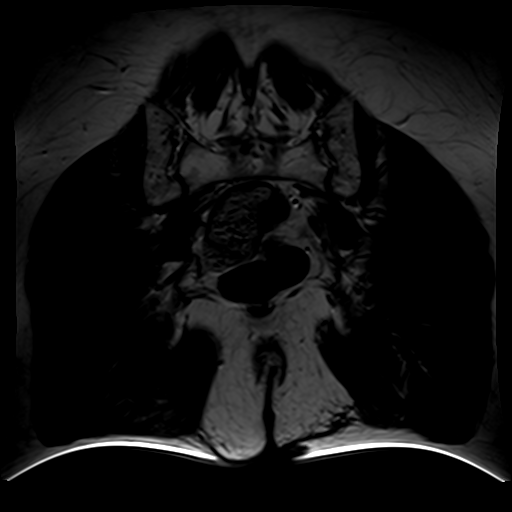
[im 16/36]
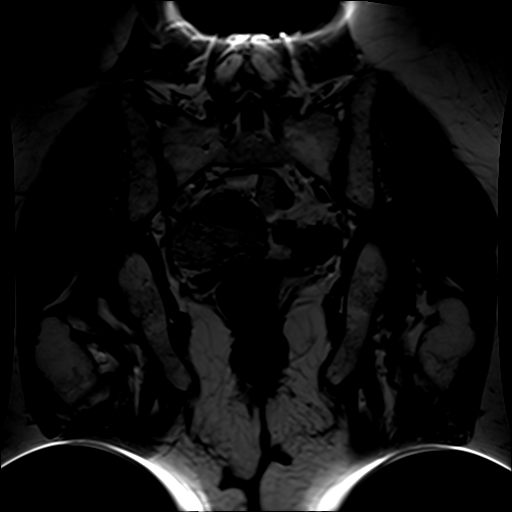
[im 21/36]
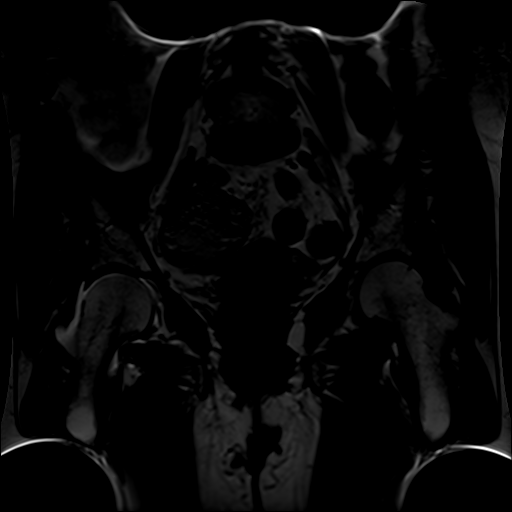
[im 26/36]
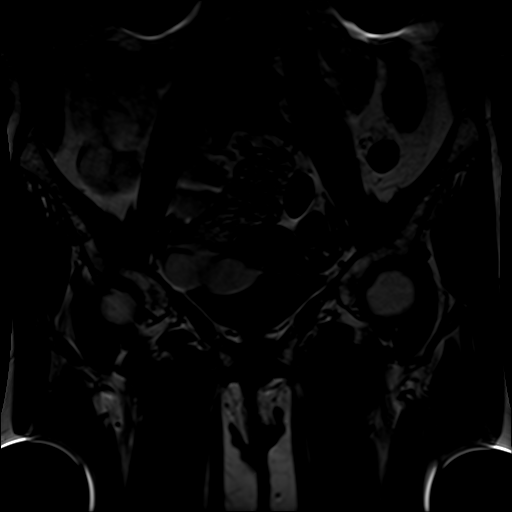
[im 31/36]
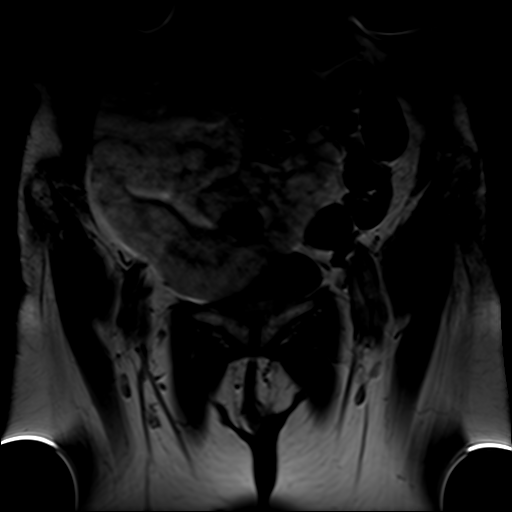
[im 36/36]
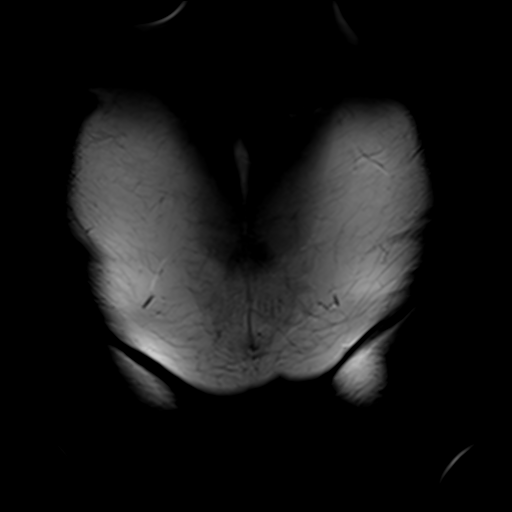

[Series 5: T2 fat-sat · axial · 3.0mm · 0.62mm/px · z∈[-103,+30]mm · 8 of 38 slices shown]
[im 1/38]
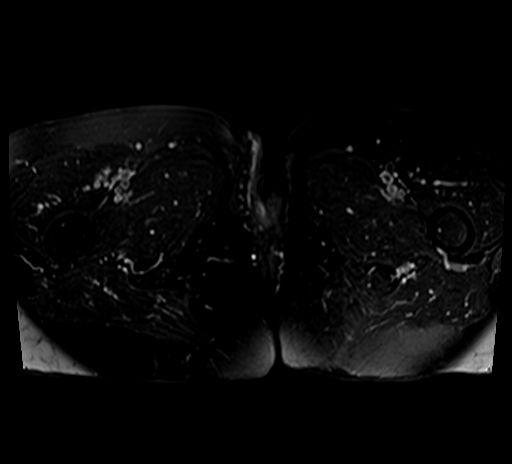
[im 6/38]
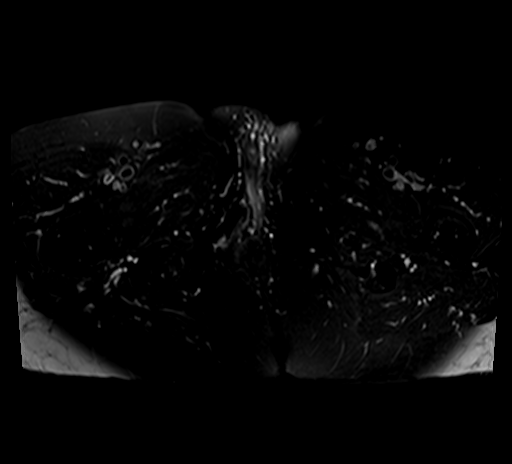
[im 11/38]
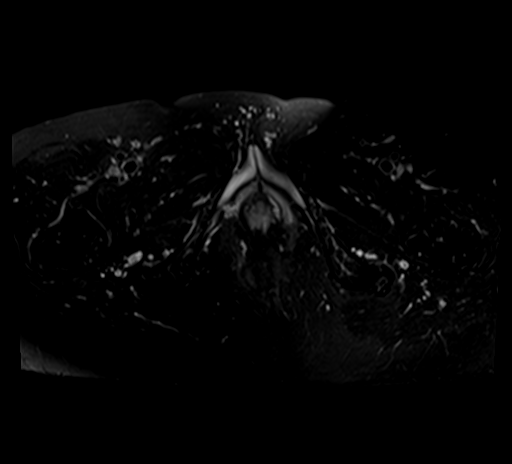
[im 16/38]
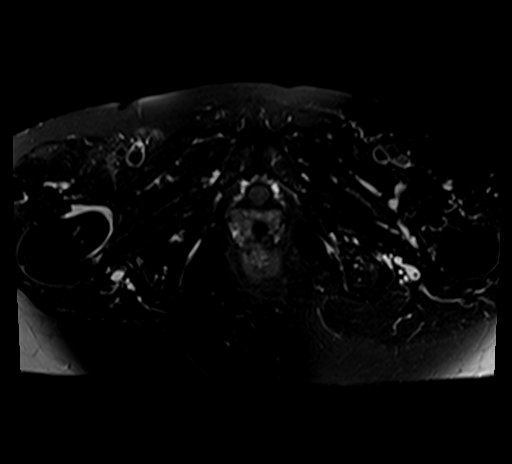
[im 22/38]
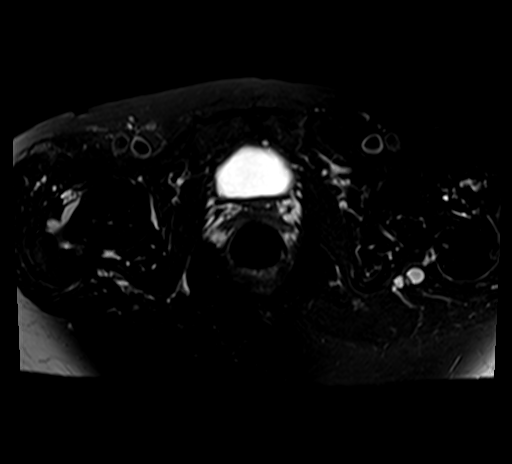
[im 27/38]
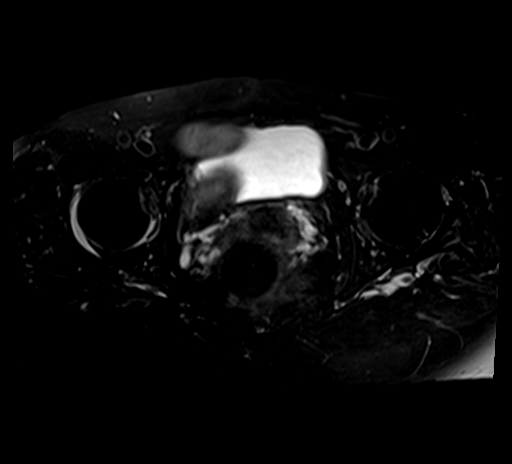
[im 32/38]
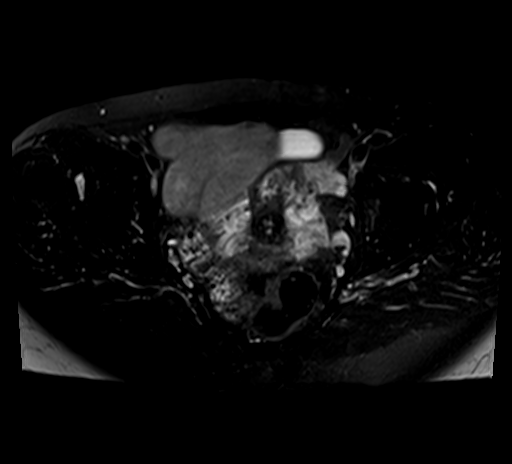
[im 38/38]
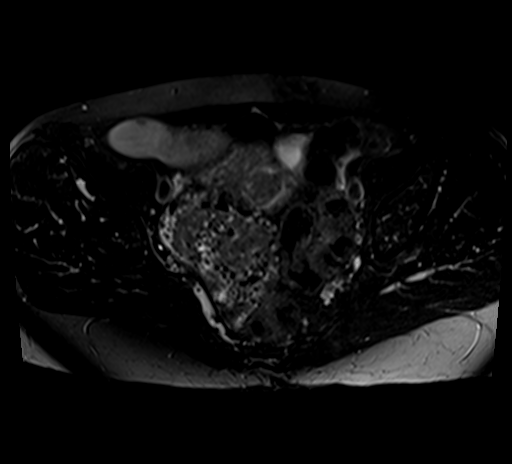

[Series 6: T1 fat-sat · axial · 4.0mm · 0.70mm/px · z∈[-131,-41]mm · 5 of 25 slices shown (1 of 3)]
[im 1/25]
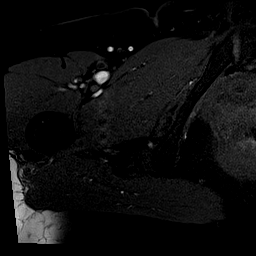
[im 7/25]
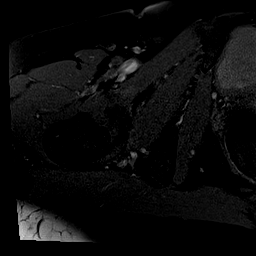
[im 13/25]
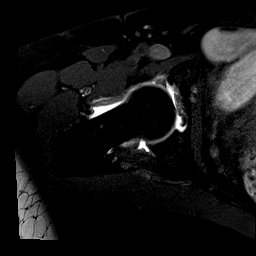
[im 19/25]
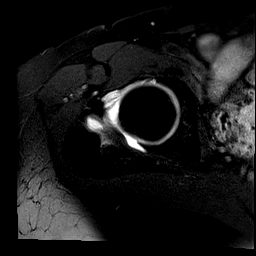
[im 25/25]
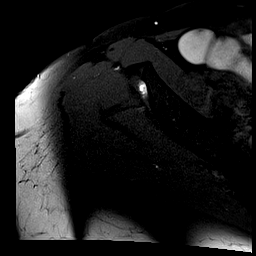

[Series 7: T1 fat-sat · coronal · 4.0mm · 0.70mm/px · 5 of 23 slices shown (2 of 3)]
[im 1/23]
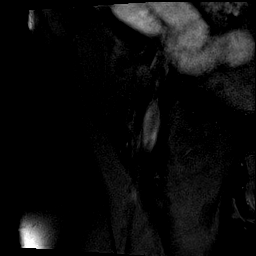
[im 6/23]
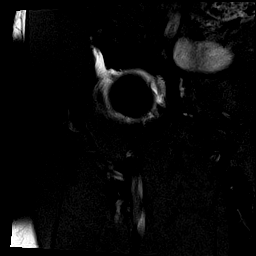
[im 12/23]
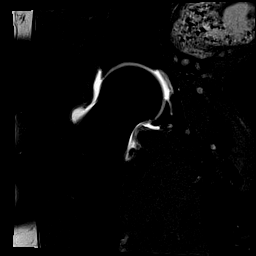
[im 17/23]
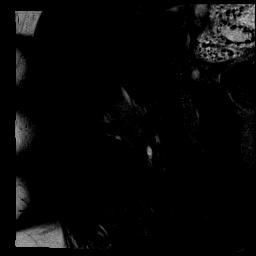
[im 23/23]
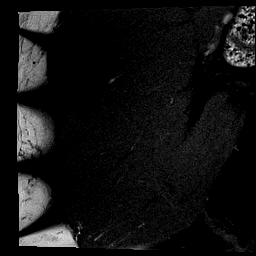

[Series 8: T1 fat-sat · sagittal · 4.0mm · 0.70mm/px · 3 of 27 slices shown (3 of 3)]
[im 1/27]
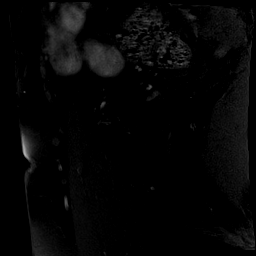
[im 6/27]
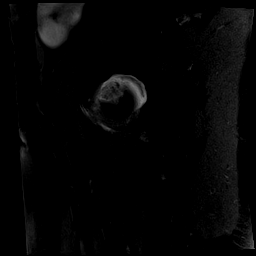
[im 11/27]
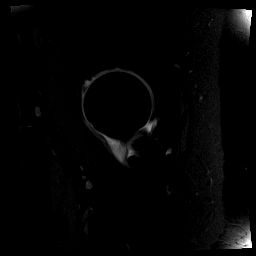

[29 of 40 positions shown; findings below may reference images not displayed]

FINDINGS: Bones: There is no evidence of acute fracture, dislocation or
avascular necrosis. No focal bone lesion. The visualized sacroiliac
joints are unremarkable. There are mild degenerative changes of the
symphysis pubis.

Articular cartilage and labrum

Articular cartilage:  Mild chondrosis.

Labrum: Degenerative intrasubstance tearing of the anterior superior
labrum and tear at the chondrolabral junction.

Joint or bursal effusion

Joint effusion: Adequate joint distension by arthrogram technique.

Bursae: No evidence of trochanteric bursitis.

Muscles and tendons

Muscles and tendons: The gluteal tendons are intact. The proximal
hamstrings are intact.The adductors are intact. No muscle atrophy of
edema.

Other findings

Miscellaneous: The visualized internal pelvic contents appear
unremarkable.
IMPRESSION: Mild right hip osteoarthritis. Degenerative anterior superior labral
tearing, intrasubstance and at the chondrolabral junction.

## 2021-09-10 MED ORDER — GADOBUTROL 1 MMOL/ML IV SOLN
1.0000 mL | Freq: Once | INTRAVENOUS | Status: AC | PRN
  Administered 2021-09-10: 1 mL via INTRAVENOUS

## 2021-09-10 NOTE — Assessment & Plan Note (Signed)
Please see prior notes, injection performed today for MR arthrography. ?

## 2021-09-10 NOTE — Progress Notes (Signed)
? ? ?  Procedures performed today:   ? ?Procedure: Real-time Ultrasound Guided gadolinium contrast injection of right hip joint ?Device: Samsung HS60  ?Verbal informed consent obtained.  ?Time-out conducted.  ?Noted no overlying erythema, induration, or other signs of local infection.  ?Skin prepped in a sterile fashion.  ?Local anesthesia: Topical Ethyl chloride.  ?With sterile technique and under real time ultrasound guidance: 22-gauge needle advanced to the femoral head/neck junction, contacted bone, normal-appearing joint, I then injected 1 cc kenalog 40, 2 cc lidocaine, 2 cc bupivacaine, syringe switched and 0.1 cc gallium injected, syringe again switched and 10 cc sterile saline used to fully distend the joint. ?Joint visualized and capsule seen distending confirming intra-articular placement of contrast material and medication. ?Completed without difficulty  ?Advised to call if fevers/chills, erythema, induration, drainage, or persistent bleeding.  ?Images permanently stored in PACS ?Impression: Technically successful ultrasound guided gadolinium contrast injection for MR arthrography.  Please see separate MR arthrogram report. ? ?Independent interpretation of notes and tests performed by another provider:  ? ?None. ? ?Brief History, Exam, Impression, and Recommendations:   ? ?Chronic right hip pain ?Please see prior notes, injection performed today for MR arthrography. ? ? ? ?___________________________________________ ?Gwen Her. Dianah Field, M.D., ABFM., CAQSM. ?Primary Care and Sports Medicine ?[place] ? ?Adjunct Instructor of Family Medicine  ?[employer] ?

## 2021-09-15 ENCOUNTER — Encounter (INDEPENDENT_AMBULATORY_CARE_PROVIDER_SITE_OTHER): Payer: Medicare Other | Admitting: Sports Medicine

## 2021-09-15 DIAGNOSIS — M4722 Other spondylosis with radiculopathy, cervical region: Secondary | ICD-10-CM | POA: Diagnosis not present

## 2021-09-15 DIAGNOSIS — M5412 Radiculopathy, cervical region: Secondary | ICD-10-CM

## 2021-09-15 DIAGNOSIS — G8929 Other chronic pain: Secondary | ICD-10-CM

## 2021-10-02 NOTE — Telephone Encounter (Signed)
I spent 5 total minutes of online digital evaluation and management services in this patient-initiated request for online care. 

## 2021-10-03 MED ORDER — TRAMADOL HCL 50 MG PO TABS: 50.0000 mg | ORAL_TABLET | Freq: Three times a day (TID) | ORAL | 3 refills | Status: DC | PRN

## 2021-10-03 NOTE — Addendum Note (Signed)
Addended by: Silverio Decamp on: 10/03/2021 10:01 AM   Modules accepted: Orders

## 2021-10-04 NOTE — Addendum Note (Signed)
Addended by: Silverio Decamp on: 10/04/2021 08:59 AM   Modules accepted: Orders

## 2021-10-10 DIAGNOSIS — N951 Menopausal and female climacteric states: Secondary | ICD-10-CM | POA: Diagnosis not present

## 2021-10-16 ENCOUNTER — Ambulatory Visit (INDEPENDENT_AMBULATORY_CARE_PROVIDER_SITE_OTHER): Payer: Medicare Other | Admitting: Orthopaedic Surgery

## 2021-10-16 VITALS — Ht 65.0 in | Wt 141.0 lb

## 2021-10-16 DIAGNOSIS — M25552 Pain in left hip: Secondary | ICD-10-CM

## 2021-10-16 NOTE — Progress Notes (Signed)
The patient is a very pleasant 76 year old female who is sent our way to evaluate and treat right hip pain.  Her pain is really minimal and it hurts with the extremes of activities and it did hurt during Pilates activities.  Plain films of her right hip were negative for any type of arthritic changes or acute findings.  I reviewed these x-rays with her and her husband and showed them the findings.  On x-ray both hips appear entirely normal with well-maintained joint space and no acute findings.  She ended up getting an MRI arthrogram of her right hip and it shows just some mild arthritic changes and a degenerative labral tear which is minimal.  I actually went over those films myself and showed them with her as well.  There is no hip joint effusion and no subchondral edema.  On exam her left hip and right hip moves smoothly and fluidly.  Her right more painful hip has just a touch of mild pain on the extremes of rotation.  Of note she has had an intra-articular steroid injection 2 weeks ago and that right hip at the same time where she had the contrast placed in the hip as well.  I talked in length in detail about avoiding Pilates and some of the other activities that are causing her to have pain in the right hip.  I would not recommend any type of surgical intervention because the degenerative changes of her labrum are minimal and the specialist who performed arthroscopic surgery on her hips usually do not perform this on mild degenerative tearing of the labrum with mild arthritis because this could worsen the arthritic findings.  There is not a flap tear that she would see in the repair in someone younger.  She does not need a hip replacement given that this is only mild arthritis and is more activity related.  I recommend that she avoid some of the activities that is really causing her the discomfort and try other exercise routines.  She agrees with this treatment plan as well.  All question concerns were  answered and addressed.

## 2021-10-29 DIAGNOSIS — L814 Other melanin hyperpigmentation: Secondary | ICD-10-CM | POA: Diagnosis not present

## 2021-10-29 DIAGNOSIS — L821 Other seborrheic keratosis: Secondary | ICD-10-CM | POA: Diagnosis not present

## 2021-10-29 DIAGNOSIS — Z85828 Personal history of other malignant neoplasm of skin: Secondary | ICD-10-CM | POA: Diagnosis not present

## 2021-10-29 DIAGNOSIS — L578 Other skin changes due to chronic exposure to nonionizing radiation: Secondary | ICD-10-CM | POA: Diagnosis not present

## 2021-10-29 DIAGNOSIS — L820 Inflamed seborrheic keratosis: Secondary | ICD-10-CM | POA: Diagnosis not present

## 2021-10-29 DIAGNOSIS — D485 Neoplasm of uncertain behavior of skin: Secondary | ICD-10-CM | POA: Diagnosis not present

## 2021-10-29 DIAGNOSIS — D225 Melanocytic nevi of trunk: Secondary | ICD-10-CM | POA: Diagnosis not present

## 2021-10-30 ENCOUNTER — Other Ambulatory Visit: Payer: Self-pay | Admitting: Physician Assistant

## 2021-10-30 NOTE — Telephone Encounter (Signed)
Next Visit: 01/21/2022  Last Visit: 08/21/2021  Labs: 08/21/2021, CBC and CMP is stable.  Eye exam: 09/03/2021   Current Dose per office note 08/21/2021: Plaquenil 200 mg 1 tablet by mouth twice daily Monday to Friday   [id number] arthritis involving multiple sites with positive rheumatoid factor   Last Fill: 06/13/2021  Okay to refill Plaquenil?

## 2021-10-31 ENCOUNTER — Ambulatory Visit (INDEPENDENT_AMBULATORY_CARE_PROVIDER_SITE_OTHER): Payer: Medicare Other | Admitting: Internal Medicine

## 2021-10-31 VITALS — BP 107/56 | HR 70 | Temp 98.1°F | Ht 66.5 in | Wt 138.5 lb

## 2021-10-31 DIAGNOSIS — Z8619 Personal history of other infectious and parasitic diseases: Secondary | ICD-10-CM

## 2021-10-31 DIAGNOSIS — G8929 Other chronic pain: Secondary | ICD-10-CM | POA: Diagnosis not present

## 2021-10-31 DIAGNOSIS — F419 Anxiety disorder, unspecified: Secondary | ICD-10-CM

## 2021-10-31 DIAGNOSIS — M25551 Pain in right hip: Secondary | ICD-10-CM

## 2021-10-31 DIAGNOSIS — G4700 Insomnia, unspecified: Secondary | ICD-10-CM | POA: Diagnosis not present

## 2021-10-31 DIAGNOSIS — F5101 Primary insomnia: Secondary | ICD-10-CM | POA: Diagnosis not present

## 2021-10-31 DIAGNOSIS — R499 Unspecified voice and resonance disorder: Secondary | ICD-10-CM | POA: Diagnosis not present

## 2021-10-31 DIAGNOSIS — F325 Major depressive disorder, single episode, in full remission: Secondary | ICD-10-CM | POA: Diagnosis not present

## 2021-10-31 DIAGNOSIS — F17210 Nicotine dependence, cigarettes, uncomplicated: Secondary | ICD-10-CM | POA: Diagnosis not present

## 2021-10-31 MED ORDER — TRAZODONE HCL 100 MG PO TABS: 100.0000 mg | ORAL_TABLET | Freq: Every day | ORAL | 3 refills | Status: DC

## 2021-10-31 MED ORDER — CITALOPRAM HYDROBROMIDE 20 MG PO TABS: 20.0000 mg | ORAL_TABLET | Freq: Every day | ORAL | 3 refills | Status: DC

## 2021-10-31 NOTE — Progress Notes (Signed)
Routine f/u for Kristen Jacobs, who has been experiencing R hip pain since our last visit, for which she was evaluated by my resident colleague.  Dissatisfied with that encounter (in part because an ordered xray didn't materialize - she wasn't contacted to schedule it) , she saw her sport medicine doctor Dr. Dianah Field.  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  Rec'd intraarticular steroid injection at that time.  Referred to orthopedist Dr. [name] who reviewed the images with her noting that the extremes of movement elicited pain and thus were made worse during certain Pilates activities, he advised conservative management, and she has since modified her exercise routine and has had much improved pain with a different Pilates class.   Concerns brought to today's visit:  L TMJ pain, clicks, no dislocation, clenches teeth at night.   Has been feeling a discomfort/abnormal sensation at her L neck Described as a fullness, also experiencing hoarseness.  Has a sensation of impaired air movement through the neck when lying on her side.     She shared that she is resuming bioidenticals for HRT, prescribed by her provider in [facility] who initially prescribed them years ago (for years) and is now offering televisits.    Projesterone 300, tri-estrogen 3.0, testosterone 1.6/DHEA 10 mg lozenge.  1/2 lozenge at bedtime.  Goal is increased energy.    She inquires about whether she needs a foot exam and expresses desire to complete a GYN exam.  Inquires about lung cancer screening given her history of smoking.  We had previously discussed obtaining a hep C viral load to ensure no residual virus following treatment, for her peace of mind.  Physical exam BP (!) 107/56 (BP Location: Right Arm, Patient Position: Sitting, Cuff Size: Small)   Pulse 70   Temp 98.1 F (36.7 C) (Oral)   Ht 5'  6.5" (1.689 m)   Wt 138 lb 8 oz (62.8 kg)   SpO2 100%   BMI 22.02 kg/m  Fit and youthful appearance, she is sporting a fabulous new haircut and looks amazing.  There is a very subtle but noticeable hoarseness to her voice compared to baseline.  Neck exam reveals symmetry in all planes, no palpable thyroid enlargement or nodularity, no palpable cervical lymphadenopathy, no masses in anterior, lateral or submandicular neck.  She does have tenderness to deep palpation of the normal deep structures of the neck just beneath the angle of the mandible, which she had pointed out as a concern (reassured these are normal structures).  No crepitus at R and L TMJs.  Oropharynx is symmetric, no abormalities.  Dentition (molar surfaces) appears healthy.  Heart RRR no murmur.  Lungs CTA, no stridor.  LE with good muscle tone.  Feet in excellent condition with healthy skin and nails, dp pulses 2+ bilat.  Walks w/o antalgic gait with respect to recent (improved) R hip pain.    Assessment and plan:  All medications reviewed and verified.   Plan routine GYN appt next visit. Referral to ENT. Screening chest CT ordered. She is giving more thought as to whether or not to continue care in a residency practice.   Next appt with rheum in 01/2022.  History of hepatitis C Hep C viral load ordered for reassurance of complete cure.  No symptoms.  Chronic right hip pain MRA R hip identified degenerative tearing of R hip labrum, though no flaps.  Referred to Dr. Rush Farmer, ortho, who advised conservative management as pain was expected to improve with modification of activities.  Pain has indeed improved considerably with changing of her Pilates exercises.    Hoarseness or changing voice Subacute change in voice, and sensation of fullness in left throat/neck, with sensation of impaired air movement when laying on her side. No perceived GERD symptoms.   Hx of tobacco use.  Referral to ENT for laryngoscopy.    Major  depression in remission ([name]) Remission continues under treatment with citalopram 20 mg daily which is refilled.

## 2021-11-01 LAB — HCV RNA QUANT: Hepatitis C Quantitation: NOT DETECTED IU/mL

## 2021-11-11 NOTE — Assessment & Plan Note (Signed)
Remission continues under treatment with citalopram 20 mg daily which is refilled.

## 2021-11-11 NOTE — Assessment & Plan Note (Signed)
Subacute change in voice, and sensation of fullness in left throat/neck, with sensation of impaired air movement when laying on her side. No perceived GERD symptoms.   Hx of tobacco use.  Referral to ENT for laryngoscopy.

## 2021-11-11 NOTE — Assessment & Plan Note (Signed)
Hep C viral load ordered for reassurance of complete cure.  No symptoms.

## 2021-11-11 NOTE — Assessment & Plan Note (Signed)
>>  ASSESSMENT AND PLAN FOR HOARSENESS OR CHANGING VOICE WRITTEN ON 11/11/2021 11:40 AM BY Angelica Pou, MD  Subacute change in voice, and sensation of fullness in left throat/neck, with sensation of impaired air movement when laying on her side. No perceived GERD symptoms.   Hx of tobacco use.  Referral to ENT for laryngoscopy.

## 2021-11-11 NOTE — Assessment & Plan Note (Signed)
MRA R hip identified degenerative tearing of R hip labrum, though no flaps.  Referred to Dr. Rush Farmer, ortho, who advised conservative management as pain was expected to improve with modification of activities.  Pain has indeed improved considerably with changing of her Pilates exercises.

## 2021-11-25 ENCOUNTER — Ambulatory Visit (HOSPITAL_COMMUNITY)
Admission: RE | Admit: 2021-11-25 | Discharge: 2021-11-25 | Disposition: A | Discharge status: Home / Self Care | Payer: Medicare Other | Source: Ambulatory Visit | Attending: Internal Medicine | Admitting: Internal Medicine

## 2021-11-25 DIAGNOSIS — Z87891 Personal history of nicotine dependence: Secondary | ICD-10-CM | POA: Diagnosis not present

## 2021-11-25 DIAGNOSIS — F17210 Nicotine dependence, cigarettes, uncomplicated: Secondary | ICD-10-CM

## 2021-12-02 ENCOUNTER — Telehealth: Payer: Self-pay

## 2021-12-02 NOTE — Telephone Encounter (Signed)
Pt is calling in regards to not being able to reach her provider through my chart .. I placed her through to the main and IT  who told her it is  on Dr Jimmye Norman end and the Dr  needs to contact them  inorder  to  get messages from pt

## 2021-12-02 NOTE — Telephone Encounter (Signed)
Pt is requesting  a call back .Marland Kitchen She is wanting someone to explain her test results that came through on my chart

## 2021-12-04 NOTE — Telephone Encounter (Signed)
I will contact IT to determine next steps.  In the meantime, I called her to review the results of her chest CT.  Questions answered to her satisfaction.

## 2021-12-10 ENCOUNTER — Other Ambulatory Visit (HOSPITAL_COMMUNITY): Payer: Self-pay | Admitting: Otolaryngology

## 2021-12-10 ENCOUNTER — Other Ambulatory Visit: Payer: Self-pay | Admitting: Otolaryngology

## 2021-12-10 DIAGNOSIS — K219 Gastro-esophageal reflux disease without esophagitis: Secondary | ICD-10-CM | POA: Diagnosis not present

## 2021-12-10 DIAGNOSIS — J300 Vasomotor rhinitis: Secondary | ICD-10-CM

## 2021-12-10 DIAGNOSIS — E041 Nontoxic single thyroid nodule: Secondary | ICD-10-CM | POA: Insufficient documentation

## 2021-12-10 HISTORY — DX: Vasomotor rhinitis: J30.0

## 2021-12-18 ENCOUNTER — Ambulatory Visit (HOSPITAL_COMMUNITY)
Admission: RE | Admit: 2021-12-18 | Discharge: 2021-12-18 | Disposition: A | Discharge status: Home / Self Care | Payer: Medicare Other | Source: Ambulatory Visit | Attending: Otolaryngology | Admitting: Otolaryngology

## 2021-12-18 DIAGNOSIS — E041 Nontoxic single thyroid nodule: Secondary | ICD-10-CM | POA: Diagnosis not present

## 2022-01-07 NOTE — Progress Notes (Unsigned)
Office Visit Note  Patient: Kristen Jacobs             Date of Birth: [birth year]           MRN: [id number]             PCP: Angelica Pou, MD Referring: Angelica Pou, MD Visit Date: 01/21/2022 Occupation: '@GUAROCC'$ @  Subjective:  Medication monitoring  History of Present Illness: Mishal Probert is a 76 y.o. female with history of seropositive rheumatoid arthritis, osteoarthritis, and DDD. She is taking Plaquenil 200 mg 1 tablet by mouth twice daily Monday to Friday start date March 08, 2020.  She continues to tolerate Plaquenil without any side effects and has not missed any doses recently.  She denies any signs or symptoms of a rheumatoid arthritis flare.  She states her morning stiffness has been lasting a few minutes on a daily basis.  She denies any nocturnal pain.  She has not any difficulty with ADLs.  She has been going to Pilates twice a week for exercise.  Patient reports that she will be on hormone replacement therapy for 4 months.    Activities of Daily Living:  Patient reports morning stiffness for a few minutes.   Patient Denies nocturnal pain.  Difficulty dressing/grooming: Denies Difficulty climbing stairs: Denies Difficulty getting out of chair: Reports Difficulty using hands for taps, buttons, cutlery, and/or writing: Reports  Review of Systems  Constitutional:  Positive for fatigue.  HENT:  Negative for mouth sores and mouth dryness.   Eyes:  Positive for dryness.  Respiratory:  Negative for shortness of breath.   Cardiovascular:  Negative for chest pain and palpitations.  Gastrointestinal:  Negative for blood in stool, constipation and diarrhea.  Endocrine: Negative for increased urination.  Genitourinary:  Negative for involuntary urination.  Musculoskeletal:  Positive for joint pain, joint pain, joint swelling, myalgias, muscle weakness, morning stiffness, muscle tenderness and myalgias. Negative for gait problem.  Skin:  Negative for color  change, rash, hair loss and sensitivity to sunlight.  Allergic/Immunologic: Negative for susceptible to infections.  Neurological:  Negative for dizziness and headaches.  Hematological:  Negative for swollen glands.  Psychiatric/Behavioral:  Negative for depressed mood and sleep disturbance. The patient is not nervous/anxious.     PMFS History:  Patient Active Problem List   Diagnosis Date Noted   Hoarseness or changing voice 10/31/2021   Insomnia disorder 10/31/2021   Fear of vaccinations 02/22/2021   History of malignant neoplasm of skin 11/22/2020   Age-related osteoporosis without fracture 11/22/2020   Major depression in remission ([place]) 11/22/2020   History of hepatitis C 04/05/2020   Cervical spondylosis with radiculopathy 02/21/2020   Rheumatoid factor positive 02/17/2020   Laryngopharyngeal reflux (LPR) 12/20/2019   Avulsion fracture of left talus 09/05/2019   Primary osteoarthritis of left knee 07/19/2019   Subacromial bursitis of left shoulder joint 07/19/2019   Bilateral wrist pain, 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 05/24/2019   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  Urine POC dipstick normal today.    Joint pain     Family History  Problem Relation Age of Onset  Dementia Mother    Cancer Father    Bipolar disorder Sister    Leukemia Brother    Healthy Son    Asthma Son    Healthy Son    Past Surgical History:  Procedure Laterality Date   BREAST BIOPSY Bilateral    benign   CATARACT EXTRACTION Right 03/2020   LAPAROSCOPY     LIVER BIOPSY     OOPHORECTOMY     right    TONSILLECTOMY     age 66     Social History   Social History Narrative   From [place], first Scientist, research (life sciences), family is [place]  Moved to [facility] in late 2010's.  Enjoys healthful living, Mediterranean diet, staying active.  First husband passed away due to complications from diabetes.  She has since remarried.   Immunization History  Administered Date(s) Administered   [employer] Vaccination 11/17/2019, 12/05/2019, 01/02/2020     Objective: Vital Signs: BP (!) 94/55 (BP Location: Left Arm, Patient Position: Sitting, Cuff Size: Normal)   Pulse (!) 58   Resp 15   Ht 5' 5.5" (1.664 m)   Wt 135 lb 3.2 oz (61.3 kg)   BMI 22.16 kg/m    Physical Exam Vitals and nursing note reviewed.  Constitutional:      Appearance: She is well-developed.  HENT:     Head: Normocephalic and atraumatic.  Eyes:     Conjunctiva/sclera: Conjunctivae normal.  Cardiovascular:     Rate and Rhythm: Normal rate and regular rhythm.     Heart sounds: Normal heart sounds.  Pulmonary:     Effort: Pulmonary effort is normal.     Breath sounds: Normal breath sounds.  Abdominal:     General: Bowel sounds are normal.     Palpations: Abdomen is soft.  Musculoskeletal:     Cervical back: Normal range of motion.  Skin:    General: Skin is warm and dry.     Capillary Refill: Capillary refill takes less than 2 seconds.  Neurological:     Mental Status: She is alert and oriented to person, place, and time.  Psychiatric:        Behavior: Behavior normal.      Musculoskeletal Exam: C-spine, thoracic spine, lumbar spine have good range of motion.  No midline spinal tenderness or SI joint tenderness.  Shoulder joints, elbow joints, wrist joints, MCPs, PIPs, DIPs have good range of motion with no synovitis.  Complete fist formation bilaterally.  Hip joints have good range of motion with no groin pain.  No tenderness over trochanteric bursa bilaterally.  Knee joints have good range of motion with no warmth or  effusion.  Ankle joints have good range of motion with no tenderness or joint swelling.  Prominence over the base of bilateral fifth metatarsals.  Tailor bunions noted bilaterally.  Some PIP and DIP thickening consistent with osteoarthritis of both feet.  No tenderness or synovitis over MTP joints.  CDAI Exam: CDAI Score: 0.4  Patient Global: 2 mm; Provider Global: 2 mm Swollen: 0 ; Tender: 0  Joint Exam 01/21/2022   No joint exam has been documented for this visit   There is currently no information documented on the homunculus. Go to the Rheumatology activity and complete the homunculus joint exam.  Investigation: No additional findings.  Imaging: No results found.  Recent Labs: Lab Results  Component Value Date   WBC 6.4 08/21/2021   HGB 13.9 08/21/2021   PLT 251 08/21/2021   NA 135 08/21/2021  K 4.5 08/21/2021   CL 101 08/21/2021   CO2 26 08/21/2021   GLUCOSE 77 08/21/2021   BUN 18 08/21/2021   CREATININE 0.98 08/21/2021   BILITOT 0.5 08/21/2021   ALKPHOS 87 01/24/2021   AST 20 08/21/2021   ALT 12 08/21/2021   PROT 6.0 (L) 08/21/2021   ALBUMIN 4.2 01/24/2021   CALCIUM 9.7 08/21/2021   GFRAA 67 10/25/2020    Speciality Comments: PLQ eye exam WNL 09/03/2021 [facility] f/u 12 months  Procedures:  No procedures performed Allergies: Other and Sulfa antibiotics    Assessment / Plan:     Visit Diagnoses: Rheumatoid arthritis involving multiple sites with positive rheumatoid factor (HCC) - MRI of the wrist joint showed synovitis, tenosynovitis and possible erosions: She has no joint tenderness or synovitis on examination today.  She has not had any signs or symptoms of a rheumatoid arthritis flare recently.  She has clinically been doing well taking Plaquenil 200 mg 1 tablet by mouth twice daily Monday through Friday.  She continues to tolerate Plaquenil without any side effects and has not missed any doses recently.  She has occasional arthralgias but no joint  swelling.  Her morning stiffness has only been lasting a few minutes daily.  She has not had any nocturnal pain.  She has been going to Pilates twice a week and plans on starting to practice yoga more frequently. She will remain on Plaquenil as monotherapy.  She was advised to notify us if she develops increased joint pain or joint swelling.  She will follow-up in the office in 5 months or sooner if needed.  High risk medication use - Plaquenil 200 mg 1 tablet by mouth twice daily Monday to Friday start date March 08, 2020.   - Plan: CBC with Differential/Platelet, COMPLETE METABOLIC PANEL WITH GFR CBC and CMP drawn on 08/21/2021.  Orders for CBC and CMP were released today.  She will continue to require updated lab work every 5 months to monitor for drug toxicity. PLQ eye exam WNL 09/03/2021 [facility] f/u 12 months.   Pain in both hands - X-ray of bilateral hands showed cystic versus erosive changes in the carpals. No synovitis noted on examination today.   Lateral epicondylitis, left elbow: Resolved.   Primary osteoarthritis of left knee: She has good ROM of the left knee with no discomfort.  No warmth or effusion noted.   Primary osteoarthritis of both feet: Ankle joints have good ROM with no tenderness or joint swelling. PIP and DIP thickening consistent with osteoarthritis of both feet.  Tailor bunions noted bilaterally.  Prominence over the base of bilateral 5th metatarsals.   DDD (degenerative disc disease), cervical - Patient is followed by Dr. Arnoldo Morale.  She has tried physical therapy in the past.   She has good ROM with some mild stiffness and discomfort at times.  No symptoms of radiculopathy.   Other medical conditions are listed as follows:  Age-related osteoporosis without fracture: Followed by PCP.  Patient does not plan on having an updated bone density.  She plans on continuing to take over-the-counter calcium vitamin D supplements as well as performing resistive  exercises.  Closed nondisplaced avulsion fracture of left talus with routine healing, subsequent encounter  Anxiety  Primary insomnia  Other fatigue  History of hepatitis C - Treated with Harvoni.  Orders: Orders Placed This Encounter  Procedures   CBC with Differential/Platelet   COMPLETE METABOLIC PANEL WITH GFR   No orders of the defined types were  placed in this encounter.  Follow-Up Instructions: Return in about 5 months (around 06/23/2022) for Rheumatoid arthritis, Osteoarthritis, DDD.   Ofilia Neas, PA-C  Note - This record has been created using Dragon software.  Chart creation errors have been sought, but may not always  have been located. Such creation errors do not reflect on  the standard of medical care.

## 2022-01-21 ENCOUNTER — Encounter: Payer: Self-pay | Admitting: Physician Assistant

## 2022-01-21 ENCOUNTER — Ambulatory Visit: Payer: Medicare Other | Attending: Physician Assistant | Admitting: Physician Assistant

## 2022-01-21 VITALS — BP 94/55 | HR 58 | Resp 15 | Ht 65.5 in | Wt 135.2 lb

## 2022-01-21 DIAGNOSIS — S92155D Nondisplaced avulsion fracture (chip fracture) of left talus, subsequent encounter for fracture with routine healing: Secondary | ICD-10-CM | POA: Insufficient documentation

## 2022-01-21 DIAGNOSIS — F419 Anxiety disorder, unspecified: Secondary | ICD-10-CM | POA: Diagnosis not present

## 2022-01-21 DIAGNOSIS — M5030 Other cervical disc degeneration, unspecified cervical region: Secondary | ICD-10-CM | POA: Diagnosis not present

## 2022-01-21 DIAGNOSIS — F5101 Primary insomnia: Secondary | ICD-10-CM | POA: Insufficient documentation

## 2022-01-21 DIAGNOSIS — M79641 Pain in right hand: Secondary | ICD-10-CM | POA: Insufficient documentation

## 2022-01-21 DIAGNOSIS — Z8619 Personal history of other infectious and parasitic diseases: Secondary | ICD-10-CM | POA: Insufficient documentation

## 2022-01-21 DIAGNOSIS — M1712 Unilateral primary osteoarthritis, left knee: Secondary | ICD-10-CM | POA: Diagnosis not present

## 2022-01-21 DIAGNOSIS — M79642 Pain in left hand: Secondary | ICD-10-CM | POA: Diagnosis not present

## 2022-01-21 DIAGNOSIS — Z79899 Other long term (current) drug therapy: Secondary | ICD-10-CM | POA: Insufficient documentation

## 2022-01-21 DIAGNOSIS — M19072 Primary osteoarthritis, left ankle and foot: Secondary | ICD-10-CM | POA: Insufficient documentation

## 2022-01-21 DIAGNOSIS — M19071 Primary osteoarthritis, right ankle and foot: Secondary | ICD-10-CM | POA: Diagnosis not present

## 2022-01-21 DIAGNOSIS — M0579 Rheumatoid arthritis with rheumatoid factor of multiple sites without organ or systems involvement: Secondary | ICD-10-CM | POA: Insufficient documentation

## 2022-01-21 DIAGNOSIS — M7712 Lateral epicondylitis, left elbow: Secondary | ICD-10-CM | POA: Diagnosis not present

## 2022-01-21 DIAGNOSIS — R5383 Other fatigue: Secondary | ICD-10-CM | POA: Diagnosis not present

## 2022-01-21 DIAGNOSIS — M818 Other osteoporosis without current pathological fracture: Secondary | ICD-10-CM | POA: Insufficient documentation

## 2022-01-22 LAB — CBC WITH DIFFERENTIAL/PLATELET
Absolute Monocytes: 546 cells/uL (ref 200–950)
Basophils Absolute: 52 cells/uL (ref 0–200)
Basophils Relative: 0.8 %
Eosinophils Absolute: 163 cells/uL (ref 15–500)
Eosinophils Relative: 2.5 %
HCT: 43.3 % (ref 35.0–45.0)
Hemoglobin: 14.1 g/dL (ref 11.7–15.5)
Lymphs Abs: 1209 cells/uL (ref 850–3900)
MCH: 30.1 pg (ref 27.0–33.0)
MCHC: 32.6 g/dL (ref 32.0–36.0)
MCV: 92.5 fL (ref 80.0–100.0)
MPV: 11.2 fL (ref 7.5–12.5)
Monocytes Relative: 8.4 %
Neutro Abs: 4531 cells/uL (ref 1500–7800)
Neutrophils Relative %: 69.7 %
Platelets: 231 10*3/uL (ref 140–400)
RBC: 4.68 10*6/uL (ref 3.80–5.10)
RDW: 11.8 % (ref 11.0–15.0)
Total Lymphocyte: 18.6 %
WBC: 6.5 10*3/uL (ref 3.8–10.8)

## 2022-01-22 LAB — COMPLETE METABOLIC PANEL WITH GFR
AG Ratio: 2 (calc) (ref 1.0–2.5)
ALT: 10 U/L (ref 6–29)
AST: 18 U/L (ref 10–35)
Albumin: 3.9 g/dL (ref 3.6–5.1)
Alkaline phosphatase (APISO): 58 U/L (ref 37–153)
BUN: 16 mg/dL (ref 7–25)
CO2: 23 mmol/L (ref 20–32)
Calcium: 9.4 mg/dL (ref 8.6–10.4)
Chloride: 106 mmol/L (ref 98–110)
Creat: 0.97 mg/dL (ref 0.60–1.00)
Globulin: 2 g/dL (calc) (ref 1.9–3.7)
Glucose, Bld: 73 mg/dL (ref 65–99)
Potassium: 5.1 mmol/L (ref 3.5–5.3)
Sodium: 137 mmol/L (ref 135–146)
Total Bilirubin: 0.5 mg/dL (ref 0.2–1.2)
Total Protein: 5.9 g/dL — ABNORMAL LOW (ref 6.1–8.1)
eGFR: 60 mL/min/{1.73_m2} (ref >=60)

## 2022-01-22 NOTE — Progress Notes (Signed)
CBC WNL.   Total protein is borderline low-stable. Rest of CMP WNL.   No change in therapy at this time.

## 2022-01-28 ENCOUNTER — Telehealth: Payer: Self-pay | Admitting: *Deleted

## 2022-01-28 NOTE — Telephone Encounter (Addendum)
Patient called in stating she has tested positive for Covid. She is requesting tele appt to discuss need for paxlovid. Patient is currently in [place]. Explained that Providers are not able to prescribe meds across state lines. Patient was told this twice earlier today by [employer]. Encouraged patient to go to nearest UC/ED in [place]. States they won't take her. States she plans to get on plane tomorrow back to [place], even though she is positive for Covid. There are no openings at this time in [facility]. She was given tele appt with PCP for 9/28. Patient is very displeased with this and does not understand why someone can't help her now.

## 2022-01-30 ENCOUNTER — Ambulatory Visit (INDEPENDENT_AMBULATORY_CARE_PROVIDER_SITE_OTHER): Payer: Medicare Other | Admitting: Internal Medicine

## 2022-01-30 DIAGNOSIS — U071 COVID-19: Secondary | ICD-10-CM

## 2022-01-30 HISTORY — DX: COVID-19: U07.1

## 2022-01-30 NOTE — Progress Notes (Signed)
I connected with  Kristen Jacobs on 01/30/22 by telephone and verified that I am speaking with the correct person using two identifiers.   I discussed the limitations of evaluation and management by telemedicine. The patient expressed understanding and agreed to proceed.  Kristen Jacobs spoke to me by phone from her home and I to her from the [facility].    COVID-19 virus infection Kristen Jacobs returned to her home yesterday from [place] by plane, following development about two days prior of cold symptoms "it feels like the flu". She is experiencing mild symptoms, no difficulty breathing, and no measured fever.  Feels achey and has a sore throat.  When she became ill and took a Covid test in [place], she attempted to access care/advice and was declined due to being out of state for some reason.  We discussed the possible role of Paxlovid begun within 5 days of symptom onset, which can help reduce the duration and intensity of symptoms.  We discussed the potential interaction with her current daily medications, and there is a minor interaction with her hydroxychloroquine, whereby the blood level of such would be increased in the presence of Paxlovid.  We discussed the positive trajectory of her improvement and that Paxlovid would not likely provide noticeable results.  OTC pain relievers, and warm tea with lemon, as well as gargling with warm salt water are recommended.    She is advised to seek medical attention if she develops shortness of breath or if her symptoms worsen rather than improve.

## 2022-02-03 NOTE — Assessment & Plan Note (Signed)
Ms. Mccarrick returned to her home yesterday from [place] by plane, following development about two days prior of cold symptoms "it feels like the flu". She is experiencing mild symptoms, no difficulty breathing, and no measured fever.  Feels achey and has a sore throat.  When she became ill and took a Covid test in [place], she attempted to access care/advice and was declined due to being out of state for some reason.  We discussed the possible role of Paxlovid begun within 5 days of symptom onset, which can help reduce the duration and intensity of symptoms.  We discussed the potential interaction with her current daily medications, and there is a minor interaction with her hydroxychloroquine, whereby the blood level of such would be increased in the presence of Paxlovid.  We discussed the positive trajectory of her improvement and that Paxlovid would not likely provide noticeable results.  OTC pain relievers, and warm tea with lemon, as well as gargling with warm salt water are recommended.

## 2022-02-13 ENCOUNTER — Other Ambulatory Visit: Payer: Self-pay | Admitting: Internal Medicine

## 2022-02-13 DIAGNOSIS — Z1231 Encounter for screening mammogram for malignant neoplasm of breast: Secondary | ICD-10-CM

## 2022-03-19 ENCOUNTER — Other Ambulatory Visit: Payer: Self-pay | Admitting: Rheumatology

## 2022-03-19 NOTE — Telephone Encounter (Signed)
Next Visit: 07/01/2022  Last Visit: 01/21/2022  Labs: 01/21/2022 CBC WNL.   Total protein is borderline low-stable. Rest of CMP WNL.   No change in therapy at this time.  Eye exam: 09/03/2021   Current Dose per office note 01/21/2022: Plaquenil 200 mg 1 tablet by mouth twice daily Monday to Friday   WB:[id number] arthritis involving multiple sites with positive rheumatoid factor   Last Fill: 10/30/2021  Okay to refill Plaquenil?

## 2022-03-25 ENCOUNTER — Ambulatory Visit: Payer: Medicare Other

## 2022-04-04 DIAGNOSIS — N951 Menopausal and female climacteric states: Secondary | ICD-10-CM | POA: Diagnosis not present

## 2022-04-15 ENCOUNTER — Other Ambulatory Visit: Payer: Self-pay | Admitting: Sports Medicine

## 2022-04-15 DIAGNOSIS — M5412 Radiculopathy, cervical region: Secondary | ICD-10-CM

## 2022-05-07 NOTE — Progress Notes (Unsigned)
Kristen Jacobs is here for routine visit though primarily to complete a GYN examination.  History of an abnormal Pap years ago.  No vaginal symptoms of dryness, pruritus, or irritation.  At last visit, she was experiencing hoarseness and was referred to ENT, who concluded that she may have been experiencing worsening reflux due to her daily coffee consumption (she has not reduced quantity subsequently and on occasion continues to have hoarseness).  At last visit she had reported resumption of bioidentical hormones for energy prescribed by a provider in [place].   Experienced Covid, mild, in 01/2022.  Routine visit with [facility] 01/2022, no active inflammation, continue plaquenil M-F, annual eye exams, blood tests to monitor plaq every 5-6 m.  Kristen Jacobs voiced her frustration with the state of traditional American medicine which she feels is so heavily focused on medications, and on management instead of cures.  She is hoping to identify an integrative medicine provider who would better align with her beliefs.  The recent diagnosis of two of her friends with stage 4 pancreatic cancer has understandably been very upsetting; particularly as these individuals didn't have symptoms.  We discussed the unfortunate fact that there are no screening tests for pancreatic or ovarian (or many other) cancers.  She fears being diagnosed with a late stage malignancy.  Reminds me that she hasn't had a call to schedule routine colonoscopy.   Exam: BP 112/65 (BP Location: Left Arm, Patient Position: Sitting, Cuff Size: Small)   Pulse 67   Ht 5' 5.5" (1.664 m)   Wt 135 lb 9.6 oz (61.5 kg)   SpO2 100%   BMI 22.22 kg/m .  A 10# weight loss over the year is attributed to her removing her boots and coat for weight today.  She has not noticed weight loss and has normal appetite. GYN:  normal external genitalia.  Vulvar surface is smooth and dry, no signs of lichen sclerosis.  Nml aperture introitus and vaginal surfaces with moist  mucosa.  No abnormal discharge.  PAP sample obtained.    Assessment and plan: We had a good visit today.  I have a better understanding of Kristen Jacobs's thoughts and beliefs about medical care.  While she continues her search for an integrative provider (I provided name and contact for a possibility in [place]), we will proceed with with available cancer screening including mammography (scheduled 05/21/22), lung cancer screening CT (completed 11/2021) cervical (Pap pending), and colonoscopy (place referral).  Holding off on DEXA for now. She will make a f/u in about 6 months, or cancel further care if she is successful in identifying another practice.  Laryngopharyngeal reflux (LPR) Referral to ENT Dr. Constance Holster summer 2023.  Dx suspected laryngopharyngeal reflux due to quantity of coffee consumption.  Indirect laryngoscopy normal.  Incidental finding on physical examination thyroid nodule which has been evaluated by ultrasound, benign appearance, listed elsewhere.  Thyroid nodule Incidental finding on examination with Dr. Constance Holster, ENT, where she was evaluated for hoarseness.  Ultrasound shows a few small benign-appearing nodules, none of which would require a biopsy, annual ultrasound surveillance advised.  Age-related osteoporosis without fracture Briefly touched on the role of vitamin D in bone health and reviewed that she takes daily supplement and eats a very healthy diet.  No plan at this time for DEXA, though she may be open to it in the future.  Her hesitancy is that she wishes to avoid bisphosphonates so any results of the DEXA may not contribute to a change in plan.  It would be helpful just for information to better understand the status of her bone health.

## 2022-05-08 ENCOUNTER — Ambulatory Visit (INDEPENDENT_AMBULATORY_CARE_PROVIDER_SITE_OTHER): Payer: Medicare Other | Admitting: Internal Medicine

## 2022-05-08 ENCOUNTER — Encounter: Payer: Self-pay | Admitting: Internal Medicine

## 2022-05-08 ENCOUNTER — Other Ambulatory Visit (HOSPITAL_COMMUNITY)
Admission: RE | Admit: 2022-05-08 | Discharge: 2022-05-08 | Disposition: A | Discharge status: Home / Self Care | Payer: Medicare Other | Source: Ambulatory Visit | Attending: Internal Medicine | Admitting: Internal Medicine

## 2022-05-08 VITALS — BP 112/65 | HR 67 | Ht 65.5 in | Wt 135.6 lb

## 2022-05-08 DIAGNOSIS — M818 Other osteoporosis without current pathological fracture: Secondary | ICD-10-CM

## 2022-05-08 DIAGNOSIS — E041 Nontoxic single thyroid nodule: Secondary | ICD-10-CM | POA: Diagnosis not present

## 2022-05-08 DIAGNOSIS — Z124 Encounter for screening for malignant neoplasm of cervix: Secondary | ICD-10-CM

## 2022-05-08 DIAGNOSIS — K219 Gastro-esophageal reflux disease without esophagitis: Secondary | ICD-10-CM

## 2022-05-08 DIAGNOSIS — Z1211 Encounter for screening for malignant neoplasm of colon: Secondary | ICD-10-CM

## 2022-05-08 NOTE — Assessment & Plan Note (Signed)
Referral to ENT Dr. Constance Holster summer 2023.  Dx suspected laryngopharyngeal reflux due to quantity of coffee consumption.  Indirect laryngoscopy normal.  Incidental finding on physical examination thyroid nodule which has been evaluated by ultrasound, benign appearance, listed elsewhere.

## 2022-05-08 NOTE — Assessment & Plan Note (Signed)
Incidental finding on examination with Dr. Constance Holster, ENT, where she was evaluated for hoarseness.  Ultrasound shows a few small benign-appearing nodules, none of which would require a biopsy, annual ultrasound surveillance advised.

## 2022-05-08 NOTE — Assessment & Plan Note (Signed)
Briefly touched on the role of vitamin D in bone health and reviewed that she takes daily supplement and eats a very healthy diet.  No plan at this time for DEXA, though she may be open to it in the future.  Her hesitancy is that she wishes to avoid bisphosphonates so any results of the DEXA may not contribute to a change in plan.  It would be helpful just for information to better understand the status of her bone health.

## 2022-05-09 LAB — CYTOLOGY - PAP
Adequacy: ABSENT
Diagnosis: NEGATIVE

## 2022-05-21 ENCOUNTER — Ambulatory Visit
Admission: RE | Admit: 2022-05-21 | Discharge: 2022-05-21 | Disposition: A | Discharge status: Home / Self Care | Payer: Medicare Other | Source: Ambulatory Visit | Attending: Internal Medicine | Admitting: Internal Medicine

## 2022-05-21 DIAGNOSIS — Z1231 Encounter for screening mammogram for malignant neoplasm of breast: Secondary | ICD-10-CM

## 2022-06-17 NOTE — Progress Notes (Signed)
Office Visit Note  Patient: Kristen Jacobs             Date of Birth: 1945/06/01           MRN: [id number]             PCP: Angelica Pou, MD Referring: Angelica Pou, MD Visit Date: 07/01/2022 Occupation: '@GUAROCC'$ @  Subjective:  Medication management  History of Present Illness: Kristen Jacobs is a 77 y.o. female with history of seropositive rheumatoid arthritis, osteoarthritis and degenerative disc disease.  She states she has been very active and has been going to Pilates classes on a regular basis.  She has been tolerating Plaquenil without any side effects.  She is currently on Plaquenil 200 mg p.o. twice daily Monday to Friday night.  She denies any joint swelling.  She continues to have some stiffness in her hands after activities.  She denies any discomfort in the epicondyle region, knee joints or feet.  She has intermittent discomfort in her neck.  She denies any radiculopathy.  She has been taking vitamin D 5000 units daily.    Activities of Daily Living:  Patient reports morning stiffness for 0 minute.   Patient Denies nocturnal pain.  Difficulty dressing/grooming: Denies Difficulty climbing stairs: Denies Difficulty getting out of chair: Denies Difficulty using hands for taps, buttons, cutlery, and/or writing: Reports  Review of Systems  Constitutional:  Positive for fatigue.  HENT:  Positive for mouth dryness. Negative for mouth sores.   Eyes:  Negative for dryness.  Respiratory:  Negative for shortness of breath.   Cardiovascular:  Negative for chest pain and palpitations.  Gastrointestinal:  Negative for blood in stool, constipation and diarrhea.  Endocrine: Negative for increased urination.  Genitourinary:  Negative for involuntary urination.  Musculoskeletal:  Positive for joint pain, joint pain, myalgias, muscle weakness, muscle tenderness and myalgias. Negative for gait problem, joint swelling and morning stiffness.  Skin:  Negative for color  change, rash, hair loss and sensitivity to sunlight.  Allergic/Immunologic: Negative for susceptible to infections.  Neurological:  Negative for dizziness and headaches.  Hematological:  Negative for swollen glands.  Psychiatric/Behavioral:  Positive for sleep disturbance. Negative for depressed mood. The patient is nervous/anxious.     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   Insomnia disorder 10/31/2021   Fear of vaccinations 02/22/2021   History of malignant neoplasm of skin 11/22/2020   Age-related osteoporosis without fracture 11/22/2020   Major depression in remission ([place]) 11/22/2020   History of hepatitis C 04/05/2020   Cervical spondylosis with radiculopathy 02/21/2020   Rheumatoid factor positive 02/17/2020   Laryngopharyngeal reflux (LPR) 12/20/2019   Avulsion fracture of left talus 09/05/2019   Primary osteoarthritis of left knee 07/19/2019   Subacromial bursitis of left shoulder joint 07/19/2019   Bilateral wrist pain, 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 05/24/2019   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  Urine POC dipstick normal today.    Joint pain     Family History  Problem Relation Age of Onset   Dementia Mother    Cancer Father    Bipolar disorder Sister    Leukemia Brother    Healthy Son    Asthma Son    Healthy Son    Breast cancer Neg Hx    Past Surgical History:  Procedure Laterality Date   BREAST BIOPSY Bilateral    benign   CATARACT EXTRACTION Right 03/2020    LAPAROSCOPY     LIVER BIOPSY     OOPHORECTOMY     right    TONSILLECTOMY     age 31    Social History   Social History Narrative   From [place], first Scientist, research (life sciences), family is [place]  Moved to [facility] in late 2010's.  Enjoys healthful living, Mediterranean diet, staying active.  First husband passed away due to complications from diabetes.  She has since remarried.   Immunization History  Administered Date(s) Administered   [employer] Vaccination 11/17/2019, 12/05/2019, 01/02/2020     Objective: Vital Signs: BP 124/60 (BP Location: Left Arm, Patient Position: Sitting, Cuff Size: Normal)   Pulse (!) 58   Resp 12   Ht 5' 5.5" (1.664 m)   Wt 139 lb (63 kg)   BMI 22.78 kg/m    Physical Exam Vitals and nursing note reviewed.  Constitutional:      Appearance: She is well-developed.  HENT:     Head: Normocephalic and atraumatic.  Eyes:     Conjunctiva/sclera: Conjunctivae normal.  Cardiovascular:     Rate and Rhythm: Normal rate and regular rhythm.     Heart sounds: Normal heart sounds.  Pulmonary:     Effort: Pulmonary effort is normal.     Breath sounds: Normal breath sounds.  Abdominal:     General: Bowel sounds are normal.     Palpations: Abdomen is soft.  Musculoskeletal:     Cervical back: Normal range of motion.  Lymphadenopathy:     Cervical: No cervical adenopathy.  Skin:    General: Skin is warm and dry.     Capillary Refill: Capillary refill takes less than 2 seconds.  Neurological:     Mental Status: She is alert and oriented to person, place, and time.  Psychiatric:        Behavior: Behavior normal.      Musculoskeletal Exam: Cervical, thoracic and lumbar spine were in good range of motion.  Shoulder joints, elbow joints, wrist joints, MCPs PIPs and DIPs with good range of motion with no synovitis.  She had bilateral PIP and DIP thickening.  Hip joints and knee joints in good range of motion without any warmth  swelling or effusion.  There was no tenderness over ankles or MTPs.  CDAI Exam: CDAI Score: -- Patient Global: 1 mm; Provider Global: 1 mm Swollen: --; Tender: -- Joint Exam 07/01/2022   No joint exam has been documented for this visit   There is currently no information documented on the homunculus. Go to the Rheumatology activity and complete the homunculus joint exam.  Investigation: No additional findings.  Imaging: No results found.  Recent Labs: Lab Results  Component Value Date   WBC 6.5 01/21/2022   HGB 14.1 01/21/2022   PLT 231 01/21/2022   NA 137 01/21/2022   K 5.1 01/21/2022   CL 106 01/21/2022   CO2 23 01/21/2022   GLUCOSE 73 01/21/2022   BUN 16 01/21/2022   CREATININE 0.97 01/21/2022   BILITOT 0.5 01/21/2022   ALKPHOS 87 01/24/2021  AST 18 01/21/2022   ALT 10 01/21/2022   PROT 5.9 (L) 01/21/2022   ALBUMIN 4.2 01/24/2021   CALCIUM 9.4 01/21/2022   GFRAA 67 10/25/2020    Speciality Comments: PLQ eye exam WNL 09/03/2021 [facility] f/u 12 months  Procedures:  No procedures performed Allergies: Other and Sulfa antibiotics   Assessment / Plan:     Visit Diagnoses: Rheumatoid arthritis involving multiple sites with positive rheumatoid factor (HCC) - MRI of the wrist joint showed synovitis, tenosynovitis and possible erosions: She had no synovitis on the examination today.  She has been taking hydroxychloroquine 200 mg p.o. twice daily Monday to Friday.  She has not noticed any episodes of swelling recently.  She has intermittent discomfort which she relates to her activities.  High risk medication use - Plaquenil 200 mg 1 tablet by mouth twice daily Monday to Friday start date March 08, 2020.PLQ eye exam WNL 09/03/2021 [facility] - Plan: CBC with Differential/Platelet, COMPLETE METABOLIC PANEL WITH GFR, today and then every 3 months.  CBC with Differential/Platelet, COMPLETE METABOLIC PANEL WITH GFR.  Information on immunization was  placed in the AVS.  Pain in both hands-she has intermittent stiffness.  No synovitis was noted.  She had bilateral PIP and DIP thickening.  Primary osteoarthritis of left knee-she denies any discomfort in her knee joint today.  No warmth swelling or effusion was noted.  Primary osteoarthritis of both feet-denies pain.  DDD (degenerative disc disease), cervical - Followed by Dr. Arnoldo Morale.  She gives history of intermittent discomfort.  She denies any radiculopathy.  Closed nondisplaced avulsion fracture of left talus with routine healing, subsequent encounter  Age-related osteoporosis without fracture-patient states that her T-score was -2.8.  I do not have DEXA results available.  I advised her to get repeat DEXA scan in 2 years from the last DEXA scan.  She takes vitamin D 5000 units daily.  She has been exercising on a regular basis.  History of hepatitis C  Other fatigue-related to insomnia.  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  She states she is unable to sleep without trazodone.  Anxiety  Orders: Orders Placed This Encounter  Procedures   CBC with Differential/Platelet   COMPLETE METABOLIC PANEL WITH GFR   CBC with Differential/Platelet   COMPLETE METABOLIC PANEL WITH GFR   No orders of the defined types were placed in this encounter.    Follow-Up Instructions: Return in about 5 months (around 11/29/2022) for Rheumatoid arthritis.   Bo Merino, MD  Note - This record has been created using Editor, commissioning.  Chart creation errors have been sought, but may not always  have been located. Such creation errors do not reflect on  the standard of medical care.

## 2022-06-23 ENCOUNTER — Other Ambulatory Visit: Payer: Self-pay | Admitting: *Deleted

## 2022-06-23 MED ORDER — HYDROXYCHLOROQUINE SULFATE 200 MG PO TABS: ORAL_TABLET | ORAL | 0 refills | Status: DC

## 2022-06-23 NOTE — Telephone Encounter (Signed)
Refill request received via fax from [facility] for PLQ  Next Visit: 07/01/2022  Last Visit: 01/21/2022  Labs: 01/21/2022 CBC WNL.   Total protein is borderline low-stable. Rest of CMP WNL.   Eye exam: WNL 09/03/2021    Current Dose per office note 01/21/2022: Plaquenil 200 mg 1 tablet by mouth twice daily Monday to Friday   DX: Rheumatoid arthritis involving multiple sites with positive rheumatoid factor   Last Fill: 03/19/2022  Okay to refill Plaquenil?

## 2022-07-01 ENCOUNTER — Encounter: Payer: Self-pay | Admitting: Rheumatology

## 2022-07-01 ENCOUNTER — Ambulatory Visit: Payer: Medicare Other | Attending: Rheumatology | Admitting: Rheumatology

## 2022-07-01 VITALS — BP 124/60 | HR 58 | Resp 12 | Ht 65.5 in | Wt 139.0 lb

## 2022-07-01 DIAGNOSIS — M1712 Unilateral primary osteoarthritis, left knee: Secondary | ICD-10-CM

## 2022-07-01 DIAGNOSIS — Z79899 Other long term (current) drug therapy: Secondary | ICD-10-CM

## 2022-07-01 DIAGNOSIS — S92155D Nondisplaced avulsion fracture (chip fracture) of left talus, subsequent encounter for fracture with routine healing: Secondary | ICD-10-CM

## 2022-07-01 DIAGNOSIS — M19071 Primary osteoarthritis, right ankle and foot: Secondary | ICD-10-CM

## 2022-07-01 DIAGNOSIS — M7712 Lateral epicondylitis, left elbow: Secondary | ICD-10-CM

## 2022-07-01 DIAGNOSIS — F419 Anxiety disorder, unspecified: Secondary | ICD-10-CM

## 2022-07-01 DIAGNOSIS — M79642 Pain in left hand: Secondary | ICD-10-CM

## 2022-07-01 DIAGNOSIS — F5101 Primary insomnia: Secondary | ICD-10-CM

## 2022-07-01 DIAGNOSIS — M79641 Pain in right hand: Secondary | ICD-10-CM

## 2022-07-01 DIAGNOSIS — M0579 Rheumatoid arthritis with rheumatoid factor of multiple sites without organ or systems involvement: Secondary | ICD-10-CM

## 2022-07-01 DIAGNOSIS — M818 Other osteoporosis without current pathological fracture: Secondary | ICD-10-CM

## 2022-07-01 DIAGNOSIS — M5030 Other cervical disc degeneration, unspecified cervical region: Secondary | ICD-10-CM

## 2022-07-01 DIAGNOSIS — Z8619 Personal history of other infectious and parasitic diseases: Secondary | ICD-10-CM

## 2022-07-01 DIAGNOSIS — R5383 Other fatigue: Secondary | ICD-10-CM

## 2022-07-01 DIAGNOSIS — M19072 Primary osteoarthritis, left ankle and foot: Secondary | ICD-10-CM

## 2022-07-01 NOTE — Patient Instructions (Signed)

## 2022-07-02 LAB — CBC WITH DIFFERENTIAL/PLATELET
Absolute Monocytes: 573 cells/uL (ref 200–950)
Basophils Absolute: 79 cells/uL (ref 0–200)
Basophils Relative: 1.3 %
Eosinophils Absolute: 262 cells/uL (ref 15–500)
Eosinophils Relative: 4.3 %
HCT: 42.5 % (ref 35.0–45.0)
Hemoglobin: 14.2 g/dL (ref 11.7–15.5)
Lymphs Abs: 1397 cells/uL (ref 850–3900)
MCH: 30 pg (ref 27.0–33.0)
MCHC: 33.4 g/dL (ref 32.0–36.0)
MCV: 89.7 fL (ref 80.0–100.0)
MPV: 11.2 fL (ref 7.5–12.5)
Monocytes Relative: 9.4 %
Neutro Abs: 3788 cells/uL (ref 1500–7800)
Neutrophils Relative %: 62.1 %
Platelets: 250 10*3/uL (ref 140–400)
RBC: 4.74 10*6/uL (ref 3.80–5.10)
RDW: 11.7 % (ref 11.0–15.0)
Total Lymphocyte: 22.9 %
WBC: 6.1 10*3/uL (ref 3.8–10.8)

## 2022-07-02 LAB — COMPLETE METABOLIC PANEL WITH GFR
AG Ratio: 1.7 (calc) (ref 1.0–2.5)
ALT: 12 U/L (ref 6–29)
AST: 19 U/L (ref 10–35)
Albumin: 3.8 g/dL (ref 3.6–5.1)
Alkaline phosphatase (APISO): 65 U/L (ref 37–153)
BUN: 15 mg/dL (ref 7–25)
CO2: 30 mmol/L (ref 20–32)
Calcium: 9.4 mg/dL (ref 8.6–10.4)
Chloride: 103 mmol/L (ref 98–110)
Creat: 0.86 mg/dL (ref 0.60–1.00)
Globulin: 2.2 g/dL (calc) (ref 1.9–3.7)
Glucose, Bld: 79 mg/dL (ref 65–99)
Potassium: 4.9 mmol/L (ref 3.5–5.3)
Sodium: 137 mmol/L (ref 135–146)
Total Bilirubin: 0.4 mg/dL (ref 0.2–1.2)
Total Protein: 6 g/dL — ABNORMAL LOW (ref 6.1–8.1)
eGFR: 70 mL/min/{1.73_m2} (ref >=60)

## 2022-07-02 NOTE — Progress Notes (Signed)
CBC and CMP are normal.

## 2022-08-25 ENCOUNTER — Other Ambulatory Visit: Payer: Self-pay | Admitting: Physician Assistant

## 2022-08-25 NOTE — Telephone Encounter (Signed)
Last Fill: 06/23/2022  Eye exam: WNL 09/03/2021    Labs: 07/01/2022 CBC and CMP are normal.   Next Visit: 12/02/2022  Last Visit: 07/01/2022  [id number] arthritis involving multiple sites with positive rheumatoid factor   Current Dose per office note 07/01/2022: Plaquenil 200 mg 1 tablet by mouth twice daily Monday to Friday   Okay to refill Plaquenil?

## 2022-08-29 ENCOUNTER — Ambulatory Visit (INDEPENDENT_AMBULATORY_CARE_PROVIDER_SITE_OTHER): Payer: Medicare Other | Admitting: Sports Medicine

## 2022-08-29 DIAGNOSIS — M47816 Spondylosis without myelopathy or radiculopathy, lumbar region: Secondary | ICD-10-CM | POA: Diagnosis not present

## 2022-08-29 DIAGNOSIS — M4722 Other spondylosis with radiculopathy, cervical region: Secondary | ICD-10-CM

## 2022-08-29 DIAGNOSIS — M5412 Radiculopathy, cervical region: Secondary | ICD-10-CM

## 2022-08-29 MED ORDER — TRAMADOL HCL 50 MG PO TABS: 50.0000 mg | ORAL_TABLET | Freq: Two times a day (BID) | ORAL | 3 refills | Status: DC | PRN

## 2022-08-29 MED ORDER — METHOCARBAMOL 500 MG PO TABS: 500.0000 mg | ORAL_TABLET | Freq: Three times a day (TID) | ORAL | 3 refills | Status: DC

## 2022-08-29 NOTE — Assessment & Plan Note (Signed)
Known cervical spondylosis, increasing neck pain, axial, worse with prolonged downgaze. Tramadol seems to help. We will refill her tramadol for use up to twice daily, Robaxin for use up to 3 times daily. Adding home conditioning as well as some formal physical therapy with dry needling. Avoid prolonged downgaze, return to see me 6 weeks as needed.

## 2022-08-29 NOTE — Assessment & Plan Note (Signed)
As above, chronic axial low back pain, adding PT, methocarbamol, tramadol. We can see her back as needed for this.

## 2022-08-29 NOTE — Progress Notes (Signed)
    Procedures performed today:    None.  Independent interpretation of notes and tests performed by another provider:   None.  Brief History, Exam, Impression, and Recommendations:    Cervical spondylosis with radiculopathy Known cervical spondylosis, increasing neck pain, axial, worse with prolonged downgaze. Tramadol seems to help. We will refill her tramadol for use up to twice daily, Robaxin for use up to 3 times daily. Adding home conditioning as well as some formal physical therapy with dry needling. Avoid prolonged downgaze, return to see me 6 weeks as needed.  Lumbar spondylosis As above, chronic axial low back pain, adding PT, methocarbamol, tramadol. We can see her back as needed for this.    ____________________________________________ Ihor Austin. Benjamin Stain, M.D., ABFM., CAQSM., AME. Primary Care and Sports Medicine [facility]  Adjunct Professor of Family Medicine  [employer]  Restaurant manager, fast food

## 2022-09-05 ENCOUNTER — Encounter: Payer: Self-pay | Admitting: Rehabilitative and Restorative Service Providers"

## 2022-09-05 ENCOUNTER — Ambulatory Visit: Payer: Medicare Other | Attending: Sports Medicine | Admitting: Rehabilitative and Restorative Service Providers"

## 2022-09-05 ENCOUNTER — Other Ambulatory Visit: Payer: Self-pay

## 2022-09-05 DIAGNOSIS — M4722 Other spondylosis with radiculopathy, cervical region: Secondary | ICD-10-CM | POA: Insufficient documentation

## 2022-09-05 DIAGNOSIS — M542 Cervicalgia: Secondary | ICD-10-CM | POA: Diagnosis present

## 2022-09-05 DIAGNOSIS — M6281 Muscle weakness (generalized): Secondary | ICD-10-CM | POA: Insufficient documentation

## 2022-09-05 DIAGNOSIS — M5459 Other low back pain: Secondary | ICD-10-CM | POA: Insufficient documentation

## 2022-09-05 NOTE — Therapy (Signed)
OUTPATIENT PHYSICAL THERAPY CERVICAL EVALUATION  Patient Name: Kristen Jacobs MRN: [id number] DOB:15-Oct-1945, 77 y.o., female Today's Date: 09/05/2022  END OF SESSION:  PT End of Session - 09/05/22 1113     Visit Number 1    Number of Visits 16    Date for PT Re-Evaluation 11/04/22    Authorization Type [employer] medicare    Progress Note Due on Visit 10    PT Start Time 1105    PT Stop Time 1148    PT Time Calculation (min) 43 min    Activity Tolerance Patient tolerated treatment well    Behavior During Therapy WFL for tasks assessed/performed             Past Medical History:  Diagnosis Date   Constipation    Hepatitis    History of UTI 11/22/2020   Recent event, fatigue w/o dysuria, symptoms resolved with antibiotic (name not recalled).  Urine POC dipstick normal today.    Joint pain    Past Surgical History:  Procedure Laterality Date   BREAST BIOPSY Bilateral    benign   CATARACT EXTRACTION Right 03/2020   LAPAROSCOPY     LIVER BIOPSY     OOPHORECTOMY     right    TONSILLECTOMY     age 29    Patient Active Problem List   Diagnosis Date Noted   Lumbar spondylosis 08/29/2022   COVID-19 virus infection 01/30/2022   Thyroid nodule 12/10/2021   Vasomotor rhinitis 12/10/2021   Insomnia disorder 10/31/2021   Fear of vaccinations 02/22/2021   History of malignant neoplasm of skin 11/22/2020   Age-related osteoporosis without fracture 11/22/2020   Major depression in remission (HCC) 11/22/2020   History of hepatitis C 04/05/2020   Cervical spondylosis with radiculopathy 02/21/2020   Rheumatoid factor positive 02/17/2020   Laryngopharyngeal reflux (LPR) 12/20/2019   Avulsion fracture of left talus 09/05/2019   Primary osteoarthritis of left knee 07/19/2019   Subacromial bursitis of left shoulder joint 07/19/2019   Bilateral wrist pain, 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 05/24/2019   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   Chronic right shoulder pain 11/12/2017   Pain of  right sacroiliac joint 09/10/2017   Chronic right hip pain 08/11/2017    PCP: Miguel Aschoff, MD REFERRING PROVIDER: Rodney Langton, MD REFERRING DIAG: [phone] (ICD-10-CM) - Cervical spondylosis with radiculopathy  THERAPY DIAG:  Cervicalgia  Muscle weakness (generalized)  Other low back pain  Rationale for Evaluation and Treatment: Rehabilitation ONSET DATE: 08/29/22 (referral date due to chronic issue)  SUBJECTIVE:  SUBJECTIVE STATEMENT: "Today my neck is not hurting."  She can always feel it, but it's not painful.  Her low back hurts and it goes into the R buttocks region along R lateral border of SI, and she also hurts in the thoracic spine.  When my neck hurts it usually hurts bilat upper trap and suboccipital region. She also gets pain into the posterior R UE.  The low back sometimes radiates into the left leg.  Hand dominance: Right PERTINENT HISTORY:  Rheumatoid arthritis PAIN:  Are you having pain? Yes: NPRS scale: up to 8/10 Pain location: low back and R buttocks, today is a 3-4/10 Pain description: achiness, pain worse with prolonged position Aggravating factors: walking Relieving factors: no pain sitting PRECAUTIONS: None WEIGHT BEARING RESTRICTIONS: No FALLS:  Has patient fallen in last 6 months? No LIVING ENVIRONMENT: Lives with: lives with their spouse Lives in: House/apartment PLOF: Independent PATIENT GOALS: reduce pain   OBJECTIVE:  PATIENT SURVEYS:  FOTO=65% (goal is 68%) SENSATION: WFL  POSTURE: No Significant postural limitations  PALPATION: Tender along R lateral border of sacrum, R QL, R glut med, R piriformis  CERVICAL ROM:  WFLs, although does note pain after laying prone from neck positioning.  LOWER EXTREMITY STRENGTH: 5/5 B hip  flexion, B knee extension, B knee flexion, and ankle DF.  LUMBAR ROM:  WNLs ROM, pain with R lateral trunk lean on R side, tightness on R noted with L sidebending. Rotation equal Bilat  UPPER EXTREMITY ROM:  WNLs  UPPER EXTREMITY MMT: not tested-- notes fatigue with doing her hair and reaching overhead for longer periods of time.   [facility] Adult PT Treatment:                                                DATE: 09/05/22 Therapeutic Exercise: Quadriped Thread the needle-- painful to wrists so modified to standing Supine Piriformis stretch Trunk rotation stretch Hip adductor stretch Prone Passive overpressure IR/ER hips Standing Wall lean with standing thread the needle QL stretch in door frame Manual Therapy: STM R piriformis and R QL Glut release with ball against the wall  PATIENT EDUCATION:  Education details: HEP Person educated: Patient Education method: Programmer, multimedia, Facilities manager, and Handouts Education comprehension: verbalized understanding and returned demonstration  HOME EXERCISE PROGRAM: Access Code: [email or web address] URL: [email or web address] Date: 09/05/2022 Prepared by: Margretta Ditty  Exercises - Standing with Forearms Thoracic Rotation  - 1 x daily - 7 x weekly - 1 sets - 10 reps - Standing Quadratus Lumborum Stretch with Doorway  - 1 x daily - 7 x weekly - 1 sets - 3 reps - 30 seconds hold - Standing Glute Med Mobilization with Small Ball on Wall  - 1 x daily - 7 x weekly - 1 sets - 10 reps - Cervical Retraction at Wall  - 1 x daily - 7 x weekly - 1 sets - 10 reps  ASSESSMENT:  CLINICAL IMPRESSION: Patient is a 77 y.o. female who was seen today for physical therapy evaluation and treatment for neck and low back pain. PT focused on releasing tight musculature in low back, R glut today. PT to further provide neck and UE strengthening to address all deficits. Patient is very active with pilates and PT recommended 1x/week due to her engagement with  community exercise.    OBJECTIVE IMPAIRMENTS: increased fascial restrictions, impaired flexibility, and  pain.   ACTIVITY LIMITATIONS: lifting and locomotion level  PARTICIPATION LIMITATIONS:  prolonged walking  PERSONAL FACTORS: 1 comorbidity: RA  are also affecting patient's functional outcome.   REHAB POTENTIAL: Good  CLINICAL DECISION MAKING: Stable/uncomplicated  EVALUATION COMPLEXITY: Low  GOALS: Goals reviewed with patient? Yes  SHORT TERM GOALS: Target date: 10/03/22  The patient will be indep with HEP Baseline: initiated at eval Goal status: INITIAL  2.  The patient will report pain at its worst at 6/10. Baseline: 8/10 Goal status: INITIAL  3.  The patient will return to pilates class. Baseline: stopped x 2 weeks Goal status: INITIAL  LONG TERM GOALS: Target date: 10/30/22  The patient will be indep with HEP progression. Baseline:  initiated at eval Goal status: INITIAL  2.  The patient will report no resting pain. Baseline: 3-4/10 Goal status: INITIAL  3.  The patient will improve FOTO up to 68% Baseline: 63% Goal status: INITIAL  4.  The patient will be able to walk for 20 minutes nonstop without increase in pain. Baseline:  pain with prolonged walking Goal status: INITIAL  5.  The patient will be able to lift 10 lbs overhead to demo improved UE strength. Baseline:  Notes feeling weaker with overhead tasks. Goal status: INITIAL  PLAN:  PT FREQUENCY: 1x/week  PT DURATION: 8 weeks  PLANNED INTERVENTIONS: Therapeutic exercises, Therapeutic activity, Neuromuscular re-education, Balance training, Gait training, Patient/Family education, Self Care, Joint mobilization, Dry Needling, Electrical stimulation, Spinal mobilization, Cryotherapy, Moist heat, Taping, and Manual therapy  PLAN FOR NEXT SESSION: DN R QL and R piriformis, overhead strengthening, postural strengthening, sore activation.   Antonetta Clanton, PT 09/05/2022, 1:11 PM

## 2022-09-10 ENCOUNTER — Encounter: Payer: Self-pay | Admitting: Rehabilitative and Restorative Service Providers"

## 2022-09-10 ENCOUNTER — Ambulatory Visit: Payer: Medicare Other | Admitting: Rehabilitative and Restorative Service Providers"

## 2022-09-10 DIAGNOSIS — M542 Cervicalgia: Secondary | ICD-10-CM | POA: Diagnosis not present

## 2022-09-10 DIAGNOSIS — M5459 Other low back pain: Secondary | ICD-10-CM

## 2022-09-10 DIAGNOSIS — M6281 Muscle weakness (generalized): Secondary | ICD-10-CM

## 2022-09-10 NOTE — Therapy (Signed)
OUTPATIENT PHYSICAL THERAPY CERVICAL TREATMENT  Patient Name: Kristen Jacobs MRN: [id number] DOB:Sep 14, 1945, 76 y.o., female Today's Date: 09/10/2022  END OF SESSION:  PT End of Session - 09/10/22 0844     Visit Number 2    Number of Visits 16    Date for PT Re-Evaluation 11/04/22    Authorization Type [employer] medicare    Progress Note Due on Visit 10    PT Start Time 0848    PT Stop Time 0934    PT Time Calculation (min) 46 min    Activity Tolerance Patient tolerated treatment well    Behavior During Therapy [facility] for tasks assessed/performed            Past Medical History:  Diagnosis Date   Constipation    Hepatitis    History of UTI 11/22/2020   Recent event, fatigue w/o dysuria, symptoms resolved with antibiotic (name not recalled).  Urine POC dipstick normal today.    Joint pain    Past Surgical History:  Procedure Laterality Date   BREAST BIOPSY Bilateral    benign   CATARACT EXTRACTION Right 03/2020   LAPAROSCOPY     LIVER BIOPSY     OOPHORECTOMY     right    TONSILLECTOMY     age 52    Patient Active Problem List   Diagnosis Date Noted   Lumbar spondylosis 08/29/2022   COVID-19 virus infection 01/30/2022   Thyroid nodule 12/10/2021   Vasomotor rhinitis 12/10/2021   Insomnia disorder 10/31/2021   Fear of vaccinations 02/22/2021   History of malignant neoplasm of skin 11/22/2020   Age-related osteoporosis without fracture 11/22/2020   Major depression in remission (HCC) 11/22/2020   History of hepatitis C 04/05/2020   Cervical spondylosis with radiculopathy 02/21/2020   Rheumatoid factor positive 02/17/2020   Laryngopharyngeal reflux (LPR) 12/20/2019   Avulsion fracture of left talus 09/05/2019   Primary osteoarthritis of left knee 07/19/2019   Subacromial bursitis of left shoulder joint 07/19/2019   Bilateral wrist pain, 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 Her neck gets worse throughout the day- it is not currently hurting. Hand dominance: Right PERTINENT HISTORY:  Rheumatoid arthritis PAIN:  Are you having pain? Yes: NPRS scale: up to 8/10 Pain location: low back and R buttocks Pain description: achiness, pain worse with prolonged position Aggravating factors: walking Relieving factors: no pain sitting PRECAUTIONS: None WEIGHT BEARING RESTRICTIONS: No FALLS:  Has patient fallen in last 6 months? No LIVING ENVIRONMENT: Lives with: lives with their spouse Lives in: House/apartment PLOF: Independent PATIENT GOALS: reduce pain  OBJECTIVE:  (Measures in this section from initial evaluation unless otherwise noted) PATIENT SURVEYS:  FOTO=65% (goal is 68%) SENSATION: WFL  POSTURE: No Significant postural limitations  PALPATION: Tender along R lateral border of sacrum, R QL, R glut med, R piriformis  CERVICAL ROM:  WFLs, although does note pain after laying prone from neck positioning.  LOWER EXTREMITY STRENGTH: 5/5 B hip flexion, B knee extension, B knee flexion, and ankle DF.  LUMBAR ROM:  WNLs ROM, pain with R lateral trunk lean on R side, tightness on R noted with L sidebending. Rotation equal Bilat  UPPER EXTREMITY ROM:   WNLs  UPPER EXTREMITY MMT: not tested-- notes fatigue with doing her hair and reaching overhead for longer periods of time.   [facility] Adult PT Treatment:                                                DATE: 09/10/22 Therapeutic Exercise: Supine Feet to the L with leaning to the L with arms to stretch R QL Sidelying Hip hike and depression with R foot on ball Prone On elbows with cross body reaching Standing Split lunge position with reach to top of yoga block 10 reps R and L sides Seated Piriformis modified pigeon in sitting Pigeon pose with leg extended Manual Therapy: STM with QL, R sacral border, deep hip rotators, piriformis, glut med, and lumbar paraspinals Trigger Point Dry-Needling  Treatment instructions: Expect mild to moderate muscle soreness.  Patient verbalized understanding of these instructions and education. Patient Consent Given: Yes Education handout provided: Previously provided Muscles treated: R QL, R paraspinals (lumbar multifidi) Treatment response/outcome: palpable lengthening, and dec'd pain Pain reduced to 0/10 in sitting and 1/10 with walking after STM and DN  [facility] Adult PT Treatment:                                                DATE: 09/05/22 Therapeutic Exercise: Quadriped Thread the needle-- painful to wrists so modified to standing Supine Piriformis stretch Trunk rotation stretch Hip adductor stretch Prone Passive overpressure IR/ER hips Standing Wall lean with standing thread the needle QL stretch in door frame Manual Therapy: STM R piriformis and R QL Glut release with ball against the wall  PATIENT EDUCATION:  Education details: HEP Person educated: Patient Education method: Programmer, multimedia, Facilities manager, and Handouts Education comprehension: verbalized understanding and returned demonstration  HOME EXERCISE PROGRAM: Access Code: [email or web address] URL: [email or web address] Date: 09/10/2022 Prepared by: Margretta Ditty  Exercises - Standing with Forearms Thoracic Rotation  - 1 x daily - 7 x weekly - 1 sets - 10 reps - Standing Quadratus Lumborum Stretch with Doorway  -  1 x daily - 7 x weekly - 1 sets - 3 reps - 30 seconds hold - Standing Glute Med Mobilization with Small Ball on Wall  - 1 x daily - 7 x weekly - 1 sets - 10 reps - Cervical Retraction at Wall  - 1 x daily - 7 x weekly - 1 sets - 10 reps - Pigeon Pose  - 2 x daily - 7 x weekly - 1 sets - 10 reps  ASSESSMENT:  CLINICAL IMPRESSION: Patient has pain reduced to 1/10 at end of session and responded well to DN and STM. PT to continue to develop HEP for strengthening and stretching.    OBJECTIVE IMPAIRMENTS: increased fascial restrictions, impaired flexibility, and pain.   GOALS: Goals reviewed with patient? Yes  SHORT TERM GOALS: Target date: 10/03/22  The patient will be indep with HEP Baseline: initiated at eval Goal status: INITIAL  2.  The patient will report pain at its worst at 6/10. Baseline: 8/10 Goal status: INITIAL  3.  The patient will return to pilates class. Baseline: stopped x 2 weeks Goal status: INITIAL  LONG TERM GOALS: Target date: 10/30/22  The patient will be indep with HEP progression. Baseline:  initiated at eval Goal status: INITIAL  2.  The patient will report no resting pain. Baseline: 3-4/10 Goal status: INITIAL  3.  The patient will improve FOTO up to 68% Baseline: 63% Goal status: INITIAL  4.  The patient will be able to walk for 20 minutes nonstop without increase in pain. Baseline:  pain with prolonged walking Goal status: INITIAL  5.  The patient will be able to lift 10 lbs overhead to demo improved UE strength. Baseline:  Notes feeling weaker with overhead tasks. Goal status: INITIAL  PLAN:  PT FREQUENCY: 1x/week  PT DURATION: 8 weeks  PLANNED INTERVENTIONS: Therapeutic exercises, Therapeutic activity, Neuromuscular re-education, Balance training, Gait training, Patient/Family  education, Self Care, Joint mobilization, Dry Needling, Electrical stimulation, Spinal mobilization, Cryotherapy, Moist heat, Taping, and Manual therapy  PLAN FOR NEXT SESSION: DN R QL and R piriformis, overhead strengthening, postural strengthening, Core activation.   Tagg Eustice, PT 09/10/2022, 10:24 AM

## 2022-09-15 ENCOUNTER — Encounter: Payer: Self-pay | Admitting: Rehabilitative and Restorative Service Providers"

## 2022-09-15 ENCOUNTER — Ambulatory Visit: Payer: Medicare Other | Admitting: Rehabilitative and Restorative Service Providers"

## 2022-09-15 DIAGNOSIS — M542 Cervicalgia: Secondary | ICD-10-CM

## 2022-09-15 DIAGNOSIS — M5459 Other low back pain: Secondary | ICD-10-CM

## 2022-09-15 DIAGNOSIS — M6281 Muscle weakness (generalized): Secondary | ICD-10-CM

## 2022-09-15 NOTE — Therapy (Signed)
OUTPATIENT PHYSICAL THERAPY CERVICAL TREATMENT  Patient Name: Kristen Jacobs MRN: [id number] DOB:1945/12/05, 77 y.o., female Today's Date: 09/15/2022  END OF SESSION:  PT End of Session - 09/15/22 1025     Visit Number 3    Number of Visits 16    Date for PT Re-Evaluation 11/04/22    Authorization Type [employer] medicare    Progress Note Due on Visit 10    PT Start Time 1025    PT Stop Time 1105    PT Time Calculation (min) 40 min    Activity Tolerance Patient tolerated treatment well    Behavior During Therapy WFL for tasks assessed/performed            Past Medical History:  Diagnosis Date   Constipation    Hepatitis    History of UTI 11/22/2020   Recent event, fatigue w/o dysuria, symptoms resolved with antibiotic (name not recalled).  Urine POC dipstick normal today.    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   Insomnia disorder 10/31/2021   Fear of vaccinations 02/22/2021   History of malignant neoplasm of skin 11/22/2020   Age-related osteoporosis without fracture 11/22/2020   Major depression in remission (HCC) 11/22/2020   History of hepatitis C 04/05/2020   Cervical spondylosis with radiculopathy 02/21/2020   Rheumatoid factor positive 02/17/2020   Laryngopharyngeal reflux (LPR) 12/20/2019   Avulsion fracture of left talus 09/05/2019   Primary osteoarthritis of left knee 07/19/2019   Subacromial bursitis of left shoulder joint 07/19/2019   Bilateral wrist pain, 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 She got short term relief from dry needling, but it didn't last long. R hand dominant. PERTINENT HISTORY:  Rheumatoid arthritis PAIN:  Are you having pain? Yes: NPRS scale: "I feel everything, but nothing hurts" /10 Pain location: lR hip Pain description: achiness, pain worse with prolonged position Aggravating factors: walking Relieving factors: no pain sitting PRECAUTIONS: None WEIGHT BEARING RESTRICTIONS: No FALLS:  Has patient fallen in last 6 months? No LIVING ENVIRONMENT: Lives with: lives with their spouse Lives in: House/apartment PLOF: Independent PATIENT GOALS: reduce pain  OBJECTIVE:  (Measures in this section from initial evaluation unless otherwise noted) PATIENT SURVEYS:  FOTO=65% (goal is 68%) SENSATION: WFL  POSTURE: No Significant postural limitations  PALPATION: Tender along R lateral border of sacrum, R QL, R glut med, R piriformis  CERVICAL ROM:  WFLs, although does note pain after laying prone from neck positioning.  LOWER EXTREMITY STRENGTH: 5/5 B hip flexion, B knee extension, B knee flexion, and ankle DF.  LUMBAR ROM:  WNLs ROM, pain with R lateral trunk lean on R side, tightness on R noted with L sidebending. 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  [facility] Adult PT Treatment:                                                DATE: 09/15/22  Therapeutic Exercise: Foam roller on mat with hip rotated to increase pressure on R side Child's pose Standing Tennis ball roll for STM release R hip Door stretch pectoralis x 2 reps x 30 second holds Upper limb neural tension gliding x R and L sides Manual Therapy: SI mobilization PA mobs grade II-III STM R glut medius, piriformis, QL and lumbar paraspinals Trigger Point Dry-Needling  Treatment instructions: Expect mild to moderate muscle soreness. S/S of pneumothorax if dry needled over a lung field, and to seek immediate medical attention should they occur. Patient verbalized understanding of these instructions and education. Patient Consent Given: Yes Education handout provided: Previously provided Muscles treated: lumbar multifidi, R piriformis, R QL Treatment response/outcome: palpable lengthening, reduced pain Neuromuscular re-ed: 15 seocnds bilaterally for single leg standing Self Care: Discussed using ice for R hip pain Pt used to do an 8 minute stretch each morning. She has stopped   [facility] Adult PT Treatment:                                                DATE: 09/10/22 Therapeutic Exercise: Supine Feet to the L with leaning to the L with arms to stretch R QL Sidelying Hip hike and depression with R foot on ball Prone On elbows with cross body reaching Standing Split lunge position with reach to top of yoga block 10 reps R and L sides Seated Piriformis modified pigeon in sitting Pigeon pose with leg extended Manual Therapy: STM with QL, R sacral border, deep hip rotators, piriformis, glut med, and lumbar paraspinals Trigger Point Dry-Needling  Treatment instructions: Expect mild to moderate muscle soreness.  Patient verbalized understanding of  these instructions and education. Patient  Consent Given: Yes Education handout provided: Previously provided Muscles treated: R QL, R paraspinals (lumbar multifidi) Treatment response/outcome: palpable lengthening, and dec'd pain Pain reduced to 0/10 in sitting and 1/10 with walking after STM and DN  [facility] Adult PT Treatment:                                                DATE: 09/05/22 Therapeutic Exercise: Quadriped Thread the needle-- painful to wrists so modified to standing Supine Piriformis stretch Trunk rotation stretch Hip adductor stretch Prone Passive overpressure IR/ER hips Standing Wall lean with standing thread the needle QL stretch in door frame Manual Therapy: STM R piriformis and R QL Glut release with ball against the wall  PATIENT EDUCATION:  Education details: HEP Person educated: Patient Education method: Programmer, multimedia, Facilities manager, and Handouts Education comprehension: verbalized understanding and returned demonstration  HOME EXERCISE PROGRAM: Access Code: [email or web address] URL: [email or web address] Date: 09/10/2022 Prepared by: Margretta Ditty  Exercises - Standing with Forearms Thoracic Rotation  - 1 x daily - 7 x weekly - 1 sets - 10 reps - Standing Quadratus Lumborum Stretch with Doorway  - 1 x daily - 7 x weekly - 1 sets - 3 reps - 30 seconds hold - Standing Glute Med Mobilization with Small Ball on Wall  - 1 x daily - 7 x weekly - 1 sets - 10 reps - Cervical Retraction at Wall  - 1 x daily - 7 x weekly - 1 sets - 10 reps - Pigeon Pose  - 2 x daily - 7 x weekly - 1 sets - 10 reps  ASSESSMENT:  CLINICAL IMPRESSION: Patient get improvement within session and SI traction of low back (PT at head of bed) feels like a release for tightness. PT to progress deep hip stretching. We have focused on low back as neck is feeling better.  OBJECTIVE IMPAIRMENTS: increased fascial restrictions, impaired flexibility, and pain.   GOALS: Goals reviewed  with patient? Yes  SHORT TERM GOALS: Target date: 10/03/22  The patient will be indep with HEP Baseline: initiated at eval Goal status: INITIAL  2.  The patient will report pain at its worst at 6/10. Baseline: 8/10 Goal status: INITIAL  3.  The patient will return to pilates class. Baseline: stopped x 2 weeks Goal status: INITIAL  LONG TERM GOALS: Target date: 10/30/22  The patient will be indep with HEP progression. Baseline:  initiated at eval Goal status: INITIAL  2.  The patient will report no resting pain. Baseline: 3-4/10 Goal status: INITIAL  3.  The patient will improve FOTO up to 68% Baseline: 63% Goal status: INITIAL  4.  The patient will be able to walk for 20 minutes nonstop without increase in pain. Baseline:  pain with prolonged walking Goal status: INITIAL  5.  The patient will be able to lift 10 lbs overhead to demo improved UE strength. Baseline:  Notes feeling weaker with overhead tasks. Goal status: INITIAL  PLAN:  PT FREQUENCY: 1x/week  PT DURATION: 8 weeks  PLANNED INTERVENTIONS: Therapeutic exercises, Therapeutic activity, Neuromuscular re-education, Balance training, Gait training, Patient/Family education, Self Care, Joint mobilization, Dry Needling, Electrical stimulation, Spinal mobilization, Cryotherapy, Moist heat, Taping, and Manual therapy  PLAN FOR NEXT SESSION: DN R QL and R piriformis, overhead strengthening, postural strengthening, Core activation.   Erian Lariviere, PT 09/15/2022, 10:25 AM

## 2022-09-17 ENCOUNTER — Encounter: Payer: Medicare Other | Admitting: Rehabilitative and Restorative Service Providers"

## 2022-09-24 ENCOUNTER — Encounter: Payer: Self-pay | Admitting: Rehabilitative and Restorative Service Providers"

## 2022-09-24 ENCOUNTER — Ambulatory Visit: Payer: Medicare Other | Admitting: Rehabilitative and Restorative Service Providers"

## 2022-09-24 DIAGNOSIS — M5459 Other low back pain: Secondary | ICD-10-CM

## 2022-09-24 DIAGNOSIS — M6281 Muscle weakness (generalized): Secondary | ICD-10-CM

## 2022-09-24 DIAGNOSIS — M542 Cervicalgia: Secondary | ICD-10-CM

## 2022-09-24 NOTE — Therapy (Signed)
OUTPATIENT PHYSICAL THERAPY CERVICAL TREATMENT  Patient Name: Chara Biby MRN: [id number] DOB:[birth year], 77 y.o., female Today's Date: 09/24/2022  END OF SESSION:  PT End of Session - 09/24/22 1020     Visit Number 4    Number of Visits 16    Date for PT Re-Evaluation 11/04/22    Authorization Type [employer] medicare    Progress Note Due on Visit 10    PT Start Time 1020    PT Stop Time 1100    PT Time Calculation (min) 40 min    Activity Tolerance Patient tolerated treatment well    Behavior During Therapy WFL for tasks assessed/performed            Past Medical History:  Diagnosis Date   Constipation    Hepatitis    History of UTI 11/22/2020   Recent event, fatigue w/o dysuria, symptoms resolved with antibiotic (name not recalled).  Urine POC dipstick normal today.    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   Insomnia disorder 10/31/2021   Fear of vaccinations 02/22/2021   History of malignant neoplasm of skin 11/22/2020   Age-related osteoporosis without fracture 11/22/2020   Major depression in remission (HCC) 11/22/2020   History of hepatitis C 04/05/2020   Cervical spondylosis with radiculopathy 02/21/2020   Rheumatoid factor positive 02/17/2020   Laryngopharyngeal reflux (LPR) 12/20/2019   Avulsion fracture of left talus 09/05/2019   Primary osteoarthritis of left knee 07/19/2019   Subacromial bursitis of left shoulder joint 07/19/2019   Bilateral wrist pain, 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 05/24/2019   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   Chronic right shoulder pain 11/12/2017   Pain of  right sacroiliac joint 09/10/2017   Chronic right hip pain 08/11/2017    PCP: Miguel Aschoff, MD REFERRING PROVIDER: Rodney Langton, MD REFERRING DIAG: [phone] (ICD-10-CM) - Cervical spondylosis with radiculopathy  THERAPY DIAG:  Cervicalgia  Muscle weakness (generalized)  Other low back pain  Rationale for Evaluation and Treatment: Rehabilitation ONSET DATE: 08/29/22 (referral date due to chronic issue)  SUBJECTIVE:  SUBJECTIVE STATEMENT: The patient reports "it's not gone, but it's better." She was on vacation last week and didn't do anything -- she only had one bad day (low back and side). R hand dominant. PERTINENT HISTORY:  Rheumatoid arthritis PAIN:  Are you having pain? Yes: NPRS scale: "I feel everything, but nothing hurts" /10 Pain location: lR hip Pain description: achiness, pain worse with prolonged position Aggravating factors: walking Relieving factors: no pain sitting PRECAUTIONS: None WEIGHT BEARING RESTRICTIONS: No FALLS:  Has patient fallen in last 6 months? No LIVING ENVIRONMENT: Lives with: lives with their spouse Lives in: House/apartment PLOF: Independent PATIENT GOALS: reduce pain  OBJECTIVE:  (Measures in this section from initial evaluation unless otherwise noted) PATIENT SURVEYS:  FOTO=65% (goal is 68%) SENSATION: WFL POSTURE: No Significant postural limitations PALPATION: Tender along R lateral border of sacrum, R QL, R glut med, R piriformis CERVICAL ROM:  WFLs, although does note pain after laying prone from neck positioning. LOWER EXTREMITY STRENGTH: 5/5 B hip flexion, B knee extension, B knee flexion, and ankle DF. LUMBAR ROM:  WNLs ROM, pain with R lateral trunk lean on R side, tightness on R noted with L sidebending. 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  [facility] Adult PT Treatment:                                                DATE: 09/24/22 Therapeutic Exercise: Prone on elbows to spinal twist x 3 reps R and L x 30 seconds Supine strap stretch hamstrings, adductors and IT Band R and L sides Foam rolling x R hip Standing Single limb stance Clock reach anteriorly 5 reps x 2 sets R and L Lateral clock reach x 5 reps x 2 sets R and L Self Care: Discussed continuing pilates and adding new strengthening into routine   [facility] Adult PT Treatment:                                                DATE: 09/15/22  Therapeutic Exercise: Foam roller on mat with hip rotated to increase pressure on R side Child's pose Standing Tennis ball roll for STM release R hip Door stretch pectoralis x 2 reps x 30 second holds Upper limb neural tension gliding x R and L sides Manual Therapy: SI mobilization PA mobs grade II-III STM R glut medius, piriformis, QL and lumbar paraspinals Trigger Point Dry-Needling  Treatment instructions: Expect mild to moderate muscle soreness. S/S of pneumothorax if dry needled over a lung field, and to seek immediate medical attention should they occur. Patient verbalized understanding of these instructions and education. Patient Consent Given: Yes Education handout provided: Previously provided Muscles treated: lumbar multifidi, R piriformis, R QL Treatment response/outcome: palpable lengthening, reduced pain Neuromuscular re-ed: 15 seocnds bilaterally for single leg standing Self Care: Discussed using ice for R hip pain Pt used to do an 8 minute stretch each morning. She has stopped   PATIENT EDUCATION:  Education details: HEP Person educated: Patient Education method: Explanation, Demonstration, and Handouts Education comprehension: verbalized understanding and returned  demonstration  HOME EXERCISE PROGRAM: Access Code: [email or web address]  URL: [email or web address] Date: 09/10/2022 Prepared by: Margretta Ditty  Exercises - Standing with Forearms Thoracic Rotation  - 1 x daily - 7 x weekly - 1 sets - 10 reps - Standing Quadratus Lumborum Stretch with Doorway  - 1 x daily - 7 x weekly - 1 sets - 3 reps - 30 seconds hold - Standing Glute Med Mobilization with Small Ball on Wall  - 1 x daily - 7 x weekly - 1 sets - 10 reps - Cervical Retraction at Wall  - 1 x daily - 7 x weekly - 1 sets - 10 reps - Pigeon Pose  - 2 x daily - 7 x weekly - 1 sets - 10 reps  ASSESSMENT:  CLINICAL IMPRESSION: The patient tolerating increased strengthening today. The R deep glut region increased pain after exercise in standing, but reduced with foam rolling. Patient to add more strengthening to home routine.  OBJECTIVE IMPAIRMENTS: increased fascial restrictions, impaired flexibility, and pain.   GOALS: Goals reviewed with patient? Yes  SHORT TERM GOALS: Target date: 10/03/22  The patient will be indep with HEP Baseline: initiated at eval Goal status: INITIAL  2.  The patient will report pain at its worst at 6/10. Baseline: 8/10 Goal status: INITIAL  3.  The patient will return to pilates class. Baseline: stopped x 2 weeks Goal status: MET-- she returned to pilates yesterday and is tight today  LONG TERM GOALS: Target date: 10/30/22  The patient will be indep with HEP progression. Baseline:  initiated at eval Goal status: INITIAL  2.  The patient will report no resting pain. Baseline: 3-4/10 Goal status: INITIAL  3.  The patient will improve FOTO up to 68% Baseline: 63% Goal status: INITIAL  4.  The patient will be able to walk for 20 minutes nonstop without increase in pain. Baseline:  pain with prolonged walking Goal status: INITIAL  5.  The patient will be able to lift 10 lbs overhead to demo improved UE strength. Baseline:  Notes feeling  weaker with overhead tasks. Goal status: INITIAL  PLAN:  PT FREQUENCY: 1x/week  PT DURATION: 8 weeks  PLANNED INTERVENTIONS: Therapeutic exercises, Therapeutic activity, Neuromuscular re-education, Balance training, Gait training, Patient/Family education, Self Care, Joint mobilization, Dry Needling, Electrical stimulation, Spinal mobilization, Cryotherapy, Moist heat, Taping, and Manual therapy  PLAN FOR NEXT SESSION: DN R QL and R piriformis, overhead strengthening, postural strengthening, Core activation. Discuss cervical region and consider DN + stretching L upper trap  Corianne Buccellato, PT 09/24/2022, 10:20 AM

## 2022-10-01 ENCOUNTER — Ambulatory Visit: Payer: Medicare Other | Admitting: Rehabilitative and Restorative Service Providers"

## 2022-10-01 ENCOUNTER — Encounter: Payer: Self-pay | Admitting: Rehabilitative and Restorative Service Providers"

## 2022-10-01 DIAGNOSIS — M6281 Muscle weakness (generalized): Secondary | ICD-10-CM

## 2022-10-01 DIAGNOSIS — M542 Cervicalgia: Secondary | ICD-10-CM

## 2022-10-01 NOTE — Therapy (Signed)
OUTPATIENT PHYSICAL THERAPY CERVICAL TREATMENT  Patient Name: Hessa Granieri MRN: [id number] DOB:1946/03/11, 77 y.o., female Today's Date: 10/01/2022  END OF SESSION:  PT End of Session - 10/01/22 1106     Visit Number 5    Number of Visits 16    Date for PT Re-Evaluation 11/04/22    Authorization Type [employer] medicare    Progress Note Due on Visit 10    PT Start Time 1106    PT Stop Time 1145    PT Time Calculation (min) 39 min    Activity Tolerance Patient tolerated treatment well    Behavior During Therapy WFL for tasks assessed/performed             Past Medical History:  Diagnosis Date   Constipation    Hepatitis    History of UTI 11/22/2020   Recent event, fatigue w/o dysuria, symptoms resolved with antibiotic (name not recalled).  Urine POC dipstick normal today.    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   Insomnia disorder 10/31/2021   Fear of vaccinations 02/22/2021   History of malignant neoplasm of skin 11/22/2020   Age-related osteoporosis without fracture 11/22/2020   Major depression in remission (HCC) 11/22/2020   History of hepatitis C 04/05/2020   Cervical spondylosis with radiculopathy 02/21/2020   Rheumatoid factor positive 02/17/2020   Laryngopharyngeal reflux (LPR) 12/20/2019   Avulsion fracture of left talus 09/05/2019   Primary osteoarthritis of left knee 07/19/2019   Subacromial bursitis of left shoulder joint 07/19/2019   Bilateral wrist pain, 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 The patient reports she is back to her normal routine (gardening, pillates, exercises) this week. She was muscle sore after last session. R hand dominant. PERTINENT HISTORY:  Rheumatoid arthritis PAIN:  Are you having pain? Yes: NPRS scale: "I feel everything, but nothing hurts" /10 Pain location: lR hip Pain description: achiness, pain worse with prolonged position Aggravating factors: walking Relieving factors: no pain sitting PRECAUTIONS: None WEIGHT BEARING RESTRICTIONS: No FALLS:  Has patient fallen in last 6 months? No LIVING ENVIRONMENT: Lives with: lives with their spouse Lives in: House/apartment PLOF: Independent PATIENT GOALS: reduce pain  OBJECTIVE:  (Measures in this section from initial evaluation unless otherwise noted) PATIENT SURVEYS:  FOTO=65% (goal is 68%) SENSATION: WFL POSTURE: No Significant postural limitations PALPATION: Tender along R lateral border of sacrum, R QL, R glut med, R piriformis CERVICAL ROM:  WFLs, although does note pain after laying prone from neck positioning. LOWER EXTREMITY STRENGTH: 5/5 B hip flexion, B knee extension, B knee flexion, and ankle DF. LUMBAR ROM:  WNLs ROM, pain with R lateral trunk lean on R side, tightness on R noted  with L sidebending. 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  [facility] Adult PT Treatment:                                                DATE: 10/01/22 Therapeutic Exercise: ER with red theraband x 10 reps x 2 sets Manual Therapy: STM upper trap/levator, lumbar QL, infraspinatus, rhomboids, glut medius and bilateral sacral borders Trigger Point Dry-Needling  Treatment instructions: Expect mild to moderate muscle soreness. S/S of pneumothorax if dry needled over a lung field, and to seek immediate medical attention should they occur. Patient verbalized understanding of these instructions and education. Patient Consent Given: Yes Education handout provided: Previously provided Muscles treated: R lumbar multifidi, R QL, R infraspinatus, bilat upper trap Treatment response/outcome: twitch response and palpable lengthening, significant pain relief after R infraspinatus DN Self Care: Self STM with tennis ball on wall for R infraspinatus and rhomboids   [facility] Adult PT Treatment:                                                DATE: 09/24/22 Therapeutic Exercise: Prone on elbows to spinal twist x 3 reps R and L x 30 seconds Supine strap stretch hamstrings, adductors and IT Band R and L sides Foam rolling x R hip Standing Single limb stance Clock reach anteriorly 5 reps x 2 sets R and L Lateral clock reach x 5 reps x 2 sets R and L Self Care: Discussed continuing pilates and adding new strengthening into routine   PATIENT EDUCATION:  Education details: HEP Person educated: Patient Education method: Explanation, Demonstration, and Handouts Education comprehension: verbalized understanding and returned demonstration  HOME EXERCISE PROGRAM: Access Code: [email or web address] URL: [email or web address] Date: 10/01/2022 Prepared by: Margretta Ditty  Exercises -  Standing with Forearms Thoracic Rotation  - 1 x daily - 7 x weekly - 1 sets - 10 reps - Standing Quadratus Lumborum Stretch  with Doorway  - 1 x daily - 7 x weekly - 1 sets - 3 reps - 30 seconds hold - Standing Glute Med Mobilization with Small Ball on Wall  - 1 x daily - 7 x weekly - 1 sets - 10 reps - Cervical Retraction at Wall  - 1 x daily - 7 x weekly - 1 sets - 10 reps - Sidelying Open Book Thoracic Lumbar Rotation and Extension  - 2 x daily - 7 x weekly - 1 sets - 5 reps - Pigeon Pose  - 2 x daily - 7 x weekly - 1 sets - 10 reps - Single Leg Balance with Clock Reach  - 2 x daily - 7 x weekly - 1 sets - 10 reps - Split Squats Upright Trunk with Dumbbells (Quad Bias)  - 2 x daily - 7 x weekly - 1 sets - 10 reps - Shoulder External Rotation and Scapular Retraction with Resistance  - 2 x daily - 7 x weekly - 2 sets - 10 reps  ASSESSMENT:  CLINICAL IMPRESSION: The patient had increased pain today with significant reduction with STM and DN. Her R infraspinatus had a large twitch response and she got significant pain reduction after DN.  The patient has met 2/3 STGs. Continue working to [employer].   OBJECTIVE IMPAIRMENTS: increased fascial restrictions, impaired flexibility, and pain.   GOALS: Goals reviewed with patient? Yes  SHORT TERM GOALS: Target date: 10/03/22  The patient will be indep with HEP Baseline: initiated at eval Goal status:MET  2.  The patient will report pain at its worst at 6/10. Baseline: pain is still high (feeling a sharp knife like jab in R hip followed by pain) Goal status: NOT MET  3.  The patient will return to pilates class. Baseline: stopped x 2 weeks Goal status: MET-- she returned to pilates yesterday and is tight today  LONG TERM GOALS: Target date: 10/30/22  The patient will be indep with HEP progression. Baseline:  initiated at eval Goal status: INITIAL  2.  The patient will report no resting pain. Baseline: 3-4/10 Goal status: INITIAL  3.  The  patient will improve FOTO up to 68% Baseline: 63% Goal status: INITIAL  4.  The patient will be able to walk for 20 minutes nonstop without increase in pain. Baseline:  pain with prolonged walking Goal status: INITIAL  5.  The patient will be able to lift 10 lbs overhead to demo improved UE strength. Baseline:  Notes feeling weaker with overhead tasks. Goal status: INITIAL  PLAN:  PT FREQUENCY: 1x/week  PT DURATION: 8 weeks  PLANNED INTERVENTIONS: Therapeutic exercises, Therapeutic activity, Neuromuscular re-education, Balance training, Gait training, Patient/Family education, Self Care, Joint mobilization, Dry Needling, Electrical stimulation, Spinal mobilization, Cryotherapy, Moist heat, Taping, and Manual therapy  PLAN FOR NEXT SESSION: overhead strengthening, postural strengthening, Core activation. Discuss cervical region and consider DN + stretching L upper trap  Marcelis Wissner, PT 10/01/2022, 2:27 PM

## 2022-10-06 ENCOUNTER — Telehealth: Payer: Self-pay

## 2022-10-06 ENCOUNTER — Ambulatory Visit: Payer: Medicare Other | Attending: Sports Medicine | Admitting: Rehabilitative and Restorative Service Providers"

## 2022-10-06 ENCOUNTER — Encounter: Payer: Self-pay | Admitting: Rehabilitative and Restorative Service Providers"

## 2022-10-06 DIAGNOSIS — M5459 Other low back pain: Secondary | ICD-10-CM | POA: Diagnosis present

## 2022-10-06 DIAGNOSIS — M6281 Muscle weakness (generalized): Secondary | ICD-10-CM | POA: Insufficient documentation

## 2022-10-06 DIAGNOSIS — M542 Cervicalgia: Secondary | ICD-10-CM | POA: Diagnosis present

## 2022-10-06 NOTE — Telephone Encounter (Signed)
Patient came into office , she went to Physical Therapy and has neck and shoulder tightness, patient takes muscle relaxers for her hip with traMADol, and would like to see if she could take muscle relaxer without traMADol, please advise, thanks.

## 2022-10-06 NOTE — Therapy (Signed)
OUTPATIENT PHYSICAL THERAPY CERVICAL TREATMENT  Patient Name: Kristen Jacobs MRN: [id number] DOB:30-Aug-1945, 77 y.o., female Today's Date: 10/06/2022  END OF SESSION:  PT End of Session - 10/06/22 1020     Visit Number 6    Number of Visits 16    Date for PT Re-Evaluation 11/04/22    Authorization Type [employer] medicare    Progress Note Due on Visit 10    PT Start Time 1020    PT Stop Time 1100    PT Time Calculation (min) 40 min    Activity Tolerance Patient tolerated treatment well    Behavior During Therapy WFL for tasks assessed/performed             Past Medical History:  Diagnosis Date   Constipation    Hepatitis    History of UTI 11/22/2020   Recent event, fatigue w/o dysuria, symptoms resolved with antibiotic (name not recalled).  Urine POC dipstick normal today.    Joint pain    Past Surgical History:  Procedure Laterality Date   BREAST BIOPSY Bilateral    benign   CATARACT EXTRACTION Right 03/2020   LAPAROSCOPY     LIVER BIOPSY     OOPHORECTOMY     right    TONSILLECTOMY     age 65    Patient Active Problem List   Diagnosis Date Noted   Lumbar spondylosis 08/29/2022   COVID-19 virus infection 01/30/2022   Thyroid nodule 12/10/2021   Vasomotor rhinitis 12/10/2021   Insomnia disorder 10/31/2021   Fear of vaccinations 02/22/2021   History of malignant neoplasm of skin 11/22/2020   Age-related osteoporosis without fracture 11/22/2020   Major depression in remission (HCC) 11/22/2020   History of hepatitis C 04/05/2020   Cervical spondylosis with radiculopathy 02/21/2020   Rheumatoid factor positive 02/17/2020   Laryngopharyngeal reflux (LPR) 12/20/2019   Avulsion fracture of left talus 09/05/2019   Primary osteoarthritis of left knee 07/19/2019   Subacromial bursitis of left shoulder joint 07/19/2019   Bilateral wrist pain, 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 05/24/2019   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   Chronic right shoulder pain 11/12/2017   Pain of  right sacroiliac joint 09/10/2017   Chronic right hip pain 08/11/2017    PCP: Miguel Aschoff, MD REFERRING PROVIDER: Rodney Langton, MD REFERRING DIAG: [phone] (ICD-10-CM) - Cervical spondylosis with radiculopathy  THERAPY DIAG:  Cervicalgia  Muscle weakness (generalized)  Other low back pain  Rationale for Evaluation and Treatment: Rehabilitation ONSET DATE: 08/29/22 (referral date due to chronic issue)  SUBJECTIVE:  SUBJECTIVE STATEMENT: "It's just not a good day." She notes feeling "out of sorts" b/c of hip pain, R scapular pain, and overall tightness. She felt good after last session but pain returned. R hand dominant. PERTINENT HISTORY:  Rheumatoid arthritis PAIN:  Are you having pain? Yes: NPRS scale: Overall weakness today with moderate pain to 6/10 Pain location: lR hip, shoulder Pain description: achiness, pain worse with prolonged position Aggravating factors: walking Relieving factors: no pain sitting PRECAUTIONS: None WEIGHT BEARING RESTRICTIONS: No FALLS:  Has patient fallen in last 6 months? No LIVING ENVIRONMENT: Lives with: lives with their spouse Lives in: House/apartment PLOF: Independent PATIENT GOALS: reduce pain  OBJECTIVE:  (Measures in this section from initial evaluation unless otherwise noted) PATIENT SURVEYS:  FOTO=65% (goal is 68%) SENSATION: WFL POSTURE: No Significant postural limitations PALPATION: Tender along R lateral border of sacrum, R QL, R glut med, R piriformis CERVICAL ROM:  WFLs, although does note pain after laying prone from neck positioning. LOWER EXTREMITY STRENGTH: 5/5 B hip flexion, B knee extension, B knee flexion, and ankle DF. LUMBAR ROM:  WNLs ROM, pain with R lateral trunk lean on R side, tightness on R noted with  L sidebending. 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  [facility] Adult PT Treatment:                                                DATE: 10/06/22 Therapeutic Exercise: Supine Lumbar rocking x 10 reps Thoracic extension over towel roll Snow angels with towel roll x 10 reps Double knee to chest with rocking Sidelying Thoracic opening x 5 reps Prone Child's pose with lateral hand placement-- painful on L posterior arm Manual Therapy: STM lumbar spine and glut med, piriformis, STM R and L infraspinatus, upper trap, erector spinae musculature Trigger Point Dry-Needling  Treatment instructions: Expect mild to moderate muscle soreness. . Patient verbalized understanding of these instructions and education. Patient Consent Given: Yes Education handout provided: Previously provided Muscles treated: bilat upper trap and infraspinatus, R lumbar multifidi, and R pifiromis Treatment response/outcome: palpable lengthening and twitch response noted (in bilat upper trap and infraspinatus) Modalities: Stayed x 15 minutes ( no charge) after session for moist heat   [facility] Adult PT Treatment:                                                DATE: 10/01/22 Therapeutic Exercise: ER with red theraband x 10 reps x 2 sets Manual Therapy: STM upper trap/levator, lumbar QL, infraspinatus, rhomboids, glut medius and bilateral sacral borders Trigger Point Dry-Needling  Treatment instructions: Expect mild to moderate muscle soreness. S/S of pneumothorax if dry needled over a lung field, and to seek immediate medical attention should they occur. Patient verbalized understanding of these instructions and education. Patient Consent Given: Yes Education handout provided: Previously provided Muscles treated: R lumbar multifidi, R QL, R infraspinatus, bilat upper trap Treatment response/outcome:  twitch response and palpable lengthening, significant pain relief after R infraspinatus DN Self Care: Self STM with tennis ball on wall for R infraspinatus and rhomboids   [facility] Adult PT Treatment:  DATE: 09/24/22 Therapeutic Exercise: Prone on elbows to spinal twist x 3 reps R and L x 30 seconds Supine strap stretch hamstrings, adductors and IT Band R and L sides Foam rolling x R hip Standing Single limb stance Clock reach anteriorly 5 reps x 2 sets R and L Lateral clock reach x 5 reps x 2 sets R and L Self Care: Discussed continuing pilates and adding new strengthening into routine   PATIENT EDUCATION:  Education details: HEP Person educated: Patient Education method: Explanation, Demonstration, and Handouts Education comprehension: verbalized understanding and returned demonstration  HOME EXERCISE PROGRAM: Access Code: [email or web address] URL: [email or web address] Date: 10/01/2022 Prepared by: Margretta Ditty  Exercises - Standing with Forearms Thoracic Rotation  - 1 x daily - 7 x weekly - 1 sets - 10 reps - Standing Quadratus Lumborum Stretch with Doorway  - 1 x daily - 7 x weekly - 1 sets - 3 reps - 30 seconds hold - Standing Glute Med Mobilization with Small Ball on Wall  - 1 x daily - 7 x weekly - 1 sets - 10 reps - Cervical Retraction at Wall  - 1 x daily - 7 x weekly - 1 sets - 10 reps - Sidelying Open Book Thoracic Lumbar Rotation and Extension  - 2 x daily - 7 x weekly - 1 sets - 5 reps - Pigeon Pose  - 2 x daily - 7 x weekly - 1 sets - 10 reps - Single Leg Balance with Clock Reach  - 2 x daily - 7 x weekly - 1 sets - 10 reps - Split Squats Upright Trunk with Dumbbells (Quad Bias)  - 2 x daily - 7 x weekly - 1 sets - 10 reps - Shoulder External Rotation and Scapular Retraction with Resistance  - 2 x daily - 7 x weekly - 2 sets - 10 reps  ASSESSMENT:  CLINICAL IMPRESSION: The patient had increased pain today --  worked on gentle exercise and DN with STM to provide relief. Continue progressing to patient tolerance.  OBJECTIVE IMPAIRMENTS: increased fascial restrictions, impaired flexibility, and pain.   GOALS: Goals reviewed with patient? Yes  SHORT TERM GOALS: Target date: 10/03/22  The patient will be indep with HEP Baseline: initiated at eval Goal status:MET  2.  The patient will report pain at its worst at 6/10. Baseline: pain is still high (feeling a sharp knife like jab in R hip followed by pain) Goal status: NOT MET  3.  The patient will return to pilates class. Baseline: stopped x 2 weeks Goal status: MET-- she returned to pilates yesterday and is tight today  LONG TERM GOALS: Target date: 10/30/22  The patient will be indep with HEP progression. Baseline:  initiated at eval Goal status: INITIAL  2.  The patient will report no resting pain. Baseline: 3-4/10 Goal status: INITIAL  3.  The patient will improve FOTO up to 68% Baseline: 63% Goal status: INITIAL  4.  The patient will be able to walk for 20 minutes nonstop without increase in pain. Baseline:  pain with prolonged walking Goal status: INITIAL  5.  The patient will be able to lift 10 lbs overhead to demo improved UE strength. Baseline:  Notes feeling weaker with overhead tasks. Goal status: INITIAL  PLAN:  PT FREQUENCY: 1x/week  PT DURATION: 8 weeks  PLANNED INTERVENTIONS: Therapeutic exercises, Therapeutic activity, Neuromuscular re-education, Balance training, Gait training, Patient/Family education, Self Care, Joint mobilization, Dry Needling, Electrical stimulation, Spinal mobilization, Cryotherapy, Moist heat, Taping, and  Manual therapy  PLAN FOR NEXT SESSION: overhead strengthening, postural strengthening, Core activation. Discuss cervical region and consider DN + stretching L upper trap  Kortnie Stovall, PT 10/06/2022, 12:17 PM

## 2022-10-06 NOTE — Telephone Encounter (Signed)
Yes that is fine

## 2022-10-15 ENCOUNTER — Encounter: Payer: Self-pay | Admitting: Rehabilitative and Restorative Service Providers"

## 2022-10-15 ENCOUNTER — Ambulatory Visit: Payer: Medicare Other | Admitting: Rehabilitative and Restorative Service Providers"

## 2022-10-15 DIAGNOSIS — M5459 Other low back pain: Secondary | ICD-10-CM

## 2022-10-15 DIAGNOSIS — M542 Cervicalgia: Secondary | ICD-10-CM | POA: Diagnosis not present

## 2022-10-15 DIAGNOSIS — M6281 Muscle weakness (generalized): Secondary | ICD-10-CM

## 2022-10-15 NOTE — Therapy (Signed)
OUTPATIENT PHYSICAL THERAPY CERVICAL TREATMENT  Patient Name: Kristen Jacobs MRN: [id number] DOB:[birth year], 77 y.o., female Today's Date: 10/15/2022  END OF SESSION:  PT End of Session - 10/15/22 1024     Visit Number 7    Number of Visits 16    Date for PT Re-Evaluation 11/04/22    Authorization Type [employer] medicare    Authorization - Visit Number 7    Progress Note Due on Visit 10    PT Start Time 1023    PT Stop Time 1102    PT Time Calculation (min) 39 min    Activity Tolerance Patient tolerated treatment well    Behavior During Therapy WFL for tasks assessed/performed             Past Medical History:  Diagnosis Date   Constipation    Hepatitis    History of UTI 11/22/2020   Recent event, fatigue w/o dysuria, symptoms resolved with antibiotic (name not recalled).  Urine POC dipstick normal today.    Joint pain    Past Surgical History:  Procedure Laterality Date   BREAST BIOPSY Bilateral    benign   CATARACT EXTRACTION Right 03/2020   LAPAROSCOPY     LIVER BIOPSY     OOPHORECTOMY     right    TONSILLECTOMY     age 53    Patient Active Problem List   Diagnosis Date Noted   Lumbar spondylosis 08/29/2022   COVID-19 virus infection 01/30/2022   Thyroid nodule 12/10/2021   Vasomotor rhinitis 12/10/2021   Insomnia disorder 10/31/2021   Fear of vaccinations 02/22/2021   History of malignant neoplasm of skin 11/22/2020   Age-related osteoporosis without fracture 11/22/2020   Major depression in remission (HCC) 11/22/2020   History of hepatitis C 04/05/2020   Cervical spondylosis with radiculopathy 02/21/2020   Rheumatoid factor positive 02/17/2020   Laryngopharyngeal reflux (LPR) 12/20/2019   Avulsion fracture of left talus 09/05/2019   Primary osteoarthritis of left knee 07/19/2019   Subacromial bursitis of left shoulder joint 07/19/2019   Bilateral wrist pain, 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 05/24/2019   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   Chronic right  shoulder pain 11/12/2017   Pain of right sacroiliac joint 09/10/2017   Chronic right hip pain 08/11/2017   PCP: Miguel Aschoff, MD REFERRING PROVIDER: Rodney Langton, MD REFERRING DIAG: [phone] (ICD-10-CM) - Cervical spondylosis with radiculopathy  THERAPY DIAG:  Cervicalgia  Muscle weakness (generalized)  Other low back pain  Rationale for Evaluation and Treatment: Rehabilitation ONSET DATE: 08/29/22 (referral date due to chronic issue)  SUBJECTIVE:  SUBJECTIVE STATEMENT: The patient started yoga on Monday to get to 150 minutes of exercise per day. She had a bad day last PT visit-- she gets those 1x/week. Meds and rest tend to reduce pain.  R hand dominant. PERTINENT HISTORY:  Rheumatoid arthritis PAIN:  Are you having pain? Yes: NPRS scale: Overall weakness today with moderate pain to 6/10 Pain location: lR hip, shoulder Pain description: achiness, pain worse with prolonged position Aggravating factors: walking Relieving factors: no pain sitting PRECAUTIONS: None WEIGHT BEARING RESTRICTIONS: No FALLS:  Has patient fallen in last 6 months? No LIVING ENVIRONMENT: Lives with: lives with their spouse Lives in: House/apartment PLOF: Independent PATIENT GOALS: reduce pain  OBJECTIVE:  (Measures in this section from initial evaluation unless otherwise noted) PATIENT SURVEYS:  FOTO=65% (goal is 68%) SENSATION: WFL POSTURE: No Significant postural limitations PALPATION: Tender along R lateral border of sacrum, R QL, R glut med, R piriformis CERVICAL ROM:  WFLs, although does note pain after laying prone from neck positioning. LOWER EXTREMITY STRENGTH: 5/5 B hip flexion, B knee extension, B knee flexion, and ankle DF. LUMBAR ROM:  WNLs ROM, pain with R lateral trunk lean  on R side, tightness on R noted with L sidebending. 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  [facility] Adult PT Treatment:                                                DATE: 10/15/22 Therapeutic Exercise: Supine Piriformis stretching supine R and L sides IT Band stretch R and L Lumbar rocking Standing 1# overhead raises W x 12 reps with 1# weight L x 12 reps with 1# weights Prone (BLACKBURN scapular stabilizers) Ts x 12 reps Ts with ER x 12 reps Ys x 12 reps Ys with ER x 12 reps Goal post with thumbs up lift x 12 reps Shoulder extension x 12 reps   [facility] Adult PT Treatment:                                                DATE: 10/06/22 Therapeutic Exercise: Supine Lumbar rocking x 10 reps Thoracic extension over towel roll Snow angels with towel roll x 10 reps Double knee to chest with rocking Sidelying Thoracic opening x 5 reps Prone Child's pose with lateral hand placement-- painful on L posterior arm Manual Therapy: STM lumbar spine and glut med, piriformis, STM R and L infraspinatus, upper trap, erector spinae musculature Trigger Point Dry-Needling  Treatment instructions: Expect mild to moderate muscle soreness. . Patient verbalized understanding of these instructions and education. Patient Consent Given: Yes Education handout provided: Previously provided Muscles treated: bilat upper trap and infraspinatus, R lumbar multifidi, and R pifiromis Treatment response/outcome: palpable lengthening and twitch response noted (in bilat upper trap and infraspinatus) Modalities: Stayed x 15 minutes ( no charge) after session for moist heat   [facility] Adult PT Treatment:  DATE: 10/01/22 Therapeutic Exercise: ER with red theraband x 10 reps x 2 sets Manual Therapy: STM upper trap/levator, lumbar QL, infraspinatus,  rhomboids, glut medius and bilateral sacral borders Trigger Point Dry-Needling  Treatment instructions: Expect mild to moderate muscle soreness. S/S of pneumothorax if dry needled over a lung field, and to seek immediate medical attention should they occur. Patient verbalized understanding of these instructions and education. Patient Consent Given: Yes Education handout provided: Previously provided Muscles treated: R lumbar multifidi, R QL, R infraspinatus, bilat upper trap Treatment response/outcome: twitch response and palpable lengthening, significant pain relief after R infraspinatus DN Self Care: Self STM with tennis ball on wall for R infraspinatus and rhomboids  [facility] Adult PT Treatment:                                                DATE: 09/24/22 Therapeutic Exercise: Prone on elbows to spinal twist x 3 reps R and L x 30 seconds Supine strap stretch hamstrings, adductors and IT Band R and L sides Foam rolling x R hip Standing Single limb stance Clock reach anteriorly 5 reps x 2 sets R and L Lateral clock reach x 5 reps x 2 sets R and L Self Care: Discussed continuing pilates and adding new strengthening into routine   PATIENT EDUCATION:  Education details: HEP Person educated: Patient Education method: Explanation, Demonstration, and Handouts Education comprehension: verbalized understanding and returned demonstration  HOME EXERCISE PROGRAM: Access Code: [email or web address] URL: [email or web address] Date: 10/01/2022 Prepared by: Margretta Ditty  Exercises - Standing with Forearms Thoracic Rotation  - 1 x daily - 7 x weekly - 1 sets - 10 reps - Standing Quadratus Lumborum Stretch with Doorway  - 1 x daily - 7 x weekly - 1 sets - 3 reps - 30 seconds hold - Standing Glute Med Mobilization with Small Ball on Wall  - 1 x daily - 7 x weekly - 1 sets - 10 reps - Cervical Retraction at Wall  - 1 x daily - 7 x weekly - 1 sets - 10 reps - Sidelying Open Book Thoracic Lumbar  Rotation and Extension  - 2 x daily - 7 x weekly - 1 sets - 5 reps - Pigeon Pose  - 2 x daily - 7 x weekly - 1 sets - 10 reps - Single Leg Balance with Clock Reach  - 2 x daily - 7 x weekly - 1 sets - 10 reps - Split Squats Upright Trunk with Dumbbells (Quad Bias)  - 2 x daily - 7 x weekly - 1 sets - 10 reps - Shoulder External Rotation and Scapular Retraction with Resistance  - 2 x daily - 7 x weekly - 2 sets - 10 reps  ASSESSMENT:  CLINICAL IMPRESSION: The patient tolerating exercise well today. We progressed strengthening and reprinted HEP to provide time to complete. Plan to work to [employer].   OBJECTIVE IMPAIRMENTS: increased fascial restrictions, impaired flexibility, and pain.   GOALS: Goals reviewed with patient? Yes  SHORT TERM GOALS: Target date: 10/03/22  The patient will be indep with HEP Baseline: initiated at eval Goal status:MET  2.  The patient will report pain at its worst at 6/10. Baseline: pain is still high (feeling a sharp knife like jab in R hip followed by pain) Goal status: NOT MET  3.  The patient will return to pilates  class. Baseline: stopped x 2 weeks Goal status: MET-- she returned to pilates yesterday and is tight today  LONG TERM GOALS: Target date: 10/30/22  The patient will be indep with HEP progression. Baseline:  initiated at eval Goal status: INITIAL  2.  The patient will report no resting pain. Baseline: 3-4/10 Goal status: INITIAL  3.  The patient will improve FOTO up to 68% Baseline: 63% Goal status: INITIAL  4.  The patient will be able to walk for 20 minutes nonstop without increase in pain. Baseline:  pain with prolonged walking Goal status:NOT MET--patient does not   5.  The patient will be able to lift 10 lbs overhead to demo improved UE strength. Baseline:  Notes feeling weaker with overhead tasks. Goal status: MET  PLAN:  PT FREQUENCY: 1x/week  PT DURATION: 8 weeks  PLANNED INTERVENTIONS: Therapeutic exercises,  Therapeutic activity, Neuromuscular re-education, Balance training, Gait training, Patient/Family education, Self Care, Joint mobilization, Dry Needling, Electrical stimulation, Spinal mobilization, Cryotherapy, Moist heat, Taping, and Manual therapy  PLAN FOR NEXT SESSION: overhead strengthening, postural strengthening, Core activation. Discuss cervical region and consider DN + stretching L upper trap  Jadalyn Oliveri, PT 10/15/2022, 10:25 AM

## 2022-10-22 ENCOUNTER — Encounter: Payer: Medicare Other | Admitting: Rehabilitative and Restorative Service Providers"

## 2022-10-29 ENCOUNTER — Encounter: Payer: Self-pay | Admitting: Rehabilitative and Restorative Service Providers"

## 2022-10-29 ENCOUNTER — Ambulatory Visit: Payer: Medicare Other | Admitting: Rehabilitative and Restorative Service Providers"

## 2022-10-29 DIAGNOSIS — M5459 Other low back pain: Secondary | ICD-10-CM

## 2022-10-29 DIAGNOSIS — M6281 Muscle weakness (generalized): Secondary | ICD-10-CM

## 2022-10-29 DIAGNOSIS — M542 Cervicalgia: Secondary | ICD-10-CM | POA: Diagnosis not present

## 2022-10-29 NOTE — Therapy (Signed)
OUTPATIENT PHYSICAL THERAPY CERVICAL TREATMENT  Patient Name: Kristen Jacobs MRN: [id number] DOB:Feb 12, 1946, 77 y.o., female Today's Date: 10/29/2022  END OF SESSION:  PT End of Session - 10/29/22 1020     Visit Number 8    Number of Visits 16    Date for PT Re-Evaluation 11/04/22    Authorization Type [employer] medicare    Authorization - Visit Number 8    Progress Note Due on Visit 10    PT Start Time 1020    PT Stop Time 1100    PT Time Calculation (min) 40 min    Activity Tolerance Patient tolerated treatment well    Behavior During Therapy WFL for tasks assessed/performed             Past Medical History:  Diagnosis Date   Constipation    Hepatitis    History of UTI 11/22/2020   Recent event, fatigue w/o dysuria, symptoms resolved with antibiotic (name not recalled).  Urine POC dipstick normal today.    Joint pain    Past Surgical History:  Procedure Laterality Date   BREAST BIOPSY Bilateral    benign   CATARACT EXTRACTION Right 03/2020   LAPAROSCOPY     LIVER BIOPSY     OOPHORECTOMY     right    TONSILLECTOMY     age 24    Patient Active Problem List   Diagnosis Date Noted   Lumbar spondylosis 08/29/2022   COVID-19 virus infection 01/30/2022   Thyroid nodule 12/10/2021   Vasomotor rhinitis 12/10/2021   Insomnia disorder 10/31/2021   Fear of vaccinations 02/22/2021   History of malignant neoplasm of skin 11/22/2020   Age-related osteoporosis without fracture 11/22/2020   Major depression in remission (HCC) 11/22/2020   History of hepatitis C 04/05/2020   Cervical spondylosis with radiculopathy 02/21/2020   Rheumatoid factor positive 02/17/2020   Laryngopharyngeal reflux (LPR) 12/20/2019   Avulsion fracture of left talus 09/05/2019   Primary osteoarthritis of left knee 07/19/2019   Subacromial bursitis of left shoulder joint 07/19/2019   Bilateral wrist pain, 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 05/24/2019   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   Chronic right  shoulder pain 11/12/2017   Pain of right sacroiliac joint 09/10/2017   Chronic right hip pain 08/11/2017   PCP: Miguel Aschoff, MD REFERRING PROVIDER: Rodney Langton, MD REFERRING DIAG: [phone] (ICD-10-CM) - Cervical spondylosis with radiculopathy  THERAPY DIAG:  Cervicalgia  Muscle weakness (generalized)  Other low back pain  Rationale for Evaluation and Treatment: Rehabilitation ONSET DATE: 08/29/22 (referral date due to chronic issue)  SUBJECTIVE:  SUBJECTIVE STATEMENT: The patient reports that when she sits her R hip begins to hurt. She had a bad day last week and had to cancel-- pain was too great to drive to therapy.  R hand dominant. PERTINENT HISTORY:  Rheumatoid arthritis PAIN:  Are you having pain? Yes: NPRS scale: 6 in neck and 2-3 in hips/10 Pain location: neck and R hip Pain description: achiness, pain worse with prolonged position Aggravating factors: walking Relieving factors: no pain sitting PRECAUTIONS: None WEIGHT BEARING RESTRICTIONS: No FALLS:  Has patient fallen in last 6 months? No LIVING ENVIRONMENT: Lives with: lives with their spouse Lives in: House/apartment PLOF: Independent PATIENT GOALS: reduce pain  OBJECTIVE:  (Measures in this section from initial evaluation unless otherwise noted) PATIENT SURVEYS:  FOTO=65% (goal is 68%) SENSATION: WFL POSTURE: No Significant postural limitations PALPATION: Tender along R lateral border of sacrum, R QL, R glut med, R piriformis CERVICAL ROM:  WFLs, although does note pain after laying prone from neck positioning. LOWER EXTREMITY STRENGTH: 5/5 B hip flexion, B knee extension, B knee flexion, and ankle DF. LUMBAR ROM:  WNLs ROM, pain with R lateral trunk lean on R side, tightness on R noted with L  sidebending. 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  [facility] Adult PT Treatment:                                                DATE: 10/29/22 Therapeutic Exercise: Sidelying Spinal twist open book Foam roller pelvic mobility Hip hiking and depression x 12 reps Standing QL and lateral IT stretch--painful on the right Supine Head nod x 10 reps with head supported on coregous ball Chin tuck on coregous ball AROM cervical rotation  Quadriped Lateral child's pose for trunk opening Manual Therapy: Suboccipital release supine Manual cervical traction with 8 second holds intermittent x 5 minutes Passive end range cervical flexion with rotation *initially limited with R rotation due to suboccipital spasm and then improved with manual Coregous ball passive traction x 4 minutes with instruction for home Self Care: Discussed self management of neck and low back tightness, using ball to release neck tightness  [facility] Adult PT Treatment:                                                DATE: 10/15/22 Therapeutic Exercise: Supine Piriformis stretching supine R and L sides IT Band stretch R and L Lumbar rocking Standing 1# overhead raises W x 12 reps with 1# weight L x 12 reps with 1# weights Prone (BLACKBURN scapular stabilizers) Ts x 12 reps Ts with ER x 12 reps Ys x 12 reps Ys with ER x 12 reps Goal post with thumbs up lift x 12 reps Shoulder extension x 12 reps   [facility] Adult PT Treatment:                                                DATE: 10/06/22 Therapeutic Exercise: Supine Lumbar rocking x 10 reps  Thoracic extension over towel roll Snow angels with towel roll x 10 reps Double knee to chest with rocking Sidelying Thoracic opening x 5 reps Prone Child's pose with lateral hand placement-- painful on L posterior arm Manual Therapy: STM lumbar spine and glut  med, piriformis, STM R and L infraspinatus, upper trap, erector spinae musculature Trigger Point Dry-Needling  Treatment instructions: Expect mild to moderate muscle soreness. . Patient verbalized understanding of these instructions and education. Patient Consent Given: Yes Education handout provided: Previously provided Muscles treated: bilat upper trap and infraspinatus, R lumbar multifidi, and R pifiromis Treatment response/outcome: palpable lengthening and twitch response noted (in bilat upper trap and infraspinatus) Modalities: Stayed x 15 minutes ( no charge) after session for moist heat   PATIENT EDUCATION:  Education details: HEP Person educated: Patient Education method: Programmer, multimedia, Facilities manager, and Handouts Education comprehension: verbalized understanding and returned demonstration  HOME EXERCISE PROGRAM: Access Code: [email or web address] URL: [email or web address] Date: 10/01/2022 Prepared by: Margretta Ditty  Exercises - Standing with Forearms Thoracic Rotation  - 1 x daily - 7 x weekly - 1 sets - 10 reps - Standing Quadratus Lumborum Stretch with Doorway  - 1 x daily - 7 x weekly - 1 sets - 3 reps - 30 seconds hold - Standing Glute Med Mobilization with Small Ball on Wall  - 1 x daily - 7 x weekly - 1 sets - 10 reps - Cervical Retraction at Wall  - 1 x daily - 7 x weekly - 1 sets - 10 reps - Sidelying Open Book Thoracic Lumbar Rotation and Extension  - 2 x daily - 7 x weekly - 1 sets - 5 reps - Pigeon Pose  - 2 x daily - 7 x weekly - 1 sets - 10 reps - Single Leg Balance with Clock Reach  - 2 x daily - 7 x weekly - 1 sets - 10 reps - Split Squats Upright Trunk with Dumbbells (Quad Bias)  - 2 x daily - 7 x weekly - 1 sets - 10 reps - Shoulder External Rotation and Scapular Retraction with Resistance  - 2 x daily - 7 x weekly - 2 sets - 10 reps  ASSESSMENT:  CLINICAL IMPRESSION: The patient has increased pain today. She has good days and can tolerate exercise  well, but on bad days, she has R hip and neck pain and typically takes flexeril and tramadol and goes to bed for 3 hours. This helps reduce pain. PT to continue to work on LTGs. Will check LTGs next session and determine plan of care.   OBJECTIVE IMPAIRMENTS: increased fascial restrictions, impaired flexibility, and pain.   GOALS: Goals reviewed with patient? Yes  SHORT TERM GOALS: Target date: 10/03/22  The patient will be indep with HEP Baseline: initiated at eval Goal status:MET  2.  The patient will report pain at its worst at 6/10. Baseline: pain is still high (feeling a sharp knife like jab in R hip followed by pain) Goal status: NOT MET  3.  The patient will return to pilates class. Baseline: stopped x 2 weeks Goal status: MET-- she returned to pilates yesterday and is tight today  LONG TERM GOALS: Target date: 10/30/22  The patient will be indep with HEP progression. Baseline:  initiated at eval Goal status: INITIAL  2.  The patient will report no resting pain. Baseline: 3-4/10 Goal status: INITIAL  3.  The patient will improve FOTO up to 68% Baseline: 63% Goal status: INITIAL  4.  The patient will  be able to walk for 20 minutes nonstop without increase in pain. Baseline:  pain with prolonged walking Goal status:NOT MET--patient does not do   5.  The patient will be able to lift 10 lbs overhead to demo improved UE strength. Baseline:  Notes feeling weaker with overhead tasks. Goal status: MET  PLAN:  PT FREQUENCY: 1x/week  PT DURATION: 8 weeks  PLANNED INTERVENTIONS: Therapeutic exercises, Therapeutic activity, Neuromuscular re-education, Balance training, Gait training, Patient/Family education, Self Care, Joint mobilization, Dry Needling, Electrical stimulation, Spinal mobilization, Cryotherapy, Moist heat, Taping, and Manual therapy  PLAN FOR NEXT SESSION: overhead strengthening, postural strengthening, Core activation. Discuss cervical region and  consider DN + stretching L upper trap CHECK GOALS   Vietta Bonifield, PT 10/29/2022, 10:20 AM

## 2022-11-11 ENCOUNTER — Encounter: Payer: Self-pay | Admitting: Internal Medicine

## 2022-11-11 ENCOUNTER — Ambulatory Visit (INDEPENDENT_AMBULATORY_CARE_PROVIDER_SITE_OTHER): Payer: Medicare Other

## 2022-11-11 ENCOUNTER — Ambulatory Visit (INDEPENDENT_AMBULATORY_CARE_PROVIDER_SITE_OTHER): Payer: Medicare Other | Admitting: Internal Medicine

## 2022-11-11 VITALS — BP 91/57 | HR 64 | Temp 97.6°F | Ht 65.0 in | Wt 139.5 lb

## 2022-11-11 DIAGNOSIS — F325 Major depressive disorder, single episode, in full remission: Secondary | ICD-10-CM

## 2022-11-11 DIAGNOSIS — R499 Unspecified voice and resonance disorder: Secondary | ICD-10-CM | POA: Diagnosis not present

## 2022-11-11 DIAGNOSIS — M810 Age-related osteoporosis without current pathological fracture: Secondary | ICD-10-CM | POA: Diagnosis not present

## 2022-11-11 DIAGNOSIS — F17210 Nicotine dependence, cigarettes, uncomplicated: Secondary | ICD-10-CM

## 2022-11-11 DIAGNOSIS — G4700 Insomnia, unspecified: Secondary | ICD-10-CM

## 2022-11-11 DIAGNOSIS — Z87891 Personal history of nicotine dependence: Secondary | ICD-10-CM | POA: Insufficient documentation

## 2022-11-11 DIAGNOSIS — M79672 Pain in left foot: Secondary | ICD-10-CM

## 2022-11-11 DIAGNOSIS — Z91030 Bee allergy status: Secondary | ICD-10-CM

## 2022-11-11 DIAGNOSIS — R413 Other amnesia: Secondary | ICD-10-CM

## 2022-11-11 DIAGNOSIS — K219 Gastro-esophageal reflux disease without esophagitis: Secondary | ICD-10-CM

## 2022-11-11 DIAGNOSIS — Z0000 Encounter for general adult medical examination without abnormal findings: Secondary | ICD-10-CM

## 2022-11-11 DIAGNOSIS — M818 Other osteoporosis without current pathological fracture: Secondary | ICD-10-CM

## 2022-11-11 DIAGNOSIS — R6889 Other general symptoms and signs: Secondary | ICD-10-CM

## 2022-11-11 DIAGNOSIS — M79671 Pain in right foot: Secondary | ICD-10-CM

## 2022-11-11 MED ORDER — EPINEPHRINE 0.3 MG/0.3ML IJ SOAJ
0.3000 mg | INTRAMUSCULAR | 2 refills | Status: AC | PRN
Start: 2022-11-11 — End: ?

## 2022-11-11 NOTE — Assessment & Plan Note (Signed)
Wishes to proceed with bone density scan on recommendation of her complimentary medicine provider (telehealth in [place]).  Order placed.

## 2022-11-11 NOTE — Assessment & Plan Note (Signed)
Annual screening CT ordered

## 2022-11-11 NOTE — Assessment & Plan Note (Signed)
Troubled by pain at base of each 5th metatarsal.  No erythema, but hurts like bunions.  Desires podiatric opinion for footwear.  Referral placed.

## 2022-11-11 NOTE — Progress Notes (Signed)
67M f/u for Ms. Alguire, who is doing fairly well. Concerns are outlined in problem-based documentation below.    BP (!) 91/57 (BP Location: Left Arm, Patient Position: Sitting, Cuff Size: Small)   Pulse 64   Temp 97.6 F (36.4 C) (Oral)   Ht 5\' 5"  (1.651 m)   Wt 139 lb 8 oz (63.3 kg)   SpO2 99%   BMI 23.21 kg/m  (BP runs lowish; she hadn not had food or drink this morning; asymptomatic) Youthful appearance, slender habitus. Neck no adenopathy or nodularity.  Posterior oropharynx smooth pink w/o exudate, no cobblestoning.  Voice with mild but noticeable occasional strain/hoarseness.  Feet with bilateral prominence of 5th MTPs, no erythema.  Assessment and plan:  Voice complaint Voice concerns - occasionally raspy, poor voice support when trying to speak loudly; ENT referral in past for problem, reflux dxed; reduction in coffee recommended.  She has cut back with no improvement.  Sometimes feels sinus congestion and drainage down back of throat.  Nose runs clear.  Feels need to clear her throat but nothing is produced.  She queries allergies (declines to try antihistamine) - advised to try inhaled aromatic therapies and to consider saline rinses.   She will return to Dr. Pollyann Kennedy for next steps..   Major depression in remission [facility]) Doing well with ongoing citalopram 20 mg tolerated well.  Continue to monitor.  Insomnia disorder Doing well with trazadone 100 mg nightly, tolerated well, no uncomfortable side effects.  Continue to monitor.   Age-related osteoporosis without fracture Wishes to proceed with bone density scan on recommendation of her complimentary medicine provider (telehealth in [place]).  Order placed.    Laryngopharyngeal reflux (LPR) See "voice complaint"  History of smoking Annual screening CT ordered  Forgetfulness Observed today - didn't recall having had a laryngeal exam from ENT last year or a screening lung CT last year. Monitor.   Bilateral foot pain Troubled  by pain at base of each 5th metatarsal.  No erythema, but hurts like bunions.  Desires podiatric opinion for footwear.  Referral placed.    Refills of tramadol and robaxin should be completed by the prescribing doctor.

## 2022-11-11 NOTE — Assessment & Plan Note (Signed)
Voice concerns - occasionally raspy, poor voice support when trying to speak loudly; ENT referral in past for problem, reflux dxed; reduction in coffee recommended.  She has cut back with no improvement.  Sometimes feels sinus congestion and drainage down back of throat.  Nose runs clear.  Feels need to clear her throat but nothing is produced.  She queries allergies (declines to try antihistamine) - advised to try inhaled aromatic therapies and to consider saline rinses.   She will return to Dr. Pollyann Kennedy for next steps.Marland Kitchen

## 2022-11-11 NOTE — Progress Notes (Signed)
Subjective:   Kristen Jacobs is a 77 y.o. female who presents for an Initial Medicare Annual Wellness Visit.  Visit Complete: In person  Patient Medicare AWV questionnaire was completed by the patient on 11/11/2022; I have confirmed that all information answered by patient is correct and no changes since this date.  Review of Systems    Defer to PCP       Objective:    Today's Vitals   11/11/22 1316 11/11/22 1317  BP: (!) 91/57   Pulse: 64   Temp: 97.6 F (36.4 C)   TempSrc: Oral   SpO2: 99%   Weight: 139 lb 8 oz (63.3 kg)   Height: 5\' 5"  (1.651 m)   PainSc:  0-No pain   Body mass index is 23.21 kg/m.     11/11/2022    1:18 PM 11/11/2022   11:33 AM 09/05/2022   12:00 PM 05/08/2022   10:46 AM 10/31/2021   11:09 AM 07/16/2021   10:33 AM 01/24/2021   10:42 AM  Advanced Directives  Does Patient Have a Medical Advance Directive? Yes Yes Yes Yes Yes Yes No  Type of Estate agent of [place];Living will Healthcare Power of [place];Living will Healthcare Power of [place];Living will Healthcare Power of [place];Living will Healthcare Power of [place];Living will Healthcare Power of [place];Living will   Does patient want to make changes to medical advance directive?  No - Patient declined  No - Patient declined No - Patient declined    Copy of Healthcare Power of Attorney in Chart? No - copy requested No - copy requested  No - copy requested No - copy requested    Would patient like information on creating a medical advance directive?       No - Patient declined    Current Medications (verified) Outpatient Encounter Medications as of 11/11/2022  Medication Sig   Ascorbic Acid (VITAMIN C) 100 MG CHEW Chew by mouth.   b complex vitamins capsule Take 1 capsule by mouth daily.   Cholecalciferol (VITAMIN D3 PO) Take 5,000 Units by mouth daily.   citalopram (CELEXA) 20 MG tablet Take 1 tablet (20 mg total) by mouth daily.   EPINEPHrine (EPIPEN 2-PAK) 0.3 mg/0.3  mL IJ SOAJ injection Inject 0.3 mg into the muscle as needed for anaphylaxis.   Glucosamine 500 MG CAPS Take 1 capsule by mouth. (Wild Shrimp Glucosamine)   hydroxychloroquine (PLAQUENIL) 200 MG tablet TAKE 1 TABLET BY MOUTH TWICE  DAILY MONDAY THROUGH FRIDAY  ONLY. NONE ON SATURDAY OR SUNDAY   ketoconazole (NIZORAL) 2 % shampoo Apply topically 3 (three) times a week.   methocarbamol (ROBAXIN) 500 MG tablet Take 1 tablet (500 mg total) by mouth 3 (three) times daily. (Muscle relaxer)   MISC NATURAL PRODUCTS PO Take 3 capsules by mouth daily. Bone Strength: Vitamin D3, Vitamin K1, Vitamin K2, Calcium, MAgnesium, Strontium, Vanadium   Omega-3 Fatty Acids (FISH OIL) 1200 MG CAPS Take 1 capsule by mouth daily.   OVER THE COUNTER MEDICATION at bedtime. Triphala (anti inflammatory)   traMADol (ULTRAM) 50 MG tablet Take 1 tablet (50 mg total) by mouth every 12 (twelve) hours as needed.   traZODone (DESYREL) 100 MG tablet Take 1 tablet (100 mg total) by mouth at bedtime.   Zinc 10 MG LOZG Use as directed in the mouth or throat.   No facility-administered encounter medications on file as of 11/11/2022.    Allergies (verified) Other and Sulfa antibiotics   History: Past Medical History:  Diagnosis Date  Constipation    Hepatitis    History of UTI 11/22/2020   Recent event, fatigue w/o dysuria, symptoms resolved with antibiotic (name not recalled).  Urine POC dipstick normal today.    Joint pain    Past Surgical History:  Procedure Laterality Date   BREAST BIOPSY Bilateral    benign   CATARACT EXTRACTION Right 03/2020   LAPAROSCOPY     LIVER BIOPSY     OOPHORECTOMY     right    TONSILLECTOMY     age 78    Family History  Problem Relation Age of Onset   Dementia Mother    Cancer Father    Bipolar disorder Sister    Leukemia Brother    Healthy Son    Asthma Son    Healthy Son    Breast cancer Neg Hx    Social History   Socioeconomic History   Marital status: Married    Spouse  name: Not on file   Number of children: Not on file   Years of education: Not on file   Highest education level: Not on file  Occupational History   Not on file  Tobacco Use   Smoking status: Former    Packs/day: 3.00    Years: 26.00    Additional pack years: 0.00    Total pack years: 78.00    Types: Cigarettes    Start date: 1964    Quit date: 1990    Years since quitting: 34.5    Passive exposure: Current (Husband smokes cigars)   Smokeless tobacco: Never  Vaping Use   Vaping Use: Never used  Substance and Sexual Activity   Alcohol use: Not Currently    Comment: quit 1990   Drug use: Not Currently    Types: Marijuana   Sexual activity: Not on file  Other Topics Concern   Not on file  Social History Narrative   From [place], first generation [place], 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  Moved to [facility] in late 2010's.  Enjoys healthful living, Mediterranean diet, staying active.  First husband passed away due to complications from diabetes.  She has since remarried.   Social Determinants of Health   Financial Resource Strain: Low Risk  (11/11/2022)   Overall Financial Resource Strain (CARDIA)    Difficulty of Paying Living Expenses: Not very hard  Food Insecurity: No Food Insecurity (11/11/2022)   Hunger Vital Sign    Worried About Running Out of Food in the Last Year: Never true    Ran Out of Food in the Last Year: Never true  Transportation Needs: No Transportation Needs (11/11/2022)   PRAPARE - Administrator, Civil Service (Medical): No    Lack of Transportation (Non-Medical): No  Physical Activity: Sufficiently Active (11/11/2022)   Exercise Vital Sign    Days of Exercise per Week: 3 days    Minutes of Exercise per Session: 50 min  Stress: No Stress Concern Present (11/11/2022)   [employer] of Occupational Health - Occupational Stress Questionnaire    Feeling of Stress : Only a little  Social Connections: Socially Integrated (11/11/2022)   Social  Connection and Isolation Panel [NHANES]    Frequency of Communication with Friends and Family: More than three times a week    Frequency of Social Gatherings with Friends and Family: More than three times a week    Attends Religious Services: More than 4 times per year    Active Member of [employer] or Organizations: Yes  Attends Engineer, structural: More than 4 times per year    Marital Status: Married    Tobacco Counseling Counseling given: Not Answered   Clinical Intake:  Pre-visit preparation completed: Yes  Pain : No/denies pain Pain Score: 0-No pain     Nutritional Risks: None Diabetes: No  How often do you need to have someone help you when you read instructions, pamphlets, or other written materials from your doctor or pharmacy?: 1 - Never What is the last grade level you completed in school?: college  Interpreter Needed?: No  Information entered by :: Othel Dicostanzo,cma   Activities of Daily Living    11/11/2022    1:18 PM 11/11/2022   10:33 AM  In your present state of health, do you have any difficulty performing the following activities:  Hearing? 0 0  Vision? 0 0  Difficulty concentrating or making decisions? 0 0  Walking or climbing stairs? 0 0  Dressing or bathing? 0 0  Doing errands, shopping? 0 0    Patient Care Team: Miguel Aschoff, MD as PCP - General (Internal Medicine)  Indicate any recent Medical Services you may have received from other than [facility] providers in the past year (date may be approximate).     Assessment:   This is a routine wellness examination for [place].  Hearing/Vision screen No results found.  Dietary issues and exercise activities discussed:     Goals Addressed   None   Depression Screen    11/11/2022    1:18 PM 05/08/2022   10:47 AM 10/31/2021   11:11 AM 07/16/2021   10:33 AM 11/22/2020   10:58 AM  PHQ 2/9 Scores  PHQ - 2 Score 0 0 0 0 0  PHQ- 9 Score    0 0    Fall Risk    11/11/2022    1:18 PM  11/11/2022   10:33 AM 05/08/2022   10:46 AM 10/31/2021   11:10 AM 07/16/2021   10:33 AM  Fall Risk   Falls in the past year? 0 0 0 0 0  Number falls in past yr: 0 0 0 0 0  Injury with Fall? 0 0 0 0 0  Risk for fall due to : No Fall Risks    No Fall Risks  Follow up Falls evaluation completed;Falls prevention discussed Falls evaluation completed Falls evaluation completed Falls evaluation completed Falls evaluation completed;Falls prevention discussed    MEDICARE RISK AT HOME:   TIMED UP AND GO:  Was the test performed? No    Cognitive Function:        11/11/2022    1:18 PM  6CIT Screen  What Year? 0 points  What month? 0 points  What time? 0 points  Count back from 20 0 points  Months in reverse 0 points  Repeat phrase 0 points  Total Score 0 points    Immunizations Immunization History  Administered Date(s) Administered   Moderna Sars-Covid-2 Vaccination 11/17/2019, 12/05/2019, 01/02/2020    TDAP status: Up to date  Flu Vaccine status: Up to date  Pneumococcal vaccine status: Up to date  Covid-19 vaccine status: Completed vaccines  Qualifies for Shingles Vaccine? No   Zostavax completed No   Shingrix Completed?: No.    Education has been provided regarding the importance of this vaccine. Patient has been advised to call insurance company to determine out of pocket expense if they have not yet received this vaccine. Advised may also receive vaccine at local pharmacy or Health Dept.  Verbalized acceptance and understanding.  Screening Tests Health Maintenance  Topic Date Due   Lung Cancer Screening  11/26/2022   Medicare Annual Wellness (AWV)  11/11/2023   DEXA SCAN  Completed   Hepatitis C Screening  Completed   HPV VACCINES  Aged Out   DTaP/Tdap/Td  Discontinued   Pneumonia Vaccine 57+ Years old  Discontinued   INFLUENZA VACCINE  Discontinued   Colonoscopy  Discontinued   COVID-19 Vaccine  Discontinued   Zoster Vaccines- Shingrix  Discontinued    Health  Maintenance  Health Maintenance Due  Topic Date Due   Lung Cancer Screening  11/26/2022    Colorectal cancer screening: Type of screening: Colonoscopy. Completed 05/08/2022. Repeat every 0 years    Bone Density status: Ordered 11/11/2022. Pt provided with contact info and advised to call to schedule appt.  Lung Cancer Screening: (Low Dose CT Chest recommended if Age 53-80 years, 20 pack-year currently smoking OR have quit w/in 15years.) does not qualify.   Lung Cancer Screening Referral: N/A  Additional Screening:  Hepatitis C Screening: does not qualify; Completed 07/01/2022  Vision Screening: Recommended annual ophthalmology exams for early detection of glaucoma and other disorders of the eye. Is the patient up to date with their annual eye exam?  Yes  Who is the provider or what is the name of the office in which the patient attends annual eye exams? [facility] If pt is not established with a provider, would they like to be referred to a provider to establish care? No .   Dental Screening: Recommended annual dental exams for proper oral hygiene    Community Resource Referral / Chronic Care Management: CRR required this visit?  No   CCM required this visit?  No     Plan:     I have personally reviewed and noted the following in the patient's chart:   Medical and social history Use of alcohol, tobacco or illicit drugs  Current medications and supplements including opioid prescriptions. Patient is not currently taking opioid prescriptions. Functional ability and status Nutritional status Physical activity Advanced directives List of other physicians Hospitalizations, surgeries, and ER visits in previous 12 months Vitals Screenings to include cognitive, depression, and falls Referrals and appointments  In addition, I have reviewed and discussed with patient certain preventive protocols, quality metrics, and best practice recommendations. A written  personalized care plan for preventive services as well as general preventive health recommendations were provided to patient.     Cala Bradford, CMA   11/11/2022   After Visit Summary: (MyChart) Due to this being a telephonic visit, the after visit summary with patients personalized plan was offered to patient via MyChart   Nurse Notes: Face-To-Face Visit  Ms. Harrell Gave , Thank you for taking time to come for your Medicare Wellness Visit. I appreciate your ongoing commitment to your health goals. Please review the following plan we discussed and let me know if I can assist you in the future.   These are the goals we discussed:  Goals   None     This is a list of the screening recommended for you and due dates:  Health Maintenance  Topic Date Due   Screening for Lung Cancer  11/26/2022   Medicare Annual Wellness Visit  11/11/2023   DEXA scan (bone density measurement)  Completed   Hepatitis C Screening  Completed   HPV Vaccine  Aged Out   DTaP/Tdap/Td vaccine  Discontinued   Pneumonia Vaccine  Discontinued   Flu Shot  Discontinued   Colon Cancer Screening  Discontinued   COVID-19 Vaccine  Discontinued   Zoster (Shingles) Vaccine  Discontinued

## 2022-11-11 NOTE — Assessment & Plan Note (Signed)
Doing well with trazadone 100 mg nightly, tolerated well, no uncomfortable side effects.  Continue to monitor.

## 2022-11-11 NOTE — Assessment & Plan Note (Signed)
See "voice complaint"

## 2022-11-11 NOTE — Assessment & Plan Note (Signed)
Doing well with ongoing citalopram 20 mg tolerated well.  Continue to monitor.

## 2022-11-11 NOTE — Assessment & Plan Note (Signed)
Observed today - didn't recall having had a laryngeal exam from ENT last year or a screening lung CT last year. Monitor.

## 2022-11-14 ENCOUNTER — Ambulatory Visit: Payer: Medicare Other | Admitting: Rehabilitative and Restorative Service Providers"

## 2022-11-18 NOTE — Progress Notes (Unsigned)
Office Visit Note  Patient: Kristen Jacobs             Date of Birth: Aug 16, 1945           MRN: [id number]             PCP: Miguel Aschoff, MD Referring: Miguel Aschoff, MD Visit Date: 12/02/2022 Occupation: @GUAROCC @  Subjective:  Medication monitoring  History of Present Illness: Kristen Jacobs is a 77 y.o. female with history of seropositive rheumatoid arthritis and osteoarthritis. She remains on Plaquenil 200 mg 1 tablet by mouth twice daily Monday to Friday.  She is tolerating Plaquenil without any side effects and has not missed any doses recently.  Patient denies any recent rheumatoid arthritis flares.  She has not noticed any increased joint swelling.  Her morning stiffness is only been lasting a few minutes daily.  Patient experiences intermittent discomfort in her lower back and piriformis.  She has been taking muscle laxer and tramadol as needed if she has an exacerbation of symptoms.  She continues to go to Pilates twice a week which she enjoys. She denies any new medical conditions.  Activities of Daily Living:  Patient reports morning stiffness for a few minutes.   Patient Denies nocturnal pain.  Difficulty dressing/grooming: Denies Difficulty climbing stairs: Denies Difficulty getting out of chair: Denies Difficulty using hands for taps, buttons, cutlery, and/or writing: Reports  Review of Systems  Constitutional:  Positive for fatigue.  HENT:  Negative for mouth sores and mouth dryness.   Eyes:  Negative for dryness.  Respiratory:  Negative for shortness of breath.   Cardiovascular:  Negative for chest pain and palpitations.  Gastrointestinal:  Negative for blood in stool, constipation and diarrhea.  Endocrine: Negative for increased urination.  Genitourinary:  Negative for involuntary urination.  Musculoskeletal:  Positive for joint pain, joint pain, myalgias, muscle weakness, morning stiffness, muscle tenderness and myalgias. Negative for gait problem  and joint swelling.  Skin:  Negative for color change, rash, hair loss and sensitivity to sunlight.  Allergic/Immunologic: Negative for susceptible to infections.  Neurological:  Negative for dizziness and headaches.  Hematological:  Negative for swollen glands.  Psychiatric/Behavioral:  Negative for depressed mood and sleep disturbance. The patient is not nervous/anxious.     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   Insomnia disorder 10/31/2021   Fear of vaccinations 02/22/2021   History of malignant neoplasm of skin 11/22/2020   Age-related osteoporosis without fracture 11/22/2020   Major depression in remission (HCC) 11/22/2020   History of hepatitis C 04/05/2020   Cervical spondylosis with radiculopathy 02/21/2020   Rheumatoid factor positive 02/17/2020   Laryngopharyngeal reflux (LPR) 12/20/2019   Primary osteoarthritis of left knee 07/19/2019   Subacromial bursitis of left shoulder joint 07/19/2019   Bilateral wrist pain, 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 05/24/2019   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  Urine POC dipstick normal today.    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  Moved to [facility] in late 2010's.  Enjoys healthful living, Mediterranean diet, staying active.  First husband passed away due to complications from diabetes.  She has since remarried.   Immunization History  Administered Date(s) Administered   [employer] Vaccination 11/17/2019, 12/05/2019, 01/02/2020     Objective: Vital Signs: BP (!) 92/58 (BP Location: Left Arm, Patient Position: Sitting, Cuff Size: Normal)   Pulse 60   Resp 15   Ht 5\' 6"  (1.676 m)   Wt 140 lb (63.5 kg)   BMI 22.60 kg/m    Physical Exam Vitals and nursing note reviewed.  Constitutional:      Appearance: She is well-developed.  HENT:     Head: Normocephalic and atraumatic.  Eyes:     Conjunctiva/sclera: Conjunctivae normal.  Cardiovascular:     Rate and Rhythm: Normal rate and regular rhythm.     Heart sounds: Normal heart sounds.  Pulmonary:     Effort: Pulmonary effort is normal.     Breath sounds: Normal breath sounds.  Abdominal:     General: Bowel sounds are normal.     Palpations: Abdomen is soft.  Musculoskeletal:     Cervical back: Normal range of motion.  Lymphadenopathy:     Cervical: No cervical adenopathy.  Skin:    General: Skin is warm and dry.     Capillary Refill: Capillary refill takes less than 2 seconds.  Neurological:     Mental Status: She is alert and oriented to person, place, and time.  Psychiatric:        Behavior: Behavior normal.      Musculoskeletal Exam: C-spine, thoracic spine, and lumbar spine good ROM.  Shoulder joints, elbow joints, wrist joints, MCPs, PIPs,  and DIPs good ROM with no synovitis.  Complete fist formation bilaterally.  PIP and DIP prominence consistent with osteoarthritis of both hands.  CMC joint prominence noted.  Hip joints have good range of motion with no groin pain.  Knee joints have good range of motion with no warmth or effusion.  Ankle joints have good range of motion with no tenderness or joint swelling.  Tailor bunions noted bilaterally.  Thickening over the first MTP joint bilaterally.  PIP and DIP thickening consistent with osteoarthritis of both feet.  CDAI Exam: CDAI Score: -- Patient Global: 10 / 100; Provider Global: 10 / 100 Swollen: --; Tender: -- Joint Exam 12/02/2022   No joint exam has been documented for this visit   There is currently no information documented on the homunculus. Go to the Rheumatology activity and complete the homunculus joint exam.  Investigation: No additional  findings.  Imaging: No results found.  Recent Labs: Lab Results  Component Value Date   WBC 6.1 07/01/2022   HGB 14.2 07/01/2022   PLT 250 07/01/2022   NA 137 07/01/2022   K 4.9 07/01/2022   CL 103 07/01/2022   CO2 30 07/01/2022   GLUCOSE 79 07/01/2022   BUN 15 07/01/2022   CREATININE 0.86 07/01/2022   BILITOT 0.4 07/01/2022   ALKPHOS 87 01/24/2021   AST 19 07/01/2022   ALT 12 07/01/2022   PROT 6.0 (L) 07/01/2022   ALBUMIN 4.2 01/24/2021   CALCIUM 9.4 07/01/2022   GFRAA 67 10/25/2020    Speciality Comments: PLQ eye exam: 09/09/2022 [facility] f/u 12 months  Procedures:  No procedures performed Allergies: Other and Sulfa antibiotics   Assessment / Plan:     Visit Diagnoses: Rheumatoid arthritis involving multiple sites with positive rheumatoid factor (HCC) - MRI of the wrist joint showed synovitis, tenosynovitis and possible erosions: She has no joint tenderness or synovitis on examination today.  She has clinically been doing well taking Plaquenil 200 mg 1 tablet by mouth twice daily Monday  through Friday.  She is tolerating Plaquenil without any side effects and has not missed any doses recently.  Her morning stiffness is only been lasting a few minutes daily.  She has not noticed any joint inflammation recently.  She remains active going to Pilates twice weekly.  She will remain on Plaquenil as monotherapy.  She was vies notify us if she develops signs or symptoms of a flare.  She will follow-up in the office in 5 months or sooner if needed.  High risk medication use - Plaquenil 200 mg 1 tablet by mouth twice daily Monday to Friday start date March 08, 2020.  CBC and CMP updated on 07/01/22. Orders for CBC and CMP released today.   PLQ eye exam: 09/09/2022 [facility] f/u 12 months  - Plan: COMPLETE METABOLIC PANEL WITH GFR, CBC with Differential/Platelet  Pain in both hands: No joint tenderness or synovitis noted on examination today.  Complete fist formation bilaterally.  Primary osteoarthritis of left knee: Good range of motion of the left knee joint with no warmth or effusion.  Primary osteoarthritis of both feet: PIP and DIP thickening consistent with osteoarthritis of both feet.  Prominence at the base of the fifth metatarsal.  Tailor bunions noted bilaterally.  Thickening over bilateral first MTP joints.  No synovitis noted.  DDD (degenerative disc disease), cervical - Followed by Dr. Lovell Sheehan.  Closed nondisplaced avulsion fracture of left talus with routine healing, subsequent encounter  Age-related osteoporosis without fracture - patient states that her T-score was -2.8.  I do not have DEXA results available.  Other medical conditions are listed as follows:  History of hepatitis C  Other fatigue  Primary insomnia - She takes trazodone 100 mg at bedtime for insomnia.  Anxiety  Orders: Orders Placed This Encounter  Procedures   COMPLETE METABOLIC PANEL WITH GFR   CBC with Differential/Platelet   No orders of the defined types were placed in this  encounter.    Follow-Up Instructions: Return in about 5 months (around 05/04/2023) for Rheumatoid arthritis.   Gearldine Bienenstock, PA-C  Note - This record has been created using Dragon software.  Chart creation errors have been sought, but may not always  have been located. Such creation errors do not reflect on  the standard of medical care.

## 2022-11-21 ENCOUNTER — Ambulatory Visit: Payer: Medicare Other | Admitting: Rehabilitative and Restorative Service Providers"

## 2022-11-28 ENCOUNTER — Ambulatory Visit: Payer: Medicare Other | Attending: Sports Medicine | Admitting: Physical Therapy

## 2022-11-28 DIAGNOSIS — M5459 Other low back pain: Secondary | ICD-10-CM | POA: Diagnosis present

## 2022-11-28 DIAGNOSIS — M542 Cervicalgia: Secondary | ICD-10-CM | POA: Diagnosis present

## 2022-11-28 DIAGNOSIS — M6281 Muscle weakness (generalized): Secondary | ICD-10-CM | POA: Insufficient documentation

## 2022-11-28 NOTE — Therapy (Signed)
OUTPATIENT PHYSICAL THERAPY CERVICAL TREATMENT AND RECERTIFICATION AND 10th VISIT NOTE  Patient Name: Neddie Hergert MRN: [id number] DOB:1945-12-17, 77 y.o., female Today's Date: 11/28/2022 DATES OF SERVICE: 09/05/22-11/28/22 END OF SESSION:  PT End of Session - 11/28/22 1018     Visit Number 9    Number of Visits 16    Date for PT Re-Evaluation 01/09/23    Authorization Type [employer] medicare    Authorization - Visit Number 10    Progress Note Due on Visit 20    PT Start Time 1018    PT Stop Time 1058    PT Time Calculation (min) 40 min    Activity Tolerance Patient tolerated treatment well    Behavior During Therapy WFL for tasks assessed/performed             Past Medical History:  Diagnosis Date   Avulsion fracture of left talus 09/05/2019   Constipation    Hepatitis    History of UTI 11/22/2020   Recent event, fatigue w/o dysuria, symptoms resolved with antibiotic (name not recalled).  Urine POC dipstick normal today.    Joint pain    Past Surgical History:  Procedure Laterality Date   BREAST BIOPSY Bilateral    benign   CATARACT EXTRACTION Right 03/2020   LAPAROSCOPY     LIVER BIOPSY     OOPHORECTOMY     right    TONSILLECTOMY     age 66    Patient Active Problem List   Diagnosis Date Noted   Voice complaint 11/11/2022   History of smoking 11/11/2022   Forgetfulness 11/11/2022   Bilateral foot pain 11/11/2022   Lumbar spondylosis 08/29/2022   COVID-19 virus infection 01/30/2022   Thyroid nodules; small, benign appearance by [place] 2023 12/10/2021   Vasomotor rhinitis 12/10/2021   Insomnia disorder 10/31/2021   Fear of vaccinations 02/22/2021   History of malignant neoplasm of skin 11/22/2020   Age-related osteoporosis without fracture 11/22/2020   Major depression in remission (HCC) 11/22/2020   History of hepatitis C 04/05/2020   Cervical spondylosis with radiculopathy 02/21/2020   Rheumatoid factor positive 02/17/2020   Laryngopharyngeal reflux (LPR)  12/20/2019   Primary osteoarthritis of left knee 07/19/2019   Subacromial bursitis of left shoulder joint 07/19/2019   Bilateral wrist pain, 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 05/24/2019   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   Chronic right shoulder pain 11/12/2017   Pain of right sacroiliac joint 09/10/2017   Chronic right hip pain 08/11/2017   PCP: Miguel Aschoff, MD REFERRING PROVIDER: Rodney Langton, MD REFERRING DIAG: [phone] (ICD-10-CM) - Cervical spondylosis with radiculopathy  THERAPY DIAG:  Cervicalgia - Plan: PT plan of care cert/re-cert  Muscle weakness (generalized) - Plan: PT plan of care cert/re-cert  Other low back pain - Plan: PT plan of care cert/re-cert  Rationale for Evaluation and Treatment: Rehabilitation ONSET DATE: 08/29/22 (referral date due to chronic issue)  SUBJECTIVE:  SUBJECTIVE STATEMENT: The patient reports her back and hip are feeling better. Her main c/o pain is in her neck. She states her neck is giving her the most problems. She states that her neck feels OK at rest but hurts with movement. Massage decreases pain R hand dominant. PERTINENT HISTORY:  Rheumatoid arthritis PAIN:  Are you having pain? Yes: NPRS scale: 6 in neck and 2-3 in hips/10 Pain location: neck and R hip Pain description: achiness, pain worse with prolonged position Aggravating factors: walking Relieving factors: no pain sitting PRECAUTIONS: None WEIGHT BEARING RESTRICTIONS: No FALLS:  Has patient fallen in last 6 months? No LIVING ENVIRONMENT: Lives with: lives with their spouse Lives in: House/apartment PLOF: Independent PATIENT GOALS: reduce pain  OBJECTIVE:  (Measures in this section from initial evaluation unless otherwise noted) PATIENT SURVEYS:  FOTO=65% (goal  is 68%) SENSATION: WFL POSTURE: No Significant postural limitations PALPATION: Tender along R lateral border of sacrum, R QL, R glut med, R piriformis CERVICAL ROM:  WFLs, although does note pain after laying prone from neck positioning. LOWER EXTREMITY STRENGTH: 5/5 B hip flexion, B knee extension, B knee flexion, and ankle DF. LUMBAR ROM:  WNLs ROM, pain with R lateral trunk lean on R side, tightness on R noted with L sidebending. 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  [facility] Adult PT Treatment:                                                DATE: 11/28/22 Therapeutic Exercise: Supine head on coregous ball: nods x 15, cervical retraction x 10, cervical rotation AROM , bear hug x 10, alt shoulder flexion x10 Prone: T x 10, T with ER x 10, Y x 10 Manual Therapy: Suboccipital release supine Manual cervical traction with 8 second holds intermittent x 5 minutes End range neck flexion  Therapeutic Activity: FOTO 59 Review HEP   [facility] Adult PT Treatment:                                                DATE: 10/29/22 Therapeutic Exercise: Sidelying Spinal twist open book Foam roller pelvic mobility Hip hiking and depression x 12 reps Standing QL and lateral IT stretch--painful on the right Supine Head nod x 10 reps with head supported on coregous ball Chin tuck on coregous ball AROM cervical rotation  Quadriped Lateral child's pose for trunk opening Manual Therapy: Suboccipital release supine Manual cervical traction with 8 second holds intermittent x 5 minutes Passive end range cervical flexion with rotation *initially limited with R rotation due to suboccipital spasm and then improved with manual Coregous ball passive traction x 4 minutes with instruction for home Self Care: Discussed self management of neck and low back tightness, using ball to release  neck tightness  [facility] Adult PT Treatment:                                                DATE: 10/15/22 Therapeutic Exercise: Supine Piriformis stretching  supine R and L sides IT Band stretch R and L Lumbar rocking Standing 1# overhead raises W x 12 reps with 1# weight L x 12 reps with 1# weights Prone (BLACKBURN scapular stabilizers) Ts x 12 reps Ts with ER x 12 reps Ys x 12 reps Ys with ER x 12 reps Goal post with thumbs up lift x 12 reps Shoulder extension x 12 reps   [facility] Adult PT Treatment:                                                DATE: 10/06/22 Therapeutic Exercise: Supine Lumbar rocking x 10 reps Thoracic extension over towel roll Snow angels with towel roll x 10 reps Double knee to chest with rocking Sidelying Thoracic opening x 5 reps Prone Child's pose with lateral hand placement-- painful on L posterior arm Manual Therapy: STM lumbar spine and glut med, piriformis, STM R and L infraspinatus, upper trap, erector spinae musculature Trigger Point Dry-Needling  Treatment instructions: Expect mild to moderate muscle soreness. . Patient verbalized understanding of these instructions and education. Patient Consent Given: Yes Education handout provided: Previously provided Muscles treated: bilat upper trap and infraspinatus, R lumbar multifidi, and R pifiromis Treatment response/outcome: palpable lengthening and twitch response noted (in bilat upper trap and infraspinatus) Modalities: Stayed x 15 minutes ( no charge) after session for moist heat   PATIENT EDUCATION:  Education details: HEP Person educated: Patient Education method: Programmer, multimedia, Facilities manager, and Handouts Education comprehension: verbalized understanding and returned demonstration  HOME EXERCISE PROGRAM: Access Code: [email or web address] URL: [email or web address] Date: 11/28/2022 Prepared by: Reggy Eye  Exercises - Sidelying Open Book Thoracic Lumbar Rotation and Extension  - 2 x  daily - 7 x weekly - 1 sets - 5 reps - Pigeon Pose  - 2 x daily - 7 x weekly - 1 sets - 10 reps - Standing with Forearms Thoracic Rotation  - 1 x daily - 7 x weekly - 1 sets - 10 reps - Standing Quadratus Lumborum Stretch with Doorway  - 1 x daily - 7 x weekly - 1 sets - 3 reps - 30 seconds hold - Standing Glute Med Mobilization with Small Ball on Wall  - 1 x daily - 7 x weekly - 1 sets - 10 reps - Cervical Retraction at Wall  - 1 x daily - 7 x weekly - 1 sets - 10 reps - Single Leg Balance with Clock Reach  - 2 x daily - 7 x weekly - 1 sets - 10 reps - Split Squats Upright Trunk with Dumbbells (Quad Bias)  - 2 x daily - 7 x weekly - 1 sets - 10 reps - Shoulder External Rotation and Scapular Retraction with Resistance  - 2 x daily - 7 x weekly - 2 sets - 10 reps - Supine Head Nod with Deep Neck Flexor Activation  - 1 x daily - 7 x weekly - 3 sets - 10 reps - Supine Cervical Retraction with Towel  - 1 x daily - 7 x weekly - 3 sets - 10 reps - Prone Scapular Slide with Shoulder Extension  - 1 x daily - 7 x weekly - 3 sets - 10 reps - Prone Scapular Retraction Arms at Side  - 1 x daily - 7 x weekly - 3 sets - 10 reps - Prone W Scapular Retraction  -  1 x daily - 7 x weekly - 3 sets - 10 reps - Prone Scapular Retraction Y  - 1 x daily - 7 x weekly - 3 sets - 10 reps  ASSESSMENT:  CLINICAL IMPRESSION: Pt reports feeling much less pain at end of session. She has improved mobility and decreased pain. She is progressing well towards goals and will continue to benefit from skilled PT to address ongoing deficits  OBJECTIVE IMPAIRMENTS: increased fascial restrictions, impaired flexibility, and pain.   GOALS: Goals reviewed with patient? Yes  SHORT TERM GOALS: Target date: 10/03/22  The patient will be indep with HEP Baseline: initiated at eval Goal status:MET  2.  The patient will report pain at its worst at 6/10. Baseline: pain is still high (feeling a sharp knife like jab in R hip followed by  pain) Goal status: MET  3.  The patient will return to pilates class. Baseline: stopped x 2 weeks Goal status: MET-- she returned to pilates yesterday and is tight today  LONG TERM GOALS: Target date: 01/09/2023    The patient will be indep with HEP progression. Baseline:  initiated at eval Goal status: IN PROGRESS  2.  The patient will report no resting pain. Baseline: 3-4/10 Goal status: IN PROGRESS  3.  The patient will improve FOTO up to 68% Baseline: 63% Goal status: IN PROGRESS  4.  The patient will be able to walk for 20 minutes nonstop without increase in pain. Baseline:  pain with prolonged walking Goal status:IN PROGRESS   5.  The patient will be able to lift 10 lbs overhead to demo improved UE strength. Baseline:  Notes feeling weaker with overhead tasks. Goal status: MET  PLAN:  PT FREQUENCY: 1x/week  PT DURATION: 8 weeks  PLANNED INTERVENTIONS: Therapeutic exercises, Therapeutic activity, Neuromuscular re-education, Balance training, Gait training, Patient/Family education, Self Care, Joint mobilization, Dry Needling, Electrical stimulation, Spinal mobilization, Cryotherapy, Moist heat, Taping, and Manual therapy  PLAN FOR NEXT SESSION: overhead strengthening, postural strengthening, Core activation. Discuss cervical region and consider DN + stretching L upper trap    Osias Resnick, PT 11/28/2022, 10:58 AM

## 2022-12-02 ENCOUNTER — Ambulatory Visit: Payer: Medicare Other | Attending: Physician Assistant | Admitting: Physician Assistant

## 2022-12-02 ENCOUNTER — Encounter: Payer: Self-pay | Admitting: Physician Assistant

## 2022-12-02 VITALS — BP 92/58 | HR 60 | Resp 15 | Ht 66.0 in | Wt 140.0 lb

## 2022-12-02 DIAGNOSIS — M19072 Primary osteoarthritis, left ankle and foot: Secondary | ICD-10-CM

## 2022-12-02 DIAGNOSIS — M818 Other osteoporosis without current pathological fracture: Secondary | ICD-10-CM

## 2022-12-02 DIAGNOSIS — M79641 Pain in right hand: Secondary | ICD-10-CM

## 2022-12-02 DIAGNOSIS — R5383 Other fatigue: Secondary | ICD-10-CM

## 2022-12-02 DIAGNOSIS — M79642 Pain in left hand: Secondary | ICD-10-CM

## 2022-12-02 DIAGNOSIS — F419 Anxiety disorder, unspecified: Secondary | ICD-10-CM

## 2022-12-02 DIAGNOSIS — Z79899 Other long term (current) drug therapy: Secondary | ICD-10-CM | POA: Diagnosis not present

## 2022-12-02 DIAGNOSIS — M5030 Other cervical disc degeneration, unspecified cervical region: Secondary | ICD-10-CM

## 2022-12-02 DIAGNOSIS — M1712 Unilateral primary osteoarthritis, left knee: Secondary | ICD-10-CM | POA: Diagnosis not present

## 2022-12-02 DIAGNOSIS — M19071 Primary osteoarthritis, right ankle and foot: Secondary | ICD-10-CM

## 2022-12-02 DIAGNOSIS — Z8619 Personal history of other infectious and parasitic diseases: Secondary | ICD-10-CM

## 2022-12-02 DIAGNOSIS — S92155D Nondisplaced avulsion fracture (chip fracture) of left talus, subsequent encounter for fracture with routine healing: Secondary | ICD-10-CM

## 2022-12-02 DIAGNOSIS — M0579 Rheumatoid arthritis with rheumatoid factor of multiple sites without organ or systems involvement: Secondary | ICD-10-CM

## 2022-12-02 DIAGNOSIS — F5101 Primary insomnia: Secondary | ICD-10-CM

## 2022-12-02 LAB — CBC WITH DIFFERENTIAL/PLATELET
Absolute Monocytes: 510 cells/uL (ref 200–950)
Basophils Absolute: 50 cells/uL (ref 0–200)
Basophils Relative: 0.8 %
Eosinophils Absolute: 221 cells/uL (ref 15–500)
Eosinophils Relative: 3.5 %
HCT: 39.5 % (ref 35.0–45.0)
Hemoglobin: 12.9 g/dL (ref 11.7–15.5)
Lymphs Abs: 1235 cells/uL (ref 850–3900)
MCH: 29.8 pg (ref 27.0–33.0)
MCHC: 32.7 g/dL (ref 32.0–36.0)
MCV: 91.2 fL (ref 80.0–100.0)
MPV: 11.5 fL (ref 7.5–12.5)
Monocytes Relative: 8.1 %
Neutro Abs: 4284 cells/uL (ref 1500–7800)
Neutrophils Relative %: 68 %
Platelets: 233 10*3/uL (ref 140–400)
RBC: 4.33 10*6/uL (ref 3.80–5.10)
RDW: 11.7 % (ref 11.0–15.0)
Total Lymphocyte: 19.6 %
WBC: 6.3 10*3/uL (ref 3.8–10.8)

## 2022-12-03 ENCOUNTER — Ambulatory Visit: Payer: Self-pay | Admitting: Podiatry

## 2022-12-03 ENCOUNTER — Other Ambulatory Visit: Payer: Self-pay | Admitting: Internal Medicine

## 2022-12-03 ENCOUNTER — Telehealth: Payer: Self-pay | Admitting: *Deleted

## 2022-12-03 DIAGNOSIS — Z79899 Other long term (current) drug therapy: Secondary | ICD-10-CM

## 2022-12-03 DIAGNOSIS — M818 Other osteoporosis without current pathological fracture: Secondary | ICD-10-CM

## 2022-12-03 NOTE — Progress Notes (Signed)
CBC WNL.  Creatinine is borderline elevated-1.03 and GFR is slightly low-56.  Please clarify if she has been taking any NSAIDs? Avoid the use of NSAIDs and increase water intake.  Recommend rechecking BMP with GFR in 2-3 weeks. Total protein remains low, albumin is low.  Please forward results to PCP.

## 2022-12-03 NOTE — Telephone Encounter (Signed)
-----   Message from Gearldine Bienenstock sent at 12/03/2022  7:00 AM EDT ----- CBC WNL.  Creatinine is borderline elevated-1.03 and GFR is slightly low-56.  Please clarify if she has been taking any NSAIDs? Avoid the use of NSAIDs and increase water intake.  Recommend rechecking BMP with GFR in 2-3 weeks. Total protein remains low, albumin is low.  Please forward results to PCP.

## 2022-12-04 ENCOUNTER — Ambulatory Visit: Payer: Medicare Other | Admitting: Podiatry

## 2022-12-04 ENCOUNTER — Ambulatory Visit (INDEPENDENT_AMBULATORY_CARE_PROVIDER_SITE_OTHER): Payer: Medicare Other

## 2022-12-04 ENCOUNTER — Encounter: Payer: Self-pay | Admitting: Podiatry

## 2022-12-04 DIAGNOSIS — M7671 Peroneal tendinitis, right leg: Secondary | ICD-10-CM | POA: Diagnosis not present

## 2022-12-04 DIAGNOSIS — M778 Other enthesopathies, not elsewhere classified: Secondary | ICD-10-CM

## 2022-12-04 MED ORDER — TRIAMCINOLONE ACETONIDE 10 MG/ML IJ SUSP
10.0000 mg | Freq: Once | INTRAMUSCULAR | Status: AC
Start: 2022-12-04 — End: 2022-12-04
  Administered 2022-12-04: 10 mg via INTRA_ARTICULAR

## 2022-12-05 NOTE — Progress Notes (Signed)
Subjective:   Patient ID: Kristen Jacobs, female   DOB: 77 y.o.   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  Careful sterile prep careful injection 3 mg Dexasone Kenalog 5 mg Xylocaine sheath of peroneal tendon base of fifth metatarsal advised on reduced activity may require treatment of fifth metatarsal head if it remains a problem  X-rays indicate mild prominence around the fifth metatarsal head right and no indications of fracture or other pathology base of fifth metatarsal

## 2022-12-08 ENCOUNTER — Telehealth: Payer: Self-pay | Admitting: *Deleted

## 2022-12-08 NOTE — Telephone Encounter (Signed)
Lvm for patient reminder call for appointment 06/05/2023 @9 :30 a with the [facility] center and that a reminder letter has been mailed. Patient will need to call to cancel for reschedule if unable to keep this appointment if not there will be a $75 "No Show" fee charged.

## 2022-12-12 ENCOUNTER — Ambulatory Visit: Payer: Medicare Other | Admitting: Physical Therapy

## 2022-12-15 ENCOUNTER — Encounter: Payer: Self-pay | Admitting: Rehabilitative and Restorative Service Providers"

## 2022-12-15 ENCOUNTER — Telehealth: Payer: Self-pay | Admitting: *Deleted

## 2022-12-15 ENCOUNTER — Ambulatory Visit: Payer: Medicare Other | Attending: Sports Medicine | Admitting: Rehabilitative and Restorative Service Providers"

## 2022-12-15 DIAGNOSIS — M542 Cervicalgia: Secondary | ICD-10-CM | POA: Insufficient documentation

## 2022-12-15 DIAGNOSIS — M5459 Other low back pain: Secondary | ICD-10-CM | POA: Insufficient documentation

## 2022-12-15 DIAGNOSIS — M6281 Muscle weakness (generalized): Secondary | ICD-10-CM | POA: Diagnosis present

## 2022-12-15 NOTE — Telephone Encounter (Signed)
Patient contacted about appointment for CT -states she is not aware and will call when she get the reminder letter. Patient was instructed to call to reschedule/ cancel the appointment if not there will be $78.00 not show fee.

## 2022-12-15 NOTE — Therapy (Unsigned)
OUTPATIENT PHYSICAL THERAPY CERVICAL TREATMENT AND RECERTIFICATION AND 10th VISIT NOTE  Patient Name: Kristen Jacobs MRN: [id number] DOB:[birth year], 77 y.o., female Today's Date: 12/15/2022 DATES OF SERVICE: 09/05/22-11/28/22 END OF SESSION:  PT End of Session - 12/15/22 1406     Visit Number 10    Number of Visits 16    Date for PT Re-Evaluation 01/09/23    Authorization Type [employer] medicare    Progress Note Due on Visit 20    PT Start Time 1405    PT Stop Time 1445    PT Time Calculation (min) 40 min    Activity Tolerance Patient tolerated treatment well    Behavior During Therapy WFL for tasks assessed/performed             Past Medical History:  Diagnosis Date   Avulsion fracture of left talus 09/05/2019   Constipation    Hepatitis    History of UTI 11/22/2020   Recent event, fatigue w/o dysuria, symptoms resolved with antibiotic (name not recalled).  Urine POC dipstick normal today.    Joint pain    Past Surgical History:  Procedure Laterality Date   BREAST BIOPSY Bilateral    benign   CATARACT EXTRACTION Right 03/2020   LAPAROSCOPY     LIVER BIOPSY     OOPHORECTOMY     right    TONSILLECTOMY     age 57    Patient Active Problem List   Diagnosis Date Noted   Voice complaint 11/11/2022   History of smoking 11/11/2022   Forgetfulness 11/11/2022   Bilateral foot pain 11/11/2022   Lumbar spondylosis 08/29/2022   COVID-19 virus infection 01/30/2022   Thyroid nodules; small, benign appearance by [place] 2023 12/10/2021   Vasomotor rhinitis 12/10/2021   Insomnia disorder 10/31/2021   Fear of vaccinations 02/22/2021   History of malignant neoplasm of skin 11/22/2020   Age-related osteoporosis without fracture 11/22/2020   Major depression in remission (HCC) 11/22/2020   History of hepatitis C 04/05/2020   Cervical spondylosis with radiculopathy 02/21/2020   Rheumatoid factor positive 02/17/2020   Laryngopharyngeal reflux (LPR) 12/20/2019   Primary osteoarthritis of  left knee 07/19/2019   Subacromial bursitis of left shoulder joint 07/19/2019   Bilateral wrist pain, 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 05/24/2019   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   Chronic right shoulder pain 11/12/2017   Pain of right sacroiliac joint 09/10/2017   Chronic right hip pain 08/11/2017   PCP: Miguel Aschoff, MD REFERRING PROVIDER: Rodney Langton, MD REFERRING DIAG: [phone] (ICD-10-CM) - Cervical spondylosis with radiculopathy  THERAPY DIAG:  Cervicalgia  Muscle weakness (generalized)  Other low back pain  Rationale for Evaluation and Treatment: Rehabilitation ONSET DATE: 08/29/22 (referral date due to chronic issue)  SUBJECTIVE:  SUBJECTIVE STATEMENT: The patient reports she just got back from a trip to help care for a friend. She knows what to do for her back, but feels therapy to focus on neck is helpful.  Two days ago, she notes more sharp neck pain. She felt it in suboccipitals and into upper trap on L side. She is using a ball against the wall to massage her neck.  PERTINENT HISTORY:  Rheumatoid arthritis PAIN:  Are you having pain? Yes: NPRS scale: 6 in neck /10 Pain location: neck and R hip Pain description: achiness, pain worse with prolonged position Aggravating factors: walking Relieving factors: no pain sitting PRECAUTIONS: None WEIGHT BEARING RESTRICTIONS: No FALLS:  Has patient fallen in last 6 months? No LIVING ENVIRONMENT: Lives with: lives with their spouse Lives in: House/apartment PLOF: Independent PATIENT GOALS: reduce pain  OBJECTIVE:  (Measures in this section from initial evaluation unless otherwise noted) PATIENT SURVEYS:  FOTO=65% (goal is 68%) SENSATION: WFL POSTURE: No Significant postural limitations PALPATION: Tender  along R lateral border of sacrum, R QL, R glut med, R piriformis CERVICAL ROM:  WFLs, although does note pain after laying prone from neck positioning. LOWER EXTREMITY STRENGTH: 5/5 B hip flexion, B knee extension, B knee flexion, and ankle DF. LUMBAR ROM:  WNLs ROM, pain with R lateral trunk lean on R side, tightness on R noted with L sidebending. 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  [facility] Adult PT Treatment:                                                DATE: 12/15/22 Therapeutic Exercise: Supine Coregous ball with head nods, A/ROM rotation, chin tucks, alternating shoulder flexion, W x 10 reps supine  Prone W x 10 reps T x 10 T with ER x 10 Y x 10 I x 10 Manual Therapy: STM suboccipital release supine Manual cervical traction End range cervical flexion Sidelying scapular mobilization  Modalities: *** Self Care: ***   [facility] Adult PT Treatment:                                                DATE: 11/28/22 Therapeutic Exercise: Supine head on coregous ball: nods x 15, cervical retraction x 10, cervical rotation AROM , bear hug x 10, alt shoulder flexion x10 Prone: T x 10, T with ER x 10, Y x 10 Manual Therapy: Suboccipital release supine Manual cervical traction with 8 second holds intermittent x 5 minutes End range neck flexion Therapeutic Activity: FOTO 59 Review HEP   [facility] Adult PT Treatment:                                                DATE: 10/29/22 Therapeutic Exercise: Sidelying Spinal twist open book Foam roller pelvic mobility Hip hiking and depression x 12 reps Standing QL and lateral IT stretch--painful on the right Supine Head nod x 10 reps with head supported on coregous ball Chin tuck on coregous ball AROM cervical  rotation  Quadriped Lateral child's pose for trunk opening Manual Therapy: Suboccipital release  supine Manual cervical traction with 8 second holds intermittent x 5 minutes Passive end range cervical flexion with rotation *initially limited with R rotation due to suboccipital spasm and then improved with manual Coregous ball passive traction x 4 minutes with instruction for home Self Care: Discussed self management of neck and low back tightness, using ball to release neck tightness  [facility] Adult PT Treatment:                                                DATE: 10/15/22 Therapeutic Exercise: Supine Piriformis stretching supine R and L sides IT Band stretch R and L Lumbar rocking Standing 1# overhead raises W x 12 reps with 1# weight L x 12 reps with 1# weights Prone (BLACKBURN scapular stabilizers) Ts x 12 reps Ts with ER x 12 reps Ys x 12 reps Ys with ER x 12 reps Goal post with thumbs up lift x 12 reps Shoulder extension x 12 reps  [facility] Adult PT Treatment:                                                DATE: 10/06/22 Therapeutic Exercise: Supine Lumbar rocking x 10 reps Thoracic extension over towel roll Snow angels with towel roll x 10 reps Double knee to chest with rocking Sidelying Thoracic opening x 5 reps Prone Child's pose with lateral hand placement-- painful on L posterior arm Manual Therapy: STM lumbar spine and glut med, piriformis, STM R and L infraspinatus, upper trap, erector spinae musculature Trigger Point Dry-Needling  Treatment instructions: Expect mild to moderate muscle soreness. . Patient verbalized understanding of these instructions and education. Patient Consent Given: Yes Education handout provided: Previously provided Muscles treated: bilat upper trap and infraspinatus, R lumbar multifidi, and R pifiromis Treatment response/outcome: palpable lengthening and twitch response noted (in bilat upper trap and infraspinatus) Modalities: Stayed x 15 minutes ( no charge) after session for moist heat   PATIENT EDUCATION:  Education details:  HEP Person educated: Patient Education method: Programmer, multimedia, Facilities manager, and Handouts Education comprehension: verbalized understanding and returned demonstration  HOME EXERCISE PROGRAM: Access Code: [email or web address] URL: [email or web address] Date: 11/28/2022 Prepared by: Reggy Eye  Exercises - Sidelying Open Book Thoracic Lumbar Rotation and Extension  - 2 x daily - 7 x weekly - 1 sets - 5 reps - Pigeon Pose  - 2 x daily - 7 x weekly - 1 sets - 10 reps - Standing with Forearms Thoracic Rotation  - 1 x daily - 7 x weekly - 1 sets - 10 reps - Standing Quadratus Lumborum Stretch with Doorway  - 1 x daily - 7 x weekly - 1 sets - 3 reps - 30 seconds hold - Standing Glute Med Mobilization with Small Ball on Wall  - 1 x daily - 7 x weekly - 1 sets - 10 reps - Cervical Retraction at Wall  - 1 x daily - 7 x weekly - 1 sets - 10 reps - Single Leg Balance with Clock Reach  - 2 x daily - 7 x weekly - 1 sets - 10 reps - Split Squats Upright Trunk with Dumbbells (Quad Bias)  -  2 x daily - 7 x weekly - 1 sets - 10 reps - Shoulder External Rotation and Scapular Retraction with Resistance  - 2 x daily - 7 x weekly - 2 sets - 10 reps - Supine Head Nod with Deep Neck Flexor Activation  - 1 x daily - 7 x weekly - 3 sets - 10 reps - Supine Cervical Retraction with Towel  - 1 x daily - 7 x weekly - 3 sets - 10 reps - Prone Scapular Slide with Shoulder Extension  - 1 x daily - 7 x weekly - 3 sets - 10 reps - Prone Scapular Retraction Arms at Side  - 1 x daily - 7 x weekly - 3 sets - 10 reps - Prone W Scapular Retraction  - 1 x daily - 7 x weekly - 3 sets - 10 reps - Prone Scapular Retraction Y  - 1 x daily - 7 x weekly - 3 sets - 10 reps  ASSESSMENT:  CLINICAL IMPRESSION: Pt reports feeling much less pain at end of session. She has improved mobility and decreased pain. She is progressing well towards goals and will continue to benefit from skilled PT to address ongoing deficits  OBJECTIVE  IMPAIRMENTS: increased fascial restrictions, impaired flexibility, and pain.   GOALS: Goals reviewed with patient? Yes  SHORT TERM GOALS: Target date: 10/03/22  The patient will be indep with HEP Baseline: initiated at eval Goal status:MET  2.  The patient will report pain at its worst at 6/10. Baseline: pain is still high (feeling a sharp knife like jab in R hip followed by pain) Goal status: MET  3.  The patient will return to pilates class. Baseline: stopped x 2 weeks Goal status: MET-- she returned to pilates yesterday and is tight today  LONG TERM GOALS: Target date: 01/09/2023  The patient will be indep with HEP progression. Baseline:  initiated at eval Goal status: IN PROGRESS  2.  The patient will report no resting pain. Baseline: 3-4/10 Goal status: IN PROGRESS  3.  The patient will improve FOTO up to 68% Baseline: 63% Goal status: IN PROGRESS  4.  The patient will be able to walk for 20 minutes nonstop without increase in pain. Baseline:  pain with prolonged walking Goal status: MET-- this can vary  5.  The patient will be able to lift 10 lbs overhead to demo improved UE strength. Baseline:  Notes feeling weaker with overhead tasks. Goal status: MET  PLAN:  PT FREQUENCY: 1x/week  PT DURATION: 8 weeks  PLANNED INTERVENTIONS: Therapeutic exercises, Therapeutic activity, Neuromuscular re-education, Balance training, Gait training, Patient/Family education, Self Care, Joint mobilization, Dry Needling, Electrical stimulation, Spinal mobilization, Cryotherapy, Moist heat, Taping, and Manual therapy  PLAN FOR NEXT SESSION: overhead strengthening, postural strengthening, Core activation. Discuss cervical region and consider DN + stretching L upper trap    Jere Vanburen, PT 12/15/2022, 2:06 PM

## 2022-12-16 ENCOUNTER — Encounter: Payer: Self-pay | Admitting: Rehabilitative and Restorative Service Providers"

## 2022-12-19 ENCOUNTER — Ambulatory Visit: Payer: Medicare Other | Admitting: Rehabilitative and Restorative Service Providers"

## 2022-12-22 ENCOUNTER — Ambulatory Visit: Payer: Medicare Other | Admitting: Rehabilitative and Restorative Service Providers"

## 2022-12-22 ENCOUNTER — Encounter: Payer: Self-pay | Admitting: Rehabilitative and Restorative Service Providers"

## 2022-12-22 DIAGNOSIS — M5459 Other low back pain: Secondary | ICD-10-CM

## 2022-12-22 DIAGNOSIS — M542 Cervicalgia: Secondary | ICD-10-CM

## 2022-12-22 DIAGNOSIS — M6281 Muscle weakness (generalized): Secondary | ICD-10-CM

## 2022-12-22 NOTE — Therapy (Signed)
OUTPATIENT PHYSICAL THERAPY CERVICAL TREATMENT AND RECERTIFICATION   Patient Name: Kristen Jacobs MRN: [id number] DOB:05-26-1945, 77 y.o., female Today's Date: 12/22/2022   END OF SESSION:  PT End of Session - 12/22/22 1321     Visit Number 11    Number of Visits 16    Date for PT Re-Evaluation 01/09/23    Authorization Type [employer] medicare    Progress Note Due on Visit 20    PT Start Time 1321    PT Stop Time 1400    PT Time Calculation (min) 39 min    Activity Tolerance Patient tolerated treatment well    Behavior During Therapy WFL for tasks assessed/performed            Past Medical History:  Diagnosis Date   Avulsion fracture of left talus 09/05/2019   Constipation    Hepatitis    History of UTI 11/22/2020   Recent event, fatigue w/o dysuria, symptoms resolved with antibiotic (name not recalled).  Urine POC dipstick normal today.    Joint pain    Past Surgical History:  Procedure Laterality Date   BREAST BIOPSY Bilateral    benign   CATARACT EXTRACTION Right 03/2020   LAPAROSCOPY     LIVER BIOPSY     OOPHORECTOMY     right    TONSILLECTOMY     age 43    Patient Active Problem List   Diagnosis Date Noted   Voice complaint 11/11/2022   History of smoking 11/11/2022   Forgetfulness 11/11/2022   Bilateral foot pain 11/11/2022   Lumbar spondylosis 08/29/2022   COVID-19 virus infection 01/30/2022   Thyroid nodules; small, benign appearance by [place] 2023 12/10/2021   Vasomotor rhinitis 12/10/2021   Insomnia disorder 10/31/2021   Fear of vaccinations 02/22/2021   History of malignant neoplasm of skin 11/22/2020   Age-related osteoporosis without fracture 11/22/2020   Major depression in remission (HCC) 11/22/2020   History of hepatitis C 04/05/2020   Cervical spondylosis with radiculopathy 02/21/2020   Rheumatoid factor positive 02/17/2020   Laryngopharyngeal reflux (LPR) 12/20/2019   Primary osteoarthritis of left knee 07/19/2019   Subacromial bursitis of left  shoulder joint 07/19/2019   Bilateral wrist pain, 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 05/24/2019   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   Chronic right shoulder pain 11/12/2017   Pain of right sacroiliac joint 09/10/2017   Chronic right hip pain 08/11/2017   PCP: Miguel Aschoff, MD REFERRING PROVIDER: Rodney Langton, MD REFERRING DIAG: [phone] (ICD-10-CM) - Cervical spondylosis with radiculopathy  THERAPY DIAG:  Cervicalgia  Muscle weakness (generalized)  Other low back pain  Rationale for Evaluation and Treatment: Rehabilitation ONSET DATE: 08/29/22 (referral date due to chronic issue)  SUBJECTIVE:  SUBJECTIVE STATEMENT: The patient did some sitting yesterday and felt increased neck pain. She used the ball and felt improvement.  Her neck doesn't settle with pain-- pain stays present. PERTINENT HISTORY:  Rheumatoid arthritis PAIN:  Are you having pain? Yes: NPRS scale: 4 in neck /10 Pain location: neck and R hip Pain description: achiness, pain worse with prolonged position Aggravating factors: walking Relieving factors: no pain sitting PRECAUTIONS: None WEIGHT BEARING RESTRICTIONS: No FALLS:  Has patient fallen in last 6 months? No LIVING ENVIRONMENT: Lives with: lives with their spouse Lives in: House/apartment PLOF: Independent PATIENT GOALS: reduce pain  OBJECTIVE:  (Measures in this section from initial evaluation unless otherwise noted) PATIENT SURVEYS:  FOTO=65% (goal is 68%) SENSATION: WFL POSTURE: No Significant postural limitations PALPATION: Tender along R lateral border of sacrum, R QL, R glut med, R piriformis CERVICAL ROM:  WFLs, although does note pain after laying prone from neck positioning. LOWER EXTREMITY STRENGTH: 5/5 B hip flexion, B knee  extension, B knee flexion, and ankle DF. LUMBAR ROM:  WNLs ROM, pain with R lateral trunk lean on R side, tightness on R noted with L sidebending. 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  [facility] Adult PT Treatment:                                                DATE: 12/22/22 Therapeutic Exercise: Standing L x 10 reps W x 10 reps Retraction against ball x 10 reps Seated Levator stretch with self overpressure Sidelying ER with 2# weight x 12 reps x 2 sets Supine Small circles clockwise and counterclockwise x 30 seconds each direction bilat Manual Therapy: STM bilateral infraspinatus STM L upper trap, bilat rhomboids Trigger Point Dry-Needling  Treatment instructions: Expect mild to moderate muscle soreness. S/S of pneumothorax if dry needled over a lung field, and to seek immediate medical attention should they occur. Patient verbalized understanding of these instructions and education. Patient Consent Given: Yes Education handout provided: Previously provided Muscles treated: infraspinatus bilaterally, L upper trap Treatment response/outcome: palpable lengthening Self Care: Discussed continuation of HEP adding sidelying ER   [facility] Adult PT Treatment:                                                DATE: 12/15/22 Therapeutic Exercise: Supine Coregous ball with head nods, A/ROM rotation, chin tucks, alternating shoulder flexion, W x 10 reps supine  Prone W x 10 reps T x 10 T with ER x 10 Y x 10 I x 10 Manual Therapy: STM suboccipital release supine Manual cervical traction End range cervical flexion P/ROM Sidelying scapular mobilization   [facility] Adult PT Treatment:                                                DATE: 11/28/22 Therapeutic Exercise: Supine head on coregous ball: nods x 15, cervical retraction x 10, cervical rotation AROM , bear hug x 10, alt  shoulder flexion x10 Prone: T x  10, T with ER x 10, Y x 10 Manual Therapy: Suboccipital release supine Manual cervical traction with 8 second holds intermittent x 5 minutes End range neck flexion Therapeutic Activity: FOTO 59 Review HEP  PATIENT EDUCATION:  Education details: HEP Person educated: Patient Education method: Explanation, Demonstration, and Handouts Education comprehension: verbalized understanding and returned demonstration  HOME EXERCISE PROGRAM: Access Code: [email or web address] URL: [email or web address] Date: 12/22/2022 Prepared by: Margretta Ditty  Exercises - Sidelying Open Book Thoracic Lumbar Rotation and Extension  - 2 x daily - 7 x weekly - 1 sets - 5 reps - Pigeon Pose  - 2 x daily - 7 x weekly - 1 sets - 10 reps - Standing with Forearms Thoracic Rotation  - 1 x daily - 7 x weekly - 1 sets - 10 reps - Standing Quadratus Lumborum Stretch with Doorway  - 1 x daily - 7 x weekly - 1 sets - 3 reps - 30 seconds hold - Standing Glute Med Mobilization with Small Ball on Wall  - 1 x daily - 7 x weekly - 1 sets - 10 reps - Cervical Retraction at Wall  - 1 x daily - 7 x weekly - 1 sets - 10 reps - Single Leg Balance with Clock Reach  - 2 x daily - 7 x weekly - 1 sets - 10 reps - Split Squats Upright Trunk with Dumbbells (Quad Bias)  - 2 x daily - 7 x weekly - 1 sets - 10 reps - Shoulder External Rotation and Scapular Retraction with Resistance  - 2 x daily - 7 x weekly - 2 sets - 10 reps - Supine Head Nod with Deep Neck Flexor Activation  - 1 x daily - 7 x weekly - 3 sets - 10 reps - Supine Cervical Retraction with Towel  - 1 x daily - 7 x weekly - 3 sets - 10 reps - Prone Scapular Slide with Shoulder Extension  - 1 x daily - 7 x weekly - 3 sets - 10 reps - Prone Scapular Retraction Arms at Side  - 1 x daily - 7 x weekly - 3 sets - 10 reps - Prone W Scapular Retraction  - 1 x daily - 7 x weekly - 3 sets - 10 reps - Prone Scapular Retraction Y  - 1 x daily - 7 x  weekly - 3 sets - 10 reps - Sidelying Shoulder ER with Towel and Dumbbell  - 2 x daily - 7 x weekly - 1 sets - 10 reps  ASSESSMENT:  CLINICAL IMPRESSION: The patient continues with pain, however, she is demonstrating the ability to self manage symptoms noting reduction in pain with there ex. She continues with tightness and painful palpation along bilat infraspinatus musculature-- PT added external rotation sidelying to HEP.  Plan to continue working to LTGs.  OBJECTIVE IMPAIRMENTS: increased fascial restrictions, impaired flexibility, and pain.   GOALS: Goals reviewed with patient? Yes  LONG TERM GOALS: Target date: 01/09/2023  The patient will be indep with HEP progression. Baseline:  initiated at eval Goal status: IN PROGRESS  2.  The patient will report no resting pain. Baseline: 3-4/10 Goal status: IN PROGRESS  3.  The patient will improve FOTO up to 68% Baseline: 63% Goal status: IN PROGRESS  4.  The patient will be able to walk for 20 minutes nonstop without increase in pain. Baseline:  pain with prolonged walking Goal status: MET-- this can vary  5.  The patient will be able to lift  10 lbs overhead to demo improved UE strength. Baseline:  Notes feeling weaker with overhead tasks. Goal status: MET  PLAN:  PT FREQUENCY: 1x/week  PT DURATION: 8 weeks  PLANNED INTERVENTIONS: Therapeutic exercises, Therapeutic activity, Neuromuscular re-education, Balance training, Gait training, Patient/Family education, Self Care, Joint mobilization, Dry Needling, Electrical stimulation, Spinal mobilization, Cryotherapy, Moist heat, Taping, and Manual therapy  PLAN FOR NEXT SESSION: overhead strengthening, postural strengthening, Core activation. Discuss cervical region and consider DN + stretching L upper trap    Ashyra Cantin, PT 12/22/2022, 1:22 PM

## 2022-12-23 ENCOUNTER — Other Ambulatory Visit: Payer: Self-pay | Admitting: Physician Assistant

## 2022-12-23 NOTE — Telephone Encounter (Signed)
Last Fill: 08/25/2022  Eye exam: 09/09/2022 WNL   Labs: 12/02/2022 CBC WNL. Creatinine is borderline elevated-1.03 and GFR is slightly low-56. Total protein remains low, albumin is low.   Next Visit: 05/14/2023  Last Visit: 12/02/2022  NG:[id number] arthritis involving multiple sites with positive rheumatoid factor   Current Dose per office note 12/02/2022: Plaquenil 200 mg 1 tablet by mouth twice daily Monday to Friday   Okay to refill Plaquenil?

## 2022-12-29 ENCOUNTER — Ambulatory Visit: Payer: Medicare Other | Admitting: Rehabilitative and Restorative Service Providers"

## 2022-12-29 ENCOUNTER — Encounter: Payer: Self-pay | Admitting: Rehabilitative and Restorative Service Providers"

## 2022-12-29 DIAGNOSIS — M6281 Muscle weakness (generalized): Secondary | ICD-10-CM

## 2022-12-29 DIAGNOSIS — M542 Cervicalgia: Secondary | ICD-10-CM

## 2022-12-29 DIAGNOSIS — M5459 Other low back pain: Secondary | ICD-10-CM

## 2022-12-29 NOTE — Therapy (Signed)
OUTPATIENT PHYSICAL THERAPY CERVICAL TREATMENT   Patient Name: Barabra Timp MRN: [id number] DOB:[birth year], 77 y.o., female Today's Date: 12/29/2022  END OF SESSION:  PT End of Session - 12/29/22 1315     Visit Number 12    Number of Visits 16    Date for PT Re-Evaluation 01/09/23    Authorization Type [employer] medicare    Progress Note Due on Visit 20    PT Start Time 1315    PT Stop Time 1358    PT Time Calculation (min) 43 min    Activity Tolerance Patient tolerated treatment well    Behavior During Therapy WFL for tasks assessed/performed            Past Medical History:  Diagnosis Date   Avulsion fracture of left talus 09/05/2019   Constipation    Hepatitis    History of UTI 11/22/2020   Recent event, fatigue w/o dysuria, symptoms resolved with antibiotic (name not recalled).  Urine POC dipstick normal today.    Joint pain    Past Surgical History:  Procedure Laterality Date   BREAST BIOPSY Bilateral    benign   CATARACT EXTRACTION Right 03/2020   LAPAROSCOPY     LIVER BIOPSY     OOPHORECTOMY     right    TONSILLECTOMY     age 2    Patient Active Problem List   Diagnosis Date Noted   Voice complaint 11/11/2022   History of smoking 11/11/2022   Forgetfulness 11/11/2022   Bilateral foot pain 11/11/2022   Lumbar spondylosis 08/29/2022   COVID-19 virus infection 01/30/2022   Thyroid nodules; small, benign appearance by [place] 2023 12/10/2021   Vasomotor rhinitis 12/10/2021   Insomnia disorder 10/31/2021   Fear of vaccinations 02/22/2021   History of malignant neoplasm of skin 11/22/2020   Age-related osteoporosis without fracture 11/22/2020   Major depression in remission (HCC) 11/22/2020   History of hepatitis C 04/05/2020   Cervical spondylosis with radiculopathy 02/21/2020   Rheumatoid factor positive 02/17/2020   Laryngopharyngeal reflux (LPR) 12/20/2019   Primary osteoarthritis of left knee 07/19/2019   Subacromial bursitis of left shoulder joint  07/19/2019   Bilateral wrist pain, 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 05/24/2019   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   Chronic right shoulder pain 11/12/2017   Pain of right sacroiliac joint 09/10/2017   Chronic right hip pain 08/11/2017   PCP: Miguel Aschoff, MD REFERRING PROVIDER: Rodney Langton, MD REFERRING DIAG: [phone] (ICD-10-CM) - Cervical spondylosis with radiculopathy  THERAPY DIAG:  Cervicalgia  Muscle weakness (generalized)  Other low back pain  Rationale for Evaluation and Treatment: Rehabilitation ONSET DATE: 08/29/22 (referral date due to chronic issue)  SUBJECTIVE:  SUBJECTIVE STATEMENT: The patient traveled over the weekend and has 8/10 R inferior shoulder blade pain. She reports pain is "under my wing" in the R shoulder blade. PERTINENT HISTORY:  Rheumatoid arthritis PAIN:  Are you having pain? Yes: NPRS scale: 8 /10 Pain location: R shoulder blade Pain description: achiness, pain worse with prolonged position Aggravating factors: walking Relieving factors: no pain sitting PRECAUTIONS: None WEIGHT BEARING RESTRICTIONS: No FALLS:  Has patient fallen in last 6 months? No LIVING ENVIRONMENT: Lives with: lives with their spouse Lives in: House/apartment PLOF: Independent PATIENT GOALS: reduce pain  OBJECTIVE:  (Measures in this section from initial evaluation unless otherwise noted) PATIENT SURVEYS:  FOTO=65% (goal is 68%) SENSATION: WFL POSTURE: No Significant postural limitations PALPATION: Tender along R lateral border of sacrum, R QL, R glut med, R piriformis CERVICAL ROM:  WFLs, although does note pain after laying prone from neck positioning. LOWER EXTREMITY STRENGTH: 5/5 B hip flexion, B knee extension, B knee flexion, and ankle DF. LUMBAR  ROM:  WNLs ROM, pain with R lateral trunk lean on R side, tightness on R noted with L sidebending. 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  [facility] Adult PT Treatment:                                                DATE: 12/29/22 Modalities: TENS unit for e-stim unattended with moist heat due to acute pain today. Manual Therapy: STM latissimus dorsi, infraspinatus, upper trap, rhomboids, subscapularis Sidelying scapular mobilization Trigger Point Dry Needling Treatment: Pre-treatment instruction: Patient instructed on dry needling rationale, procedures, and possible side effects including pain during treatment (achy,cramping feeling), bruising, drop of blood, lightheadedness, nausea, sweating. Patient Consent Given: Yes Education handout provided: Previously provided Muscles treated: latissimus, infraspinatus, upper trap  Treatment response/outcome: Twitch response elicited and Palpable decrease in muscle tension Post-treatment instructions: Patient instructed to expect possible mild to moderate muscle soreness later today and/or tomorrow. Patient instructed in methods to reduce muscle soreness and to continue prescribed HEP. If patient was dry needled over the lung field, patient was instructed on signs and symptoms of pneumothorax and, however unlikely, to see immediate medical attention should they occur. Patient was also educated on signs and symptoms of infection and to seek medical attention should they occur. Patient verbalized understanding of these instructions and education.   [facility] Adult PT Treatment:                                                DATE: 12/22/22 Therapeutic Exercise: Standing L x 10 reps W x 10 reps Retraction against ball x 10 reps Seated Levator stretch with self overpressure Sidelying ER with 2# weight x 12 reps x 2 sets Supine Small  circles clockwise and counterclockwise x 30 seconds each direction bilat Manual Therapy: STM bilateral infraspinatus STM L upper trap, bilat rhomboids Trigger Point Dry-Needling  Treatment instructions: Expect mild to moderate muscle soreness. S/S of pneumothorax if dry needled over a lung field, and to seek immediate medical attention should they occur. Patient verbalized understanding of these instructions and education. Patient Consent Given: Yes Education handout  provided: Previously provided Muscles treated: infraspinatus bilaterally, L upper trap Treatment response/outcome: palpable lengthening Self Care: Discussed continuation of HEP adding sidelying ER   [facility] Adult PT Treatment:                                                DATE: 12/15/22 Therapeutic Exercise: Supine Coregous ball with head nods, A/ROM rotation, chin tucks, alternating shoulder flexion, W x 10 reps supine  Prone W x 10 reps T x 10 T with ER x 10 Y x 10 I x 10 Manual Therapy: STM suboccipital release supine Manual cervical traction End range cervical flexion P/ROM Sidelying scapular mobilization   [facility] Adult PT Treatment:                                                DATE: 11/28/22 Therapeutic Exercise: Supine head on coregous ball: nods x 15, cervical retraction x 10, cervical rotation AROM , bear hug x 10, alt shoulder flexion x10 Prone: T x 10, T with ER x 10, Y x 10 Manual Therapy: Suboccipital release supine Manual cervical traction with 8 second holds intermittent x 5 minutes End range neck flexion Therapeutic Activity: FOTO 59 Review HEP  PATIENT EDUCATION:  Education details: HEP Person educated: Patient Education method: Programmer, multimedia, Facilities manager, and Handouts Education comprehension: verbalized understanding and returned demonstration  HOME EXERCISE PROGRAM: Access Code: [email or web address] URL: [email or web address] Date: 12/22/2022 Prepared by: Margretta Ditty  Exercises - Sidelying Open Book Thoracic Lumbar Rotation and Extension  - 2 x daily - 7 x weekly - 1 sets - 5 reps - Pigeon Pose  - 2 x daily - 7 x weekly - 1 sets - 10 reps - Standing with Forearms Thoracic Rotation  - 1 x daily - 7 x weekly - 1 sets - 10 reps - Standing Quadratus Lumborum Stretch with Doorway  - 1 x daily - 7 x weekly - 1 sets - 3 reps - 30 seconds hold - Standing Glute Med Mobilization with Small Ball on Wall  - 1 x daily - 7 x weekly - 1 sets - 10 reps - Cervical Retraction at Wall  - 1 x daily - 7 x weekly - 1 sets - 10 reps - Single Leg Balance with Clock Reach  - 2 x daily - 7 x weekly - 1 sets - 10 reps - Split Squats Upright Trunk with Dumbbells (Quad Bias)  - 2 x daily - 7 x weekly - 1 sets - 10 reps - Shoulder External Rotation and Scapular Retraction with Resistance  - 2 x daily - 7 x weekly - 2 sets - 10 reps - Supine Head Nod with Deep Neck Flexor Activation  - 1 x daily - 7 x weekly - 3 sets - 10 reps - Supine Cervical Retraction with Towel  - 1 x daily - 7 x weekly - 3 sets - 10 reps - Prone Scapular Slide with Shoulder Extension  - 1 x daily - 7 x weekly - 3 sets - 10 reps - Prone Scapular Retraction Arms at Side  - 1 x daily - 7 x weekly - 3 sets - 10 reps - Prone W Scapular Retraction  - 1 x daily - 7  x weekly - 3 sets - 10 reps - Prone Scapular Retraction Y  - 1 x daily - 7 x weekly - 3 sets - 10 reps - Sidelying Shoulder ER with Towel and Dumbbell  - 2 x daily - 7 x weekly - 1 sets - 10 reps  ASSESSMENT:  CLINICAL IMPRESSION: The patient arrived today with acute 8/10 pain that reduced to 1/10 pain at end of session. We discussed home activities for strengthening that she has with current HEP. Patient and PT to continue to add ER in sidelying and lat pulls with band to HEP due to ongoing shoulder/scapular pain.  Plan to continue working to LTGs.  OBJECTIVE IMPAIRMENTS: increased fascial restrictions, impaired flexibility, and pain.    GOALS: Goals reviewed with patient? Yes  LONG TERM GOALS: Target date: 01/09/2023  The patient will be indep with HEP progression. Baseline:  initiated at eval Goal status: IN PROGRESS  2.  The patient will report no resting pain. Baseline: 3-4/10 Goal status: IN PROGRESS  3.  The patient will improve FOTO up to 68% Baseline: 63% Goal status: IN PROGRESS  4.  The patient will be able to walk for 20 minutes nonstop without increase in pain. Baseline:  pain with prolonged walking Goal status: MET-- this can vary  5.  The patient will be able to lift 10 lbs overhead to demo improved UE strength. Baseline:  Notes feeling weaker with overhead tasks. Goal status: MET  PLAN:  PT FREQUENCY: 1x/week  PT DURATION: 8 weeks  PLANNED INTERVENTIONS: Therapeutic exercises, Therapeutic activity, Neuromuscular re-education, Balance training, Gait training, Patient/Family education, Self Care, Joint mobilization, Dry Needling, Electrical stimulation, Spinal mobilization, Cryotherapy, Moist heat, Taping, and Manual therapy  PLAN FOR NEXT SESSION: overhead strengthening, postural strengthening, Core activation. Add sidelying ER and lat pulls with band to HEP.   Jaquell Seddon, PT 12/29/2022, 1:15 PM

## 2023-01-07 ENCOUNTER — Ambulatory Visit: Payer: Medicare Other | Attending: Sports Medicine | Admitting: Rehabilitative and Restorative Service Providers"

## 2023-01-07 ENCOUNTER — Encounter: Payer: Self-pay | Admitting: Rehabilitative and Restorative Service Providers"

## 2023-01-07 DIAGNOSIS — M6281 Muscle weakness (generalized): Secondary | ICD-10-CM | POA: Insufficient documentation

## 2023-01-07 DIAGNOSIS — M542 Cervicalgia: Secondary | ICD-10-CM | POA: Diagnosis present

## 2023-01-07 NOTE — Therapy (Unsigned)
OUTPATIENT PHYSICAL THERAPY CERVICAL TREATMENT and DISCHARGE SUMMARY  Patient Name: Kristen Jacobs MRN: [id number] DOB:1946/01/02, 77 y.o., female Today's Date: 01/07/2023  END OF SESSION:  PT End of Session - 01/07/23 1140     Visit Number 13    Number of Visits 16    Date for PT Re-Evaluation 01/09/23    Authorization Type [employer] medicare    Progress Note Due on Visit 20    PT Start Time 1110    PT Stop Time 1150    PT Time Calculation (min) 40 min    Activity Tolerance Patient tolerated treatment well    Behavior During Therapy WFL for tasks assessed/performed            Past Medical History:  Diagnosis Date   Avulsion fracture of left talus 09/05/2019   Constipation    Hepatitis    History of UTI 11/22/2020   Recent event, fatigue w/o dysuria, symptoms resolved with antibiotic (name not recalled).  Urine POC dipstick normal today.    Joint pain    Past Surgical History:  Procedure Laterality Date   BREAST BIOPSY Bilateral    benign   CATARACT EXTRACTION Right 03/2020   LAPAROSCOPY     LIVER BIOPSY     OOPHORECTOMY     right    TONSILLECTOMY     age 77    Patient Active Problem List   Diagnosis Date Noted   Voice complaint 11/11/2022   History of smoking 11/11/2022   Forgetfulness 11/11/2022   Bilateral foot pain 11/11/2022   Lumbar spondylosis 08/29/2022   COVID-19 virus infection 01/30/2022   Thyroid nodules; small, benign appearance by [place] 2023 12/10/2021   Vasomotor rhinitis 12/10/2021   Insomnia disorder 10/31/2021   Fear of vaccinations 02/22/2021   History of malignant neoplasm of skin 11/22/2020   Age-related osteoporosis without fracture 11/22/2020   Major depression in remission (HCC) 11/22/2020   History of hepatitis C 04/05/2020   Cervical spondylosis with radiculopathy 02/21/2020   Rheumatoid factor positive 02/17/2020   Laryngopharyngeal reflux (LPR) 12/20/2019   Primary osteoarthritis of left knee 07/19/2019   Subacromial bursitis of left  shoulder joint 07/19/2019   Bilateral wrist pain, 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 She reports pain is "under my wing" in the R shoulder blade. PERTINENT HISTORY:  Rheumatoid arthritis PAIN:  Are you having pain? Yes: NPRS scale: 8 /10 Pain location: R shoulder blade Pain description: achiness, pain worse with prolonged position Aggravating factors: walking Relieving factors: no pain sitting PRECAUTIONS: None WEIGHT BEARING RESTRICTIONS: No FALLS:  Has patient fallen in last 6 months? No PATIENT GOALS: reduce pain  OBJECTIVE:  (Measures in this section from initial evaluation unless otherwise noted) PATIENT SURVEYS:  FOTO=65% (goal is 68%) SENSATION: WFL POSTURE: No Significant postural limitations PALPATION: Tender along R lateral border of sacrum, R QL, R glut med, R piriformis CERVICAL ROM:  WFLs, although does note pain after laying prone from neck positioning. LOWER EXTREMITY STRENGTH: 5/5 B hip flexion, B knee extension, B knee flexion, and ankle DF. LUMBAR ROM:  WNLs ROM, pain with R lateral trunk lean on R side, tightness on R noted with L sidebending. 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   [facility] Adult PT Treatment:                                                DATE: 01/07/23 Therapeutic Exercise: Standing Lat pulls green band x 12 reps W and then with arms straight-- added to HEP Prone W x 12 reps T x 12 reps Shoulder extension  Manual Therapy: Stm R scapular region (infraspinatus, rhomboids, upper trap) Trigger Point Dry Needling Treatment: Pre-treatment instruction: Patient instructed on dry needling rationale, procedures, and possible side effects including pain during treatment (achy,cramping feeling), bruising, drop of blood, lightheadedness, nausea, sweating. Patient Consent Given: Yes Education handout provided: Previously provided Muscles treated: infraspinatus x 2  Treatment response/outcome: Palpable decrease in muscle tension Post-treatment instructions: Patient instructed to expect possible mild to moderate muscle soreness later today and/or tomorrow. Patient instructed in methods to reduce muscle soreness and to continue prescribed HEP. Patient verbalized understanding of these instructions and education.   [facility] Adult PT Treatment:                                                DATE: 12/29/22 Modalities: TENS unit for e-stim unattended with moist heat due to acute pain today. Manual Therapy: STM latissimus dorsi, infraspinatus, upper trap, rhomboids, subscapularis Sidelying scapular mobilization Trigger Point Dry Needling Treatment: Pre-treatment instruction: Patient instructed on dry needling rationale, procedures, and possible side effects including pain during treatment (achy,cramping feeling), bruising, drop of blood, lightheadedness, nausea, sweating. Patient Consent Given: Yes Education handout provided: Previously provided Muscles treated: latissimus, infraspinatus, upper trap  Treatment  response/outcome: Twitch response elicited and Palpable decrease in muscle tension Post-treatment instructions: Patient instructed to expect possible mild to moderate muscle soreness later today and/or tomorrow. Patient instructed in methods to reduce muscle soreness and to continue prescribed HEP.  Patient verbalized understanding of these instructions and education.   [facility] Adult PT Treatment:  DATE: 12/22/22 Therapeutic Exercise: Standing L x 10 reps W x 10 reps Retraction against ball x 10 reps Seated Levator stretch with self overpressure Sidelying ER with 2# weight x 12 reps x 2 sets Supine Small circles clockwise and counterclockwise x 30 seconds each direction bilat Manual Therapy: STM bilateral infraspinatus STM L upper trap, bilat rhomboids Trigger Point Dry-Needling  Treatment instructions: Expect mild to moderate muscle soreness. S/S of pneumothorax if dry needled over a lung field, and to seek immediate medical attention should they occur. Patient verbalized understanding of these instructions and education. Patient Consent Given: Yes Education handout provided: Previously provided Muscles treated: infraspinatus bilaterally, L upper trap Treatment response/outcome: palpable lengthening Self Care: Discussed continuation of HEP adding sidelying ER  PATIENT EDUCATION:  Education details: HEP Person educated: Patient Education method: Programmer, multimedia, Facilities manager, and Handouts Education comprehension: verbalized understanding and returned demonstration  HOME EXERCISE PROGRAM: Access Code: [email or web address] URL: [email or web address] Date: 01/07/2023 Prepared by: Margretta Ditty  Program Notes Muscle: infraspinatus muscle and quadratus lumborum  Exercises - Sidelying Open Book Thoracic Lumbar Rotation and Extension  - 2 x daily - 7 x weekly - 1 sets - 5 reps - Pigeon Pose  - 2 x daily - 7 x weekly - 1 sets - 10  reps - Standing with Forearms Thoracic Rotation  - 1 x daily - 7 x weekly - 1 sets - 10 reps - Standing Quadratus Lumborum Stretch with Doorway  - 1 x daily - 7 x weekly - 1 sets - 3 reps - 30 seconds hold - Standing Glute Med Mobilization with Small Ball on Wall  - 1 x daily - 7 x weekly - 1 sets - 10 reps - Single Leg Balance with Clock Reach  - 2 x daily - 7 x weekly - 1 sets - 10 reps - Split Squats Upright Trunk with Dumbbells (Quad Bias)  - 2 x daily - 7 x weekly - 1 sets - 10 reps - Shoulder External Rotation and Scapular Retraction with Resistance  - 2 x daily - 7 x weekly - 2 sets - 10 reps - Standing Lat Pull Down with Resistance - Elbows Bent  - 1 x daily - 7 x weekly - 1 sets - 10 reps - Staggered Lat Pull Down with Resistance - Arms Straight  - 1 x daily - 7 x weekly - 1 sets - 10 reps - Supine Head Nod with Deep Neck Flexor Activation  - 1 x daily - 7 x weekly - 3 sets - 10 reps - Prone Scapular Retraction Arms at Side  - 1 x daily - 7 x weekly - 3 sets - 10 reps - Prone W Scapular Retraction  - 1 x daily - 7 x weekly - 3 sets - 10 reps - Prone Scapular Retraction Y  - 1 x daily - 7 x weekly - 3 sets - 10 reps - Sidelying Shoulder ER with Towel and Dumbbell  - 2 x daily - 7 x weekly - 1 sets - 10 reps  ASSESSMENT:  CLINICAL IMPRESSION: The patient has met 4 LTGs and partially met 1 LTG-- she has some variability in pain depending on activity level. The patient is participating in pilates and is progressing with HEP. Plan to d/c today.   OBJECTIVE IMPAIRMENTS: increased fascial restrictions, impaired flexibility, and pain.   GOALS: Goals reviewed with patient? Yes  LONG TERM GOALS: Target date: 01/09/2023  The patient will be indep with HEP progression. Baseline:  initiated at  eval Goal status: MET  2.  The patient will report no resting pain. Baseline: 3-4/10 Goal status:PARTIALLY MET  1-2/10 "it's manageable"  3.  The patient will improve FOTO up to 68% Baseline: 63%,  up to 68% at discharge Goal status: MET  4.  The patient will be able to walk for 20 minutes nonstop without increase in pain. Baseline:  pain with prolonged walking Goal status: MET-- this can vary  5.  The patient will be able to lift 10 lbs overhead to demo improved UE strength. Baseline:  Notes feeling weaker with overhead tasks. Goal status: MET  PLAN:  PT FREQUENCY: 1x/week  PT DURATION: 8 weeks  PLANNED INTERVENTIONS: Therapeutic exercises, Therapeutic activity, Neuromuscular re-education, Balance training, Gait training, Patient/Family education, Self Care, Joint mobilization, Dry Needling, Electrical stimulation, Spinal mobilization, Cryotherapy, Moist heat, Taping, and Manual therapy  PLAN FOR NEXT SESSION: discharge today.   Stevie Ertle, PT 01/07/2023, 1:23 PM

## 2023-01-09 ENCOUNTER — Ambulatory Visit (HOSPITAL_COMMUNITY): Payer: Medicare Other

## 2023-01-12 ENCOUNTER — Other Ambulatory Visit: Payer: Self-pay | Admitting: *Deleted

## 2023-01-12 ENCOUNTER — Ambulatory Visit (HOSPITAL_COMMUNITY)
Admission: RE | Admit: 2023-01-12 | Discharge: 2023-01-12 | Disposition: A | Discharge status: Home / Self Care | Payer: Medicare Other | Source: Ambulatory Visit | Attending: Internal Medicine | Admitting: Internal Medicine

## 2023-01-12 DIAGNOSIS — F17210 Nicotine dependence, cigarettes, uncomplicated: Secondary | ICD-10-CM | POA: Insufficient documentation

## 2023-01-12 DIAGNOSIS — Z87891 Personal history of nicotine dependence: Secondary | ICD-10-CM | POA: Diagnosis not present

## 2023-01-12 DIAGNOSIS — I7121 Aneurysm of the ascending aorta, without rupture: Secondary | ICD-10-CM | POA: Insufficient documentation

## 2023-01-12 DIAGNOSIS — Z79899 Other long term (current) drug therapy: Secondary | ICD-10-CM

## 2023-01-12 DIAGNOSIS — I700 Atherosclerosis of aorta: Secondary | ICD-10-CM | POA: Insufficient documentation

## 2023-01-12 DIAGNOSIS — Z122 Encounter for screening for malignant neoplasm of respiratory organs: Secondary | ICD-10-CM | POA: Diagnosis not present

## 2023-01-12 DIAGNOSIS — J439 Emphysema, unspecified: Secondary | ICD-10-CM | POA: Diagnosis not present

## 2023-01-13 LAB — BASIC METABOLIC PANEL WITH GFR
BUN: 19 mg/dL (ref 7–25)
CO2: 26 mmol/L (ref 20–32)
Calcium: 9.3 mg/dL (ref 8.6–10.4)
Chloride: 105 mmol/L (ref 98–110)
Creat: 0.95 mg/dL (ref 0.60–1.00)
Glucose, Bld: 88 mg/dL (ref 65–99)
Potassium: 4.1 mmol/L (ref 3.5–5.3)
Sodium: 138 mmol/L (ref 135–146)
eGFR: 61 mL/min/{1.73_m2} (ref >=60)

## 2023-01-13 NOTE — Progress Notes (Signed)
BMP is normal.

## 2023-01-23 ENCOUNTER — Encounter: Payer: Self-pay | Admitting: Internal Medicine

## 2023-01-30 NOTE — Telephone Encounter (Signed)
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  No modifiable risk factors - she is not hypertensive, not diabetic, quit smoking years ago.

## 2023-02-05 ENCOUNTER — Encounter: Payer: Self-pay | Admitting: Internal Medicine

## 2023-03-09 ENCOUNTER — Other Ambulatory Visit: Payer: Self-pay

## 2023-03-09 DIAGNOSIS — F419 Anxiety disorder, unspecified: Secondary | ICD-10-CM

## 2023-03-09 DIAGNOSIS — F5101 Primary insomnia: Secondary | ICD-10-CM

## 2023-03-09 MED ORDER — TRAZODONE HCL 100 MG PO TABS: 100.0000 mg | ORAL_TABLET | Freq: Every day | ORAL | 3 refills | Status: DC

## 2023-03-09 MED ORDER — CITALOPRAM HYDROBROMIDE 20 MG PO TABS
20.0000 mg | ORAL_TABLET | Freq: Every day | ORAL | 3 refills | Status: DC
Start: 2023-03-09 — End: 2024-01-01

## 2023-03-13 ENCOUNTER — Other Ambulatory Visit: Payer: Self-pay | Admitting: Medical Genetics

## 2023-03-13 DIAGNOSIS — Z006 Encounter for examination for normal comparison and control in clinical research program: Secondary | ICD-10-CM

## 2023-03-14 ENCOUNTER — Encounter: Payer: Self-pay | Admitting: Podiatry

## 2023-03-27 ENCOUNTER — Encounter: Payer: Self-pay | Admitting: Internal Medicine

## 2023-04-09 ENCOUNTER — Ambulatory Visit: Payer: Medicare Other | Admitting: Podiatry

## 2023-04-10 ENCOUNTER — Other Ambulatory Visit (HOSPITAL_COMMUNITY): Payer: Medicare Other

## 2023-04-12 NOTE — Progress Notes (Unsigned)
No chief complaint on file.  History of Present Illness: 77 yo female with history of depression who is here today as a new consult, referred by Dr. ***, for the evaluation of dilated thoracic aorta. Lung screening chest CT on 01/25/23 with 4 cm ascending aorta. Evidence of coronary artery calcification and aortic atherosclerosis.   Primary Care Physician: Miguel Aschoff, MD   Past Medical History:  Diagnosis Date   Avulsion fracture of left talus 09/05/2019   Bilateral foot pain    Constipation    Depression    Forgetfulness    Hepatitis    History of UTI 11/22/2020   Recent event, fatigue w/o dysuria, symptoms resolved with antibiotic (name not recalled).  Urine POC dipstick normal today.    Joint pain    Laryngopharyngeal reflux     Past Surgical History:  Procedure Laterality Date   BREAST BIOPSY Bilateral    benign   CATARACT EXTRACTION Right 03/2020   LAPAROSCOPY     LIVER BIOPSY     OOPHORECTOMY     right    TONSILLECTOMY     age 63     Current Outpatient Medications  Medication Sig Dispense Refill   Ascorbic Acid (VITAMIN C) 100 MG CHEW Chew by mouth.     b complex vitamins capsule Take 1 capsule by mouth daily.     Cholecalciferol (VITAMIN D3 PO) Take 5,000 Units by mouth daily.     citalopram (CELEXA) 20 MG tablet Take 1 tablet (20 mg total) by mouth daily. 90 tablet 3   EPINEPHrine (EPIPEN 2-PAK) 0.3 mg/0.3 mL IJ SOAJ injection Inject 0.3 mg into the muscle as needed for anaphylaxis. 1 each 2   Glucosamine 500 MG CAPS Take 1 capsule by mouth. (Wild Shrimp Glucosamine)     hydroxychloroquine (PLAQUENIL) 200 MG tablet TAKE 1 TABLET BY MOUTH TWICE  DAILY MONDAY THROUGH FRIDAY  ONLY. NONE ON SATURDAY OR SUNDAY 120 tablet 0   ketoconazole (NIZORAL) 2 % shampoo Apply topically as needed.     methocarbamol (ROBAXIN) 500 MG tablet Take 1 tablet (500 mg total) by mouth 3 (three) times daily. (Muscle relaxer) 90 tablet 3   MISC NATURAL PRODUCTS PO Take 3  capsules by mouth daily. Bone Strength: Vitamin D3, Vitamin K1, Vitamin K2, Calcium, MAgnesium, Strontium, Vanadium     Omega-3 Fatty Acids (FISH OIL) 1200 MG CAPS Take 1 capsule by mouth daily. (Patient not taking: Reported on 12/02/2022)     OVER THE COUNTER MEDICATION at bedtime. Triphala (anti inflammatory)     Prasterone, DHEA, (DHEA PO) Take by mouth daily.     traMADol (ULTRAM) 50 MG tablet Take 1 tablet (50 mg total) by mouth every 12 (twelve) hours as needed. 60 tablet 3   traZODone (DESYREL) 100 MG tablet Take 1 tablet (100 mg total) by mouth at bedtime. 90 tablet 3   UNABLE TO FIND Med Name: bio identials     Zinc 10 MG LOZG Use as directed in the mouth or throat.     No current facility-administered medications for this visit.    Allergies  Allergen Reactions   Other     Other reaction(s): Other (See Comments)   Sulfa Antibiotics     Social History   Socioeconomic History   Marital status: Married    Spouse name: Not on file   Number of children: Not on file   Years of education: Not on file   Highest education level: Not on file  Occupational History   Not on file  Tobacco Use   Smoking status: Former    Current packs/day: 0.00    Average packs/day: 3.0 packs/day for 26.0 years (78.0 ttl pk-yrs)    Types: Cigarettes    Start date: 79    Quit date: 60    Years since quitting: 34.9    Passive exposure: Current (Husband smokes cigars)   Smokeless tobacco: Never  Vaping Use   Vaping status: Never Used  Substance and Sexual Activity   Alcohol use: Not Currently    Comment: quit 1990   Drug use: Not Currently    Types: Marijuana   Sexual activity: Not on file  Other Topics Concern   Not on file  Social History Narrative   From [place], first generation American, 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  Moved to [facility] in late 2010[place].  Enjoys healthful living, Mediterranean diet, staying active.  First husband passed away due to complications from diabetes.   She has since remarried.   Social Determinants of Health   Financial Resource Strain: Low Risk  (11/11/2022)   Overall Financial Resource Strain (CARDIA)    Difficulty of Paying Living Expenses: Not very hard  Food Insecurity: No Food Insecurity (11/11/2022)   Hunger Vital Sign    Worried About Running Out of Food in the Last Year: Never true    Ran Out of Food in the Last Year: Never true  Transportation Needs: No Transportation Needs (11/11/2022)   PRAPARE - Administrator, Civil Service (Medical): No    Lack of Transportation (Non-Medical): No  Physical Activity: Sufficiently Active (11/11/2022)   Exercise Vital Sign    Days of Exercise per Week: 3 days    Minutes of Exercise per Session: 50 min  Stress: No Stress Concern Present (11/11/2022)   [employer] of Occupational Health - Occupational Stress Questionnaire    Feeling of Stress : Only a little  Social Connections: Socially Integrated (11/11/2022)   Social Connection and Isolation Panel [NHANES]    Frequency of Communication with Friends and Family: More than three times a week    Frequency of Social Gatherings with Friends and Family: More than three times a week    Attends Religious Services: More than 4 times per year    Active Member of [employer] or Organizations: Yes    Attends Engineer, structural: More than 4 times per year    Marital Status: Married  Catering manager Violence: Not At Risk (11/11/2022)   Humiliation, Afraid, Rape, and Kick questionnaire    Fear of Current or Ex-Partner: No    Emotionally Abused: No    Physically Abused: No    Sexually Abused: No    Family History  Problem Relation Age of Onset   Dementia Mother    Cancer Father    Bipolar disorder Sister    Leukemia Brother    Healthy Son    Asthma Son    Healthy Son    Breast cancer Neg Hx     Review of Systems:  As stated in the HPI and otherwise negative.   There were no vitals taken for this visit.  Physical  Examination: General: Well developed, well nourished, NAD  HEENT: OP clear, mucus membranes moist  SKIN: warm, dry. No rashes. Neuro: No focal deficits  Musculoskeletal: Muscle strength 5/5 all ext  Psychiatric: Mood and affect normal  Neck: No JVD, no carotid bruits, no thyromegaly, no lymphadenopathy.  Lungs:Clear bilaterally, no wheezes, rhonci,  crackles Cardiovascular: Regular rate and rhythm. No murmurs, gallops or rubs. Abdomen:Soft. Bowel sounds present. Non-tender.  Extremities: No lower extremity edema. Pulses are 2 + in the bilateral DP/PT.  EKG:  EKG {ACTION; IS/IS NWG:[id number]} ordered today. The ekg ordered today demonstrates ***  Recent Labs: 12/02/2022: ALT 10; Hemoglobin 12.9; Platelets 233 01/12/2023: BUN 19; Creat 0.95; Potassium 4.1; Sodium 138   Lipid Panel No results found for: "CHOL", "TRIG", "HDL", "CHOLHDL", "VLDL", "LDLCALC", "LDLDIRECT"   Wt Readings from Last 3 Encounters:  12/02/22 63.5 kg  11/11/22 63.3 kg  11/11/22 63.3 kg      Assessment and Plan:   1.   Labs/ tests ordered today include:  No orders of the defined types were placed in this encounter.    Disposition:   F/U with me in ***    Signed, Verne Carrow, MD, [facility] 04/12/2023 5:21 PM    [facility] [address] [place], [place], [place]  [address] Phone: [phone]; Fax: [phone]

## 2023-04-13 ENCOUNTER — Ambulatory Visit: Payer: Medicare Other | Admitting: Cardiovascular Disease

## 2023-04-13 NOTE — Progress Notes (Unsigned)
Cardiology Office Note:   Date:  04/13/2023  ID:  Kristen Jacobs, DOB 08-24-1945, MRN [id number] PCP:  Miguel Aschoff, MD  [facility] Providers Cardiologist:  Alverda Skeans, MD Referring MD: Miguel Aschoff, MD  Chief Complaint/Reason for Referral: Ascending aortic aneurysm and aortic atherosclerosis ASSESSMENT:    1. Aneurysm of ascending aorta without rupture (HCC)   2. Aortic atherosclerosis (HCC)   3. Hyperlipidemia LDL goal <70   4. CKD (chronic kidney disease) stage 2, GFR 60-89 ml/min   5. Rheumatoid arthritis with positive rheumatoid factor, involving unspecified site [facility])     PLAN:   In order of problems listed above: Ascending aortic aneurysm: Will obtain CT scan in 6 months which will inform frequency of serial imaging.  Obtain echocardiogram to evaluate for bicuspid valve. Aortic atherosclerosis: Start aspirin and atorvastatin 20 mg. Hyperlipidemia: Check lipid panel, LFTs, LP(a) in 2 months after starting atorvastatin as above. CKD stage II: ARB?*** Rheumatoid arthritis: On Plaquenil.        {Are you ordering a CV Procedure (e.g. stress test, cath, DCCV, TEE, etc)?   Press F2        :[id number]}   Dispo:  No follow-ups on file.      Medication Adjustments/Labs and Tests Ordered: Current medicines are reviewed at length with the patient today.  Concerns regarding medicines are outlined above.  The following changes have been made:  {PLAN; NO CHANGE:13088:s}   Labs/tests ordered: No orders of the defined types were placed in this encounter.   Medication Changes: No orders of the defined types were placed in this encounter.   Current medicines are reviewed at length with the patient today.  The patient {ACTIONS; HAS/DOES NOT HAVE:19233} concerns regarding medicines.  I spent *** minutes reviewing all clinical data during and prior to this visit including all relevant imaging studies, laboratories, clinical information from other health systems  and prior notes from both Cardiology and other specialties, interviewing the patient, conducting a complete physical examination, and coordinating care in order to formulate a comprehensive and personalized evaluation and treatment plan.   History of Present Illness:      FOCUSED PROBLEM LIST:   Thoracic aortic aneurysm 4 cm on chest CT September 2024 Aortic atherosclerosis Chest CT 2024 Hyperlipidemia Rheumatoid arthritis On Plaquenil BMI 22 CKD stage II  12/24: The patient is a 77 year old female with above listed medical problems who self-referred for recommendations regarding incidentally noted thoracic aortic aneurysm and aortic atherosclerosis on a chest CT done for lung cancer screening.          Current Medications: No outpatient medications have been marked as taking for the 04/14/23 encounter (Appointment) with Orbie Pyo, MD.     Review of Systems:   Please see the history of present illness.    All other systems reviewed and are negative.     EKGs/Labs/Other Test Reviewed:   EKG:    EKG Interpretation Date/Time:    Ventricular Rate:    PR Interval:    QRS Duration:    QT Interval:    QTC Calculation:   R Axis:      Text Interpretation:           Risk Assessment/Calculations:   {Does this patient have ATRIAL FIBRILLATION?:[phone]}      Physical Exam:   VS:  There were no vitals taken for this visit.   No BP recorded.  {Refresh Note OR Click here to enter BP  :1}***   Wt Readings from  Last 3 Encounters:  12/02/22 140 lb (63.5 kg)  11/11/22 139 lb 8 oz (63.3 kg)  11/11/22 139 lb 8 oz (63.3 kg)      GENERAL:  No apparent distress, AOx3 HEENT:  No carotid bruits, +2 carotid impulses, no scleral icterus CAR: RRR Irregular RR*** no murmurs***, gallops, rubs, or thrills RES:  Clear to auscultation bilaterally ABD:  Soft, nontender, nondistended, positive bowel sounds x 4 VASC:  +2 radial pulses, +2 carotid pulses NEURO:  CN 2-12  grossly intact; motor and sensory grossly intact PSYCH:  No active depression or anxiety EXT:  No edema, ecchymosis, or cyanosis  Signed, Orbie Pyo, MD  04/13/2023 12:14 PM    [facility] [address] [place], [place], [place]  [address] Phone: [phone]; Fax: [phone]   Note:  This document was prepared using Dragon voice recognition software and may include unintentional dictation errors.

## 2023-04-14 ENCOUNTER — Ambulatory Visit: Payer: Medicare Other | Attending: Internal Medicine | Admitting: Internal Medicine

## 2023-04-14 ENCOUNTER — Encounter: Payer: Self-pay | Admitting: Internal Medicine

## 2023-04-14 ENCOUNTER — Other Ambulatory Visit: Payer: Self-pay

## 2023-04-14 ENCOUNTER — Encounter: Payer: Self-pay | Admitting: Rheumatology

## 2023-04-14 VITALS — BP 110/56 | HR 62 | Ht 65.5 in | Wt 142.0 lb

## 2023-04-14 DIAGNOSIS — N182 Chronic kidney disease, stage 2 (mild): Secondary | ICD-10-CM | POA: Diagnosis not present

## 2023-04-14 DIAGNOSIS — I700 Atherosclerosis of aorta: Secondary | ICD-10-CM

## 2023-04-14 DIAGNOSIS — M059 Rheumatoid arthritis with rheumatoid factor, unspecified: Secondary | ICD-10-CM

## 2023-04-14 DIAGNOSIS — I7121 Aneurysm of the ascending aorta, without rupture: Secondary | ICD-10-CM | POA: Diagnosis not present

## 2023-04-14 DIAGNOSIS — E785 Hyperlipidemia, unspecified: Secondary | ICD-10-CM

## 2023-04-14 MED ORDER — ATORVASTATIN CALCIUM 40 MG PO TABS: 40.0000 mg | ORAL_TABLET | Freq: Every day | ORAL | 3 refills | Status: DC

## 2023-04-14 MED ORDER — ASPIRIN 81 MG PO TBEC
81.0000 mg | DELAYED_RELEASE_TABLET | Freq: Every day | ORAL | Status: AC
Start: 2023-04-14 — End: ?

## 2023-04-14 NOTE — Patient Instructions (Addendum)
Medication Instructions:  Your physician has recommended you make the following change in your medication:  1.) start aspirin 81 mg - take one tablet daily 2.) start atorvastatin (Lipitor) 40 - take one tablet daily  *If you need a refill on your cardiac medications before your next appointment, please call your pharmacy*   Lab Work: Go to [facility] in about 8 weeks (mid February) for blood work (Lipids, liver, Lpa)  You will also need blood work before the CT scan in July.  (BMET)   If you have labs (blood work) drawn today and your tests are completely normal, you will receive your results only by: MyChart Message (if you have MyChart) OR A paper copy in the mail If you have any lab test that is abnormal or we need to change your treatment, we will call you to review the results.   Testing/Procedures: Your physician has requested that you have an echocardiogram. Echocardiography is a painless test that uses sound waves to create images of your heart. It provides your doctor with information about the size and shape of your heart and how well your heart's chambers and valves are working. This procedure takes approximately one hour. There are no restrictions for this procedure. Please do NOT wear cologne, perfume, aftershave, or lotions (deodorant is allowed). Please arrive 15 minutes prior to your appointment time.  Please note: We ask at that you not bring children with you during ultrasound (echo/ vascular) testing. Due to room size and safety concerns, children are not allowed in the ultrasound rooms during exams. Our front office staff cannot provide observation of children in our lobby area while testing is being conducted. An adult accompanying a patient to their appointment will only be allowed in the ultrasound room at the discretion of the ultrasound technician under special circumstances. We apologize for any inconvenience.  Chest CT Angiogram - due in July 2025 before next visit  with Dr. Lynnette Caffey    Follow-Up: At [facility], you and your health needs are our priority.  As part of our continuing mission to provide you with exceptional heart care, we have created designated Provider Care Teams.  These Care Teams include your primary Cardiologist (physician) and Advanced Practice Providers (APPs -  Physician Assistants and Nurse Practitioners) who all work together to provide you with the care you need, when you need it.   Your next appointment:   6 month(s)  Provider:   Orbie Pyo, MD   (Chest CT prior to seeing doctor)

## 2023-04-15 NOTE — Telephone Encounter (Signed)
It is okay to take baby aspirin with Plaquenil.

## 2023-04-16 ENCOUNTER — Ambulatory Visit: Payer: Medicare Other | Admitting: Podiatry

## 2023-04-16 ENCOUNTER — Encounter: Payer: Self-pay | Admitting: Podiatry

## 2023-04-16 VITALS — Ht 65.5 in | Wt 142.0 lb

## 2023-04-16 DIAGNOSIS — B351 Tinea unguium: Secondary | ICD-10-CM | POA: Diagnosis not present

## 2023-04-16 DIAGNOSIS — M2041 Other hammer toe(s) (acquired), right foot: Secondary | ICD-10-CM | POA: Diagnosis not present

## 2023-04-16 NOTE — Progress Notes (Signed)
Subjective:   Patient ID: Kristen Jacobs, female   DOB: 77 y.o.   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

## 2023-04-17 ENCOUNTER — Encounter: Payer: Self-pay | Admitting: Podiatry

## 2023-04-26 ENCOUNTER — Other Ambulatory Visit: Payer: Self-pay | Admitting: Physician Assistant

## 2023-04-27 NOTE — Telephone Encounter (Signed)
Last Fill: 12/23/2022  Eye exam: 09/09/2022 WNL    Labs: 12/02/2022 CBC WNL.Creatinine is borderline elevated-1.03 and GFR is slightly low-56.Total protein remains low, albumin is low.   Next Visit: 06/05/2023  Last Visit: 12/02/2022  PJ:[id number] arthritis involving multiple sites with positive rheumatoid factor   Current Dose per office note 12/02/2022: Plaquenil 200 mg 1 tablet by mouth twice daily Monday to Friday   Patient advised she is due to update her lab work. Patient states she will come in on Friday to update.   Okay to refill Plaquenil?

## 2023-05-01 ENCOUNTER — Other Ambulatory Visit: Payer: Self-pay | Admitting: *Deleted

## 2023-05-01 DIAGNOSIS — Z79899 Other long term (current) drug therapy: Secondary | ICD-10-CM

## 2023-05-02 ENCOUNTER — Encounter: Payer: Self-pay | Admitting: Rheumatology

## 2023-05-02 LAB — COMPLETE METABOLIC PANEL WITH GFR
AG Ratio: 1.7 (calc) (ref 1.0–2.5)
ALT: 15 U/L (ref 6–29)
AST: 19 U/L (ref 10–35)
Albumin: 3.9 g/dL (ref 3.6–5.1)
Alkaline phosphatase (APISO): 64 U/L (ref 37–153)
BUN: 17 mg/dL (ref 7–25)
CO2: 24 mmol/L (ref 20–32)
Calcium: 9.6 mg/dL (ref 8.6–10.4)
Chloride: 109 mmol/L (ref 98–110)
Creat: 0.99 mg/dL (ref 0.60–1.00)
Globulin: 2.3 g/dL (ref 1.9–3.7)
Glucose, Bld: 76 mg/dL (ref 65–99)
Potassium: 4.8 mmol/L (ref 3.5–5.3)
Sodium: 141 mmol/L (ref 135–146)
Total Bilirubin: 0.4 mg/dL (ref 0.2–1.2)
Total Protein: 6.2 g/dL (ref 6.1–8.1)
eGFR: 58 mL/min/{1.73_m2} — ABNORMAL LOW (ref >=60)

## 2023-05-02 LAB — CBC WITH DIFFERENTIAL/PLATELET
Absolute Lymphocytes: 1269 {cells}/uL (ref 850–3900)
Absolute Monocytes: 451 {cells}/uL (ref 200–950)
Basophils Absolute: 49 {cells}/uL (ref 0–200)
Basophils Relative: 0.8 %
Eosinophils Absolute: 189 {cells}/uL (ref 15–500)
Eosinophils Relative: 3.1 %
HCT: 43.6 % (ref 35.0–45.0)
Hemoglobin: 14.1 g/dL (ref 11.7–15.5)
MCH: 30.3 pg (ref 27.0–33.0)
MCHC: 32.3 g/dL (ref 32.0–36.0)
MCV: 93.8 fL (ref 80.0–100.0)
MPV: 11.8 fL (ref 7.5–12.5)
Monocytes Relative: 7.4 %
Neutro Abs: 4142 {cells}/uL (ref 1500–7800)
Neutrophils Relative %: 67.9 %
Platelets: 252 10*3/uL (ref 140–400)
RBC: 4.65 10*6/uL (ref 3.80–5.10)
RDW: 11.3 % (ref 11.0–15.0)
Total Lymphocyte: 20.8 %
WBC: 6.1 10*3/uL (ref 3.8–10.8)

## 2023-05-04 NOTE — Progress Notes (Signed)
CBC is normal.  GFR is low and stable.

## 2023-05-05 ENCOUNTER — Encounter: Payer: Self-pay | Admitting: Sports Medicine

## 2023-05-05 DIAGNOSIS — M4722 Other spondylosis with radiculopathy, cervical region: Secondary | ICD-10-CM

## 2023-05-05 DIAGNOSIS — M5412 Radiculopathy, cervical region: Secondary | ICD-10-CM

## 2023-05-07 MED ORDER — TRAMADOL HCL 50 MG PO TABS: 50.0000 mg | ORAL_TABLET | Freq: Two times a day (BID) | ORAL | 3 refills | Status: DC | PRN

## 2023-05-07 MED ORDER — METHOCARBAMOL 500 MG PO TABS: 500.0000 mg | ORAL_TABLET | Freq: Three times a day (TID) | ORAL | 3 refills | Status: DC

## 2023-05-11 ENCOUNTER — Encounter: Payer: Medicare Other | Admitting: Student

## 2023-05-13 ENCOUNTER — Other Ambulatory Visit (HOSPITAL_COMMUNITY)
Admission: RE | Admit: 2023-05-13 | Discharge: 2023-05-13 | Disposition: A | Discharge status: Home / Self Care | Payer: Self-pay | Source: Ambulatory Visit | Attending: Oncology | Admitting: Oncology

## 2023-05-13 DIAGNOSIS — Z006 Encounter for examination for normal comparison and control in clinical research program: Secondary | ICD-10-CM | POA: Insufficient documentation

## 2023-05-14 ENCOUNTER — Ambulatory Visit: Payer: Medicare Other | Admitting: Rheumatology

## 2023-05-22 NOTE — Progress Notes (Signed)
Office Visit Note  Patient: Kristen Jacobs             Date of Birth: 01/14/1946           MRN: [id number]             PCP: Miguel Aschoff, MD Referring: Miguel Aschoff, MD Visit Date: 06/05/2023 Occupation: @GUAROCC @  Subjective:  Medication management and discuss DEXA scan results.  History of Present Illness: Kristen Jacobs is a 78 y.o. female with seropositive rheumatoid arthritis and osteoarthritis.  She states she has been doing well as regards to joint pain or joint swelling.  She has had no flares of rheumatoid arthritis.  She has been taking hydroxychloroquine 200 mg twice a day Monday to Friday without any interruption.  She denies any side effects from hydroxychloroquine.  She had a DEXA scan this morning and she wants to discuss results.  She has known history of osteoporosis.  She had menarche at age 88 and overactivity in 7s.  There is no history of stress fracture.  She had previous fracture from an injury.  She denies use of thyroid medications, antiepileptics, antacids and steroids.  There is no family history of osteoporosis or femur fracture.  She never had radiation therapy.  She takes Pilates twice a week.    Activities of Daily Living:  Patient reports morning stiffness for 0 minutes.   Patient Denies nocturnal pain.  Difficulty dressing/grooming: Denies Difficulty climbing stairs: Denies Difficulty getting out of chair: Denies Difficulty using hands for taps, buttons, cutlery, and/or writing: Reports  Review of Systems  Constitutional:  Positive for fatigue.  HENT:  Positive for mouth dryness. Negative for mouth sores.   Eyes:  Negative for dryness.  Respiratory:  Negative for shortness of breath.   Cardiovascular:  Negative for chest pain and palpitations.  Gastrointestinal:  Negative for blood in stool, constipation and diarrhea.  Endocrine: Negative for increased urination.  Genitourinary:  Negative for involuntary urination.  Musculoskeletal:   Positive for myalgias, muscle weakness, muscle tenderness and myalgias. Negative for joint pain, gait problem, joint pain, joint swelling and morning stiffness.  Skin:  Negative for color change, rash, hair loss and sensitivity to sunlight.  Allergic/Immunologic: Negative for susceptible to infections.  Neurological:  Negative for dizziness and headaches.  Hematological:  Negative for swollen glands.  Psychiatric/Behavioral:  Negative for depressed mood and sleep disturbance. The patient is not nervous/anxious.     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   Insomnia disorder 10/31/2021   Fear of vaccinations 02/22/2021   History of malignant neoplasm of skin 11/22/2020   Age-related osteoporosis without fracture 11/22/2020   Major depression in remission (HCC) 11/22/2020   History of hepatitis C 04/05/2020   Cervical spondylosis with radiculopathy 02/21/2020   Rheumatoid factor positive 02/17/2020   Laryngopharyngeal reflux (LPR) 12/20/2019   Primary osteoarthritis of left knee 07/19/2019   Subacromial bursitis of left shoulder joint 07/19/2019   Bilateral wrist pain, 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 05/24/2019   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  Urine POC  dipstick normal today.    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  Moved to [facility] in late 2010's.  Enjoys healthful living, Mediterranean diet, staying active.  First husband passed away due to complications from diabetes.  She has since remarried.   Immunization History  Administered Date(s) Administered   [employer] Vaccination 11/17/2019, 12/05/2019, 01/02/2020     Objective: Vital Signs: BP 101/63 (BP Location: Left Arm, Patient Position: Sitting, Cuff Size: Normal)   Pulse (!) 58   Resp 14   Ht 5' 5.5" (1.664 m)   Wt 142 lb (64.4 kg)   BMI 23.27 kg/m    Physical Exam Vitals and nursing note reviewed.  Constitutional:      Appearance: She is well-developed.  HENT:     Head: Normocephalic and atraumatic.  Eyes:     Conjunctiva/sclera: Conjunctivae normal.  Cardiovascular:     Rate and Rhythm: Normal rate and regular rhythm.     Heart sounds: Normal heart sounds.  Pulmonary:     Effort: Pulmonary effort is normal.     Breath sounds: Normal breath sounds.  Abdominal:     General: Bowel sounds are normal.     Palpations: Abdomen is soft.  Musculoskeletal:     Cervical back: Normal range of motion.  Lymphadenopathy:     Cervical: No cervical adenopathy.  Skin:    General: Skin is warm and dry.     Capillary Refill: Capillary refill takes less than 2 seconds.  Neurological:     Mental Status: She is  alert and oriented to person, place, and time.  Psychiatric:        Behavior: Behavior normal.      Musculoskeletal Exam: Cervical, thoracic and lumbar spine were in good range of motion.  Shoulder joints, elbow joints, wrist joints, MCPs PIPs and DIPs with good range of motion with no synovitis.  Hip joints, knee joints, ankles, MTPs and PIPs were in good range of motion with no synovitis.  CDAI Exam: CDAI Score: -- Patient Global: 0 / 100; Provider Global: 0 / 100 Swollen: --; Tender: -- Joint Exam 06/05/2023   No joint exam has been documented for this visit   There is currently no information documented on the homunculus. Go to the Rheumatology activity and complete the homunculus joint exam.  Investigation: No additional findings.  Imaging: DG BONE DENSITY (DXA) Result Date:  06/05/2023 EXAM: DUAL X-RAY ABSORPTIOMETRY (DXA) FOR BONE MINERAL DENSITY IMPRESSION: Referring Physician:  Miguel Aschoff Your patient completed a bone mineral density test using GE Lunar iDXA system (analysis version: 16). Technologist: BEC PATIENT: Name: Madge, Therrien Patient ID: [id number] Birth Date: Aug 20, 1945 Height: 65.5 in. Sex: Female Measured: 06/05/2023 Weight: 138.4 lbs. Indications: Advanced Age, Caucasian, Celexa, Depression, Desyrel, Estrogen Deficient, Height Loss (781.91), One ovary removed, Plaquenil, Postmenopausal, Rheumatoid Arthritis (714.0) Fractures: NONE Treatments: Bioidentical Hormones, Vitamin D (E933.5) ASSESSMENT: The BMD measured at Femur Total Right is 0.648 g/cm2 with a T-score of -2.9. This patient is considered osteoporotic according to World Health Organization [facility]) criteria. L2 & L3 were excluded due to degenerative changes. The quality of the exam is good. Site Region Measured Date Measured Age YA BMD Significant CHANGE T-score DualFemur Total Right 06/05/2023 78.6 -2.9 0.648 g/cm2 AP Spine L1-L4 (L2,L3) 06/05/2023 78.6 -2.2 0.902 g/cm2 DualFemur Total Mean 06/05/2023 78.6  -2.7 0.664 g/cm2 World Health Organization [facility]) criteria for post-menopausal, Caucasian Women: Normal       T-score at or above -1 SD Osteopenia   T-score between -1 and -2.5 SD Osteoporosis T-score at or below -2.5 SD RECOMMENDATION: 1. All patients should optimize calcium and vitamin D intake. 2. Consider FDA-approved medical therapies in postmenopausal women and men aged 99 years and older, based on the following: a. A hip or vertebral (clinical or morphometric) fracture. b. T-score = -2.5 at the femoral neck or spine after appropriate evaluation to exclude secondary causes. c. Low bone mass (T-score between -1.0 and -2.5 at the femoral neck or spine) and a 10-year probability of a hip fracture = 3% or a 10-year probability of a major osteoporosis-related fracture = 20% based on the [place]-adapted WHO algorithm. d. Clinician judgment and/or patient preferences may indicate treatment for people with 10-year fracture probabilities above or below these levels. FOLLOW-UP: Patients with diagnosis of osteoporosis or at high risk for fracture should have regular bone mineral density tests.? Patients eligible for Medicare are allowed routine testing every 2 years.? The testing frequency can be increased to one year for patients who have rapidly progressing disease, are receiving or discontinuing medical therapy to restore bone mass, or have additional risk factors. I have reviewed this study and agree with the findings. [facility] Electronically Signed   By: Romona Curls M.D.   On: 06/05/2023 09:53   ECHOCARDIOGRAM COMPLETE Result Date: 05/25/2023    ECHOCARDIOGRAM REPORT   Patient Name:   Karuna Balducci Date of Exam: 05/25/2023 Medical Rec #:  [id number]     Height:       65.5 in Accession #:    [id number]    Weight:       142.0 lb Date of Birth:  08/14/1945     BSA:          1.720 m Patient Age:    70 years      BP:           110/56 mmHg Patient Gender: F             HR:           56 bpm. Exam Location:   [address] Procedure: 2D Echo, 3D Echo, Cardiac Doppler, Color Doppler and Strain Analysis Indications:    I71 Ascedending Aortic Aneurysm  History:        Patient has no prior history of Echocardiogram examinations.  Signs/Symptoms:Hypotension; Risk Factors:Dyslipidemia and Family                 History of Coronary Artery Disease. Eval for Bicuspid Aortic                 Valve (Family History) and Aortic Aneurysm.  Sonographer:    Farrel Conners RDCS Referring Phys: Orbie Pyo IMPRESSIONS  1. Left ventricular ejection fraction, by estimation, is 55 to 60%. The left ventricle has normal function. The left ventricle has no regional wall motion abnormalities. Left ventricular diastolic parameters are consistent with Grade I diastolic dysfunction (impaired relaxation). The average left ventricular global longitudinal strain is -22.7 %. The global longitudinal strain is normal.  2. Right ventricular systolic function is normal. The right ventricular size is normal.  3. Left atrial size was mildly dilated.  4. The mitral valve is normal in structure. Mild mitral valve regurgitation. No evidence of mitral stenosis.  5. The aortic valve is tricuspid. There is mild calcification of the aortic valve. Aortic valve regurgitation is moderate. No aortic stenosis is present. Aortic regurgitation PHT measures 536 msec.  6. Aortic dilatation noted. There is mild dilatation of the ascending aorta, measuring 41 mm.  7. The inferior vena cava is normal in size with greater than 50% respiratory variability, suggesting right atrial pressure of 3 mmHg. FINDINGS  Left Ventricle: Left ventricular ejection fraction, by estimation, is 55 to 60%. The left ventricle has normal function. The left ventricle has no regional wall motion abnormalities. The average left ventricular global longitudinal strain is -22.7 %. The global longitudinal strain is normal. The left ventricular internal cavity size was normal in size.  There is no left ventricular hypertrophy. Left ventricular diastolic parameters are consistent with Grade I diastolic dysfunction (impaired relaxation). Right Ventricle: The right ventricular size is normal. No increase in right ventricular wall thickness. Right ventricular systolic function is normal. Left Atrium: Left atrial size was mildly dilated. Right Atrium: Right atrial size was normal in size. Pericardium: There is no evidence of pericardial effusion. Mitral Valve: The mitral valve is normal in structure. Mild mitral valve regurgitation. No evidence of mitral valve stenosis. Tricuspid Valve: The tricuspid valve is normal in structure. Tricuspid valve regurgitation is mild . No evidence of tricuspid stenosis. Aortic Valve: The aortic valve is tricuspid. There is mild calcification of the aortic valve. Aortic valve regurgitation is moderate. Aortic regurgitation PHT measures 536 msec. No aortic stenosis is present. Pulmonic Valve: The pulmonic valve was normal in structure. Pulmonic valve regurgitation is mild. No evidence of pulmonic stenosis. Aorta: Aortic dilatation noted. There is mild dilatation of the ascending aorta, measuring 41 mm. Venous: The inferior vena cava is normal in size with greater than 50% respiratory variability, suggesting right atrial pressure of 3 mmHg. IAS/Shunts: No atrial level shunt detected by color flow Doppler.  LEFT VENTRICLE PLAX 2D LVIDd:         4.50 cm   Diastology LVIDs:         2.20 cm   LV e' medial:    10.20 cm/s LV PW:         0.60 cm   LV E/e' medial:  6.6 LV IVS:        0.60 cm   LV e' lateral:   14.30 cm/s LVOT diam:     2.10 cm   LV E/e' lateral: 4.7 LV SV:         83 LV SV Index:   48  2D Longitudinal Strain LVOT Area:     3.46 cm  2D Strain GLS (A2C):   -25.0 %                          2D Strain GLS (A3C):   -23.8 %                          2D Strain GLS (A4C):   -19.4 %                          2D Strain GLS Avg:     -22.7 %                           3D  Volume EF:                          3D EF:        71 %                          LV EDV:       113 ml                          LV ESV:       33 ml                          LV SV:        80 ml RIGHT VENTRICLE             IVC RV Basal diam:  4.50 cm     IVC diam: 1.40 cm RV Mid diam:    3.20 cm RV S prime:     13.50 cm/s TAPSE (M-mode): 2.8 cm RVSP:           14.2 mmHg LEFT ATRIUM             Index        RIGHT ATRIUM           Index LA diam:        3.70 cm 2.15 cm/m   RA Pressure: 3.00 mmHg LA Vol (A2C):   55.1 ml 32.04 ml/m  RA Area:     18.10 cm LA Vol (A4C):   63.7 ml 37.04 ml/m  RA Volume:   55.80 ml  32.44 ml/m LA Biplane Vol: 64.7 ml 37.62 ml/m  AORTIC VALVE LVOT Vmax:   103.80 cm/s LVOT Vmean:  68.150 cm/s LVOT VTI:    0.240 m AI PHT:      536 msec  AORTA Ao Asc diam: 3.90 cm MITRAL VALVE               TRICUSPID VALVE MV Area (PHT)  cm         TR Peak grad:   11.2 mmHg MV Decel Time: 240 msec    TR Vmax:        167.00 cm/s MR Peak grad: 95.6 mmHg    Estimated RAP:  3.00 mmHg MR Mean grad: 61.0 mmHg    RVSP:           14.2 mmHg MR Vmax:      489.00 cm/s MR Vmean:     363.0 cm/s  SHUNTS MV E velocity: 66.85 cm/s  Systemic VTI:  0.24 m MV A velocity: 89.60 cm/s  Systemic Diam: 2.10 cm MV E/A ratio:  0.75 Arvilla Meres MD Electronically signed by Arvilla Meres MD Signature Date/Time: 05/25/2023/2:52:49 PM    Final     Recent Labs: Lab Results  Component Value Date   WBC 6.1 05/01/2023   HGB 14.1 05/01/2023   PLT 252 05/01/2023   NA 141 05/01/2023   K 4.8 05/01/2023   CL 109 05/01/2023   CO2 24 05/01/2023   GLUCOSE 76 05/01/2023   BUN 17 05/01/2023   CREATININE 0.99 05/01/2023   BILITOT 0.4 05/01/2023   ALKPHOS 87 01/24/2021   AST 19 05/01/2023   ALT 15 05/01/2023   PROT 6.2 05/01/2023   ALBUMIN 4.2 01/24/2021   CALCIUM 9.6 05/01/2023   GFRAA 67 10/25/2020    Speciality Comments: PLQ eye exam: 09/09/2022 [facility] f/u 12 months  Procedures:  No procedures  performed Allergies: Other and Sulfa antibiotics   Assessment / Plan:     Visit Diagnoses: Rheumatoid arthritis involving multiple sites with positive rheumatoid factor (HCC) - MRI of the wrist joint showed synovitis, tenosynovitis and possible erosions: Patient states that she has not had any flares.  She denies any joint pain or joint swelling.  No synovitis was noted on the examination.  She is on hydroxychloroquine 200 mg p.o. twice daily Monday to Friday which she is tolerating well.  High risk medication use - Plaquenil 200 mg 1 tablet by mouth twice daily Monday to Friday.PLQ eye exam: 09/09/2022 [facility] -recent labs showed mildly elevated creatinine and low GFR.  Patient wants lab repeated.  Plan: COMPLETE METABOLIC PANEL WITH GFR.  She was advised to have repeat labs in 5 months.  Information for immunization was placed in the AVS.  Pain in both hands-she had no synovitis on the examination.  Primary osteoarthritis of left knee-she denied any discomfort today.  She good range of motion of her left knee joint.  Primary osteoarthritis of both feet-no joint welling was noted.  DDD (degenerative disc disease), cervical - Dr. Lovell Sheehan.  She had good range of motion of the cervical spine.  Closed nondisplaced avulsion fracture of left talus with routine healing, subsequent encounter-after injury.  Age-related osteoporosis without fracture - June 04, 2022 DEXA scan: The BMD measured at Femur Total Right is 0.648 g/cm2 with a T-score of -2.9 -I did detailed discussion with the patient regarding osteoporosis.  Different treatment options and their side effects were discussed at length.  Both antiresorptive agents and anabolic agents were discussed.  After reviewing indications side effects contraindications patient wanted to proceed with Fosamax.  Although she would like to read more about it before she will start the medication.  She will contact us to start on Fosamax.  Her  dose will be Fosamax 70 mg p.o. weekly.  She was advised to take Fosamax with 10 ounces of water once a week.  Side effects including increased risk of osteonecrosis of jaw and atypical fracture was also discussed at length.  Patient states that she goes for dental regular dental clinic and does not require any dental work.  I will obtain following labs.  Plan: Parathyroid hormone, intact (no Ca), VITAMIN D 25 Hydroxy (Vit-D Deficiency, Fractures), Serum protein electrophoresis with reflex, TSH, Phosphorus  Vitamin D deficiency -patient has history of vitamin D.  Plan: VITAMIN D 25 Hydroxy (Vit-D Deficiency, Fractures)  History of hepatitis C  Primary  insomnia - trazodone 100 mg is helpful.  Other fatigue  Anxiety  Orders: Orders Placed This Encounter  Procedures   Parathyroid hormone, intact (no Ca)   VITAMIN D 25 Hydroxy (Vit-D Deficiency, Fractures)   Serum protein electrophoresis with reflex   TSH   Phosphorus   COMPLETE METABOLIC PANEL WITH GFR   No orders of the defined types were placed in this encounter.   Face-to-face time spent with patient was 40 minutes. Greater than 50% of time was spent in counseling and coordination of care.  Follow-Up Instructions: Return in about 3 months (around 09/02/2023) for Osteoarthritis, Rheumatoid arthritis, Osteoporosis.   Pollyann Savoy, MD  Note - This record has been created using Animal nutritionist.  Chart creation errors have been sought, but may not always  have been located. Such creation errors do not reflect on  the standard of medical care.

## 2023-05-25 ENCOUNTER — Ambulatory Visit (HOSPITAL_COMMUNITY): Payer: Medicare Other | Attending: Internal Medicine

## 2023-05-25 ENCOUNTER — Encounter: Payer: Self-pay | Admitting: Internal Medicine

## 2023-05-25 DIAGNOSIS — I7121 Aneurysm of the ascending aorta, without rupture: Secondary | ICD-10-CM | POA: Diagnosis present

## 2023-05-25 LAB — GENECONNECT MOLECULAR SCREEN: Genetic Analysis Overall Interpretation: NEGATIVE

## 2023-05-25 LAB — ECHOCARDIOGRAM COMPLETE
Area-P 1/2: 3.17 cm2
MV M vel: 4.89 m/s
MV Peak grad: 95.6 mm[Hg]
P 1/2 time: 536 ms
S' Lateral: 2.2 cm

## 2023-06-05 ENCOUNTER — Ambulatory Visit
Admission: RE | Admit: 2023-06-05 | Discharge: 2023-06-05 | Disposition: A | Discharge status: Home / Self Care | Payer: Medicare Other | Source: Ambulatory Visit | Attending: Internal Medicine | Admitting: Internal Medicine

## 2023-06-05 ENCOUNTER — Ambulatory Visit: Payer: Medicare Other | Attending: Rheumatology | Admitting: Rheumatology

## 2023-06-05 ENCOUNTER — Encounter: Payer: Self-pay | Admitting: Rheumatology

## 2023-06-05 VITALS — BP 101/63 | HR 58 | Resp 14 | Ht 65.5 in | Wt 142.0 lb

## 2023-06-05 DIAGNOSIS — Z8619 Personal history of other infectious and parasitic diseases: Secondary | ICD-10-CM

## 2023-06-05 DIAGNOSIS — M79642 Pain in left hand: Secondary | ICD-10-CM

## 2023-06-05 DIAGNOSIS — Z79899 Other long term (current) drug therapy: Secondary | ICD-10-CM | POA: Diagnosis not present

## 2023-06-05 DIAGNOSIS — M818 Other osteoporosis without current pathological fracture: Secondary | ICD-10-CM

## 2023-06-05 DIAGNOSIS — M1712 Unilateral primary osteoarthritis, left knee: Secondary | ICD-10-CM | POA: Diagnosis not present

## 2023-06-05 DIAGNOSIS — M5030 Other cervical disc degeneration, unspecified cervical region: Secondary | ICD-10-CM

## 2023-06-05 DIAGNOSIS — M19071 Primary osteoarthritis, right ankle and foot: Secondary | ICD-10-CM

## 2023-06-05 DIAGNOSIS — M79641 Pain in right hand: Secondary | ICD-10-CM

## 2023-06-05 DIAGNOSIS — F5101 Primary insomnia: Secondary | ICD-10-CM

## 2023-06-05 DIAGNOSIS — M19072 Primary osteoarthritis, left ankle and foot: Secondary | ICD-10-CM

## 2023-06-05 DIAGNOSIS — E559 Vitamin D deficiency, unspecified: Secondary | ICD-10-CM

## 2023-06-05 DIAGNOSIS — M7712 Lateral epicondylitis, left elbow: Secondary | ICD-10-CM

## 2023-06-05 DIAGNOSIS — R5383 Other fatigue: Secondary | ICD-10-CM

## 2023-06-05 DIAGNOSIS — M0579 Rheumatoid arthritis with rheumatoid factor of multiple sites without organ or systems involvement: Secondary | ICD-10-CM | POA: Diagnosis not present

## 2023-06-05 DIAGNOSIS — F419 Anxiety disorder, unspecified: Secondary | ICD-10-CM

## 2023-06-05 DIAGNOSIS — S92155D Nondisplaced avulsion fracture (chip fracture) of left talus, subsequent encounter for fracture with routine healing: Secondary | ICD-10-CM

## 2023-06-05 NOTE — Patient Instructions (Signed)
Standing Labs We placed an order today for your standing lab work.   Please have your standing labs drawn in May  Please have your labs drawn 2 weeks prior to your appointment so that the provider can discuss your lab results at your appointment, if possible.  Please note that you may see your imaging and lab results in MyChart before we have reviewed them. We will contact you once all results are reviewed. Please allow our office up to 72 hours to thoroughly review all of the results before contacting the office for clarification of your results.  WALK-IN LAB HOURS  Monday through Thursday from 8:00 am -12:30 pm and 1:00 pm-5:00 pm and Friday from 8:00 am-12:00 pm.  Patients with office visits requiring labs will be seen before walk-in labs.  You may encounter longer than normal wait times. Please allow additional time. Wait times may be shorter on  Monday and Thursday afternoons.  We do not book appointments for walk-in labs. We appreciate your patience and understanding with our staff.   Labs are drawn by [facility]. Please bring your co-pay at the time of your lab draw.  You may receive a bill from [facility] for your lab work.  Please note if you are on Hydroxychloroquine and and an order has been placed for a Hydroxychloroquine level,  you will need to have it drawn 4 hours or more after your last dose.  If you wish to have your labs drawn at another location, please call the office 24 hours in advance so we can fax the orders.  The office is located at [address], [address], [place], [place] [address]   If you have any questions regarding directions or hours of operation,  please call [phone].   As a reminder, please drink plenty of water prior to coming for your lab work. Thanks!   Vaccines You are taking a medication(s) that can suppress your immune system.  The following immunizations are recommended: Flu annually Covid-19  RSV Td/Tdap (tetanus, diphtheria, pertussis)  every 10 years Pneumonia (Prevnar 15 then Pneumovax 23 at least 1 year apart.  Alternatively, can take Prevnar 20 without needing additional dose) Shingrix: 2 doses from 4 weeks to 6 months apart  Please check with your PCP to make sure you are up to date.   Preventing Osteoporosis, Adult Osteoporosis is a condition that causes the bones to lose density. This means that the bones become thinner, and the normal spaces in bone tissue become larger. Low bone density can make the bones weak and cause them to break more easily. Osteoporosis cannot always be prevented, but you can take steps to lower your risk of developing this condition. How can this condition affect me? If you develop osteoporosis, you will be more likely to break bones in your wrist, spine, or hip. Even a minor accident or injury can be enough to break weak bones. The bones will also be slower to heal. Osteoporosis can cause other problems as well, such as a stooped posture or trouble with movement. Osteoporosis can occur with aging. As you get older, you may lose bone tissue more quickly, or it may be replaced more slowly. Osteoporosis is more likely to develop if you have poor nutrition or do not get enough calcium or vitamin D. Other lifestyle factors can also play a role. By eating a well-balanced diet and making lifestyle changes, you can help keep your bones strong and healthy, lowering your chances of developing osteoporosis. What can increase  my risk? The following factors may make you more likely to develop osteoporosis: Having a family history of the condition. Having poor nutrition or not getting enough calcium or vitamin D. Using certain medicines, such as steroid medicines or anti-seizure medicines. Being any of the following: 23 years of age or older. Female. A woman who has gone through menopause (is postmenopausal). A person who is of European or Asian descent. Using products that contain nicotine or tobacco, such  as cigarettes, e-cigarettes, and chewing tobacco. Not being physically active (being sedentary). Having a small body frame. What actions can I take to prevent this? Get enough calcium  Make sure you get enough calcium every day. Calcium is the most important mineral for bone health. Most people can get enough calcium from their diet, but supplements may be recommended for people who are at risk for osteoporosis. Follow these guidelines: If you are age 77 or younger, aim to get 1,000 milligrams (mg) of calcium every day. If you are older than age 28, aim to get 1,200 mg of calcium every day. Good sources of calcium include: Dairy products, such as low-fat or nonfat milk, cheese, and yogurt. Dark green leafy vegetables, such as bok choy and broccoli. Foods that have had calcium added to them (calcium-fortified foods), such as orange juice, cereal, bread, soy beverages, and tofu products. Nuts, such as almonds. Check nutrition labels to see how much calcium is in a food or drink. Get enough vitamin D Try to get enough vitamin D every day. Vitamin D is the most essential vitamin for bone health. It helps the body absorb calcium. Follow these guidelines for how much vitamin D to get from food: If you are age 23 or younger, aim to get at least 600 international units (IU) every day. Your health care provider may suggest more. If you are older than age 36, aim to get at least 800 international units every day. Your health care provider may suggest more. Good sources of vitamin D in your diet include: Egg yolks. Oily fish, such as salmon, sardines, and tuna. Milk and cereal fortified with vitamin D. Your body also makes vitamin D when you are out in the sun. Exposing the bare skin on your face, arms, legs, or back to the sun for no more than 30 minutes a day, 2 times a week is more than enough. Beyond that, make sure you use sunblock to protect your skin from sunburn, which increases your risk for  skin cancer. Exercise  Stay active and get exercise every day. Ask your health care provider what types of exercise are best for you. Weight-bearing and strength-building activities are important for building and maintaining healthy bones. Some examples of these types of activities include: Walking and hiking. Jogging and running. Dancing. Gym exercises and lifting weights. Tennis and racquetball. Climbing stairs. Tai chi. Make other lifestyle changes Do not use any products that contain nicotine or tobacco, such as cigarettes, e-cigarettes, and chewing tobacco. If you need help quitting, ask your health care provider. Lose weight if you are overweight. If you drink alcohol: Limit how much you use to: 0-1 drink a day for women who are not pregnant. 0-2 drinks a day for men. Be aware of how much alcohol is in your drink. In the [place] one drink equals one 12 oz bottle of beer (355 mL), one 5 oz glass of wine (148 mL), or one 1 oz glass of hard liquor (44 mL). Where to find support If you  need help making changes to prevent osteoporosis, talk with your health care provider. You can ask for a referral to a dietitian and a physical therapist. Where to find more information Learn more about osteoporosis from: NIH Osteoporosis and Related Bone Diseases National Resource Center: www.bones.http://www.myers.net/ [place] Office on [employer] Health: http://hoffman.com/ National Osteoporosis Foundation: [employer] Summary Osteoporosis is a condition that causes weak bones that are more likely to break. Eat a healthy diet, making sure you get enough calcium and vitamin D, and stay active by getting regular exercise to help prevent osteoporosis. Other ways to reduce your risk of osteoporosis include maintaining a healthy weight and avoiding alcohol and products that contain nicotine or tobacco. This information is not intended to replace advice given to you by your health care provider. Make sure you discuss any  questions you have with your health care provider. Document Revised: 12/23/2022 Document Reviewed: 12/23/2022 Elsevier Patient Education  2024 Elsevier Inc.  Alendronate Tablets What is this medication? ALENDRONATE (a LEN droe nate) prevents and treats osteoporosis. It may also be used to treat Paget disease of the bone. It works by Interior and spatial designer stronger and less likely to break (fracture). It belongs to a group of medications called bisphosphonates. This medicine may be used for other purposes; ask your health care provider or pharmacist if you have questions. COMMON BRAND NAME(S): Fosamax What should I tell my care team before I take this medication? They need to know if you have any of these conditions: Bleeding disorder Cancer Dental disease Difficulty swallowing Infection (fever, chills, cough, sore throat, pain or trouble passing urine) Kidney disease Low levels of calcium or other minerals in the blood Low red blood cell counts Receiving steroids like dexamethasone or prednisone Stomach or intestine problems Trouble sitting or standing for 30 minutes An unusual or allergic reaction to alendronate, other medications, foods, dyes or preservatives Pregnant or trying to get pregnant Breast-feeding How should I use this medication? Take this medication by mouth with a full glass of water. Take it as directed on the prescription label at the same time every day. Take the dose right after waking up. Do not eat or drink anything before taking it. Do not take it with any other drink except water. Do not chew or crush the tablet. After taking it, do not eat breakfast, drink, or take any other medications or vitamins for at least 30 minutes. Sit or stand up for at least 30 minutes after you take it. Do not lie down. Keep taking it unless your care team tells you to stop. A special MedGuide will be given to you by the pharmacist with each prescription and refill. Be sure to read this  information carefully each time. Talk to your care team about the use of this medication in children. Special care may be needed. Overdosage: If you think you have taken too much of this medicine contact a poison control center or emergency room at once. NOTE: This medicine is only for you. Do not share this medicine with others. What if I miss a dose? If you take your medication once a day, skip it. Take your next dose at the scheduled time the next morning. Do not take two doses on the same day. If you take your medication once a week, take the missed dose on the morning after you remember. Do not take two doses on the same day. What may interact with this medication? Aluminum hydroxide Antacids Aspirin Calcium supplements Medications for inflammation like  ibuprofen, naproxen, and others Iron supplements Magnesium supplements Vitamins with minerals This list may not describe all possible interactions. Give your health care provider a list of all the medicines, herbs, non-prescription drugs, or dietary supplements you use. Also tell them if you smoke, drink alcohol, or use illegal drugs. Some items may interact with your medicine. What should I watch for while using this medication? Visit your care team for regular checks on your progress. It may be some time before you see the benefit from this medication. Some people who take this medication have severe bone, joint, or muscle pain. This medication may also increase your risk for jaw problems or a broken thigh bone. Tell your care team right away if you have severe pain in your jaw, bones, joints, or muscles. Tell you care team if you have any pain that does not go away or that gets worse. Tell your dentist and dental surgeon that you are taking this medication. You should not have major dental surgery while on this medication. See your dentist to have a dental exam and fix any dental problems before starting this medication. Take good care of  your teeth while on this medication. Make sure you see your dentist for regular follow-up appointments. You should make sure you get enough calcium and vitamin D while you are taking this medication. Discuss the foods you eat and the vitamins you take with your care team. You may need blood work done while you are taking this medication. What side effects may I notice from receiving this medication? Side effects that you should report to your care team as soon as possible: Allergic reactions--skin rash, itching, hives, swelling of the face, lips, tongue, or throat Low calcium level--muscle pain or cramps, confusion, tingling, or numbness in the hands or feet Osteonecrosis of the jaw--pain, swelling, or redness in the mouth, numbness of the jaw, poor healing after dental work, unusual discharge from the mouth, visible bones in the mouth Pain or trouble swallowing Severe bone, joint, or muscle pain Stomach bleeding--bloody or black, tar-like stools, vomiting blood or brown material that looks like coffee grounds Side effects that usually do not require medical attention (report to your care team if they continue or are bothersome): Constipation Diarrhea Nausea Stomach pain This list may not describe all possible side effects. Call your doctor for medical advice about side effects. You may report side effects to FDA at 1-800-FDA-1088. Where should I keep my medication? Keep out of the reach of children and pets. Store at room temperature between 15 and 30 degrees C (59 and 86 degrees F). Throw away any unused medication after the expiration date. NOTE: This sheet is a summary. It may not cover all possible information. If you have questions about this medicine, talk to your doctor, pharmacist, or health care provider.  2024 Elsevier/Gold Standard (2020-05-03 00:00:00)

## 2023-06-07 NOTE — Progress Notes (Signed)
Creatinine is mildly elevated.  Patient should avoid all NSAIDs and increase water intake.  TSH normal, PTH normal, vitamin D normal phosphorus normal.  SPEP pending.  Please forward results to her PCP.  Patient can discuss elevated creatinine with her PCP.

## 2023-06-10 ENCOUNTER — Other Ambulatory Visit: Payer: Self-pay | Admitting: *Deleted

## 2023-06-10 DIAGNOSIS — R899 Unspecified abnormal finding in specimens from other organs, systems and tissues: Secondary | ICD-10-CM

## 2023-06-10 LAB — PROTEIN ELECTROPHORESIS, SERUM, WITH REFLEX
Albumin ELP: 3.4 g/dL — ABNORMAL LOW (ref 3.8–4.8)
Alpha 1: 0.3 g/dL (ref 0.2–0.3)
Alpha 2: 0.8 g/dL (ref 0.5–0.9)
Beta 2: 0.4 g/dL (ref 0.2–0.5)
Beta Globulin: 0.4 g/dL (ref 0.4–0.6)
Gamma Globulin: 0.5 g/dL — ABNORMAL LOW (ref 0.8–1.7)
Total Protein: 5.7 g/dL — ABNORMAL LOW (ref 6.1–8.1)

## 2023-06-10 LAB — TSH: TSH: 1.72 m[IU]/L (ref 0.40–4.50)

## 2023-06-10 LAB — PHOSPHORUS: Phosphorus: 3.5 mg/dL (ref 2.1–4.3)

## 2023-06-10 LAB — COMPLETE METABOLIC PANEL WITH GFR
AG Ratio: 1.8 (calc) (ref 1.0–2.5)
ALT: 11 U/L (ref 6–29)
AST: 17 U/L (ref 10–35)
Albumin: 3.7 g/dL (ref 3.6–5.1)
Alkaline phosphatase (APISO): 52 U/L (ref 37–153)
BUN/Creatinine Ratio: 22 (calc) (ref 6–22)
BUN: 24 mg/dL (ref 7–25)
CO2: 23 mmol/L (ref 20–32)
Calcium: 9.6 mg/dL (ref 8.6–10.4)
Chloride: 105 mmol/L (ref 98–110)
Creat: 1.07 mg/dL — ABNORMAL HIGH (ref 0.60–1.00)
Globulin: 2.1 g/dL (ref 1.9–3.7)
Glucose, Bld: 87 mg/dL (ref 65–99)
Potassium: 4.6 mmol/L (ref 3.5–5.3)
Sodium: 139 mmol/L (ref 135–146)
Total Bilirubin: 0.4 mg/dL (ref 0.2–1.2)
Total Protein: 5.8 g/dL — ABNORMAL LOW (ref 6.1–8.1)
eGFR: 53 mL/min/{1.73_m2} — ABNORMAL LOW (ref >=60)

## 2023-06-10 LAB — IFE INTERPRETATION

## 2023-06-10 LAB — PARATHYROID HORMONE, INTACT (NO CA): PTH: 47 pg/mL (ref 16–77)

## 2023-06-10 LAB — VITAMIN D 25 HYDROXY (VIT D DEFICIENCY, FRACTURES): Vit D, 25-Hydroxy: 78 ng/mL (ref 30–100)

## 2023-06-22 ENCOUNTER — Other Ambulatory Visit: Payer: Self-pay | Admitting: Internal Medicine

## 2023-06-22 DIAGNOSIS — Z1231 Encounter for screening mammogram for malignant neoplasm of breast: Secondary | ICD-10-CM

## 2023-07-02 ENCOUNTER — Ambulatory Visit: Payer: Medicare Other | Admitting: Student

## 2023-07-02 ENCOUNTER — Encounter: Payer: Self-pay | Admitting: Student

## 2023-07-02 ENCOUNTER — Ambulatory Visit: Payer: Medicare Other

## 2023-07-02 VITALS — BP 100/58 | HR 72 | Ht 65.5 in | Wt 147.8 lb

## 2023-07-02 DIAGNOSIS — H6123 Impacted cerumen, bilateral: Secondary | ICD-10-CM

## 2023-07-02 DIAGNOSIS — I951 Orthostatic hypotension: Secondary | ICD-10-CM | POA: Insufficient documentation

## 2023-07-02 NOTE — Assessment & Plan Note (Signed)
 Kristen Jacobs is a 78 y.o. who comes in for evaluation of vertigo that started on Thursday last week after her pilates class. Had to call her husband to get her as she could not drive. States she has been ataxic and losing balance. Denies recent URI symptoms. Had two episodes of vertigo in the past the last one over 6 years ago, the first one 15 years ago. No recent changes in her medications except for a compound treatment she gets from her ObGyn containing estrogen, progesterone, testosterone and DHEAS but was started on Sunday after her symptoms develop. She is taking Trazodone nightly for over 15 years and takes tramadol every now and then. Head Thrust test negative on exam, no fevers or URIs within the last week. Tympanic membranes are intact, she does have wax but no signs of infection. Dix-Hallpike was positive for vertigo but no nystagmus. She may have a component of BPPV. Her symptoms are not constant and intermittent. Neurologically intact otherwise. Orthostatic vitals are positive and improved after water intake. Likely component of BPPV and Orthostatic hypotension.   -Vestibular rehab offered, but patient declined.  -Keep hydration  -Precautions when changing positions provided.  -Avoid tramadol use  -She is on trazodone which could be making her symptoms worse. Has been on this medication for many years, but if symptoms do not improve would consider decreasing dose. More prone to hypotension with aging.

## 2023-07-02 NOTE — Patient Instructions (Signed)
 Thank you, Kristen Jacobs for allowing [place] to provide your care today.  I have ordered the following medication/changed the following medications:   Follow up:  Please make sure to get hydrated and be cautious moving. You would benefit from vestibular rehab, please let us know if you would like to get it done and we can send a referral for you.   Should you have any questions or concerns please call the internal medicine clinic at [phone].     Manuela Neptune, MD [facility]

## 2023-07-02 NOTE — Assessment & Plan Note (Signed)
 Counseled patient that she can use OTC ear wax removal kits. Avoid use of Q tips. She could also schedule an appointment for [place] to get them cleaned. Pt deferred appointment in clinic. She does state she had hearing loss and saw an audiologist in the past . Her last audiology test was over 7 years ago. She has not noticed worsening hearing. Offered referral for audiology but patient declined as she states her hearing has not significantly worsened.

## 2023-07-02 NOTE — Progress Notes (Signed)
 CC:  Chief Complaint  Patient presents with   Dizziness    Started last Thurs. Was ok this week until end of exercise class today. Mouth dry. See orthostatic vitals below Lying 102/54 67 Sitting 104/53 70 Standing 84/52 79    HPI:  [name] is a 77 y.o. female living with a history stated below and presents today for evaluation of vertigo. Please see problem based assessment and plan for additional details.  Past Medical History:  Diagnosis Date   Avulsion fracture of left talus 09/05/2019   Bilateral foot pain    Constipation    Depression    Forgetfulness    Hepatitis    History of UTI 11/22/2020   Recent event, fatigue w/o dysuria, symptoms resolved with antibiotic (name not recalled).  Urine POC dipstick normal today.    Joint pain    Laryngopharyngeal reflux     Current Outpatient Medications on File Prior to Visit  Medication Sig Dispense Refill   Ascorbic Acid (VITAMIN C) 100 MG CHEW Chew by mouth.     aspirin EC 81 MG tablet Take 1 tablet (81 mg total) by mouth daily. Swallow whole.     atorvastatin (LIPITOR) 40 MG tablet Take 1 tablet (40 mg total) by mouth daily. 90 tablet 3   b complex vitamins capsule Take 1 capsule by mouth daily.     Cholecalciferol (VITAMIN D3 PO) Take 5,000 Units by mouth daily. With vitamin K     citalopram (CELEXA) 20 MG tablet Take 1 tablet (20 mg total) by mouth daily. 90 tablet 3   EPINEPHrine (EPIPEN 2-PAK) 0.3 mg/0.3 mL IJ SOAJ injection Inject 0.3 mg into the muscle as needed for anaphylaxis. 1 each 2   Glucosamine 500 MG CAPS Take 1 capsule by mouth. (Wild Shrimp Glucosamine)     hydroxychloroquine (PLAQUENIL) 200 MG tablet TAKE 1 TABLET BY MOUTH TWICE  DAILY MONDAY THROUGH FRIDAY  ONLY. NONE ON SATURDAY OR SUNDAY 120 tablet 0   ketoconazole (NIZORAL) 2 % shampoo Apply topically as needed.     methocarbamol (ROBAXIN) 500 MG tablet Take 1 tablet (500 mg total) by mouth 3 (three) times daily. (Muscle relaxer) (Patient taking  differently: Take 500 mg by mouth as needed. (Muscle relaxer)) 90 tablet 3   MISC NATURAL PRODUCTS PO Take 3 capsules by mouth daily. Bone Strength: Vitamin D3, Vitamin K1, Vitamin K2, Calcium, MAgnesium, Strontium, Vanadium     OVER THE COUNTER MEDICATION at bedtime. Triphala (anti inflammatory) (Patient not taking: Reported on 06/05/2023)     Prasterone, DHEA, (DHEA PO) Take by mouth daily.     traMADol (ULTRAM) 50 MG tablet Take 1 tablet (50 mg total) by mouth every 12 (twelve) hours as needed. 60 tablet 3   traZODone (DESYREL) 100 MG tablet Take 1 tablet (100 mg total) by mouth at bedtime. 90 tablet 3   UNABLE TO FIND Med Name: bio identials     Zinc 10 MG LOZG Use as directed in the mouth or throat.     No current facility-administered medications on file prior to visit.    Family History  Problem Relation Age of Onset   Dementia Mother    Cancer - Lung Father    Bipolar disorder Sister    Leukemia Brother    Healthy Son    Asthma Son    Healthy Son    Breast cancer Neg Hx     Social History   Socioeconomic History   Marital status: Married  Spouse name: Not on file   Number of children: Not on file   Years of education: Not on file   Highest education level: Not on file  Occupational History   Not on file  Tobacco Use   Smoking status: Former    Current packs/day: 0.00    Average packs/day: 3.0 packs/day for 26.0 years (78.0 ttl pk-yrs)    Types: Cigarettes    Start date: 50    Quit date: 47    Years since quitting: 35.1    Passive exposure: Current (Husband smokes cigars)   Smokeless tobacco: Never  Vaping Use   Vaping status: Never Used  Substance and Sexual Activity   Alcohol use: Not Currently    Comment: quit 1990   Drug use: Not Currently    Types: Marijuana   Sexual activity: Not Currently  Other Topics Concern   Not on file  Social History Narrative   From [place], first generation American, 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  Moved to [facility] in late 2010's.  Enjoys healthful living, Mediterranean diet, staying active.  First husband passed away due to complications from diabetes.  She has since remarried.   Social Drivers of Corporate investment banker Strain: Low Risk  (11/11/2022)   Overall Financial Resource Strain (CARDIA)    Difficulty of Paying Living Expenses: Not very hard  Food Insecurity: No Food Insecurity (11/11/2022)   Hunger Vital Sign    Worried About Running Out of Food in the Last Year: Never true    Ran Out of Food in the Last Year: Never true  Transportation Needs: No Transportation Needs (11/11/2022)   PRAPARE - Administrator, Civil Service (Medical): No    Lack of Transportation (Non-Medical): No  Physical Activity: Sufficiently Active (11/11/2022)   Exercise Vital Sign    Days of Exercise per Week: 3 days    Minutes of Exercise per Session: 50 min  Stress: No Stress Concern Present (11/11/2022)   [employer] of Occupational Health - Occupational Stress Questionnaire    Feeling of Stress : Only a little  Social Connections: Socially Integrated (11/11/2022)   Social Connection and Isolation Panel [NHANES]    Frequency of Communication with Friends and Family: More than three times a week    Frequency of Social Gatherings with Friends and Family: More than three times a week    Attends Religious Services: More than 4 times per year    Active Member of [employer] or Organizations: Yes    Attends Engineer, structural: More than 4 times per year    Marital Status: Married  Catering manager Violence: Not At Risk (11/11/2022)   Humiliation, Afraid, Rape, and Kick questionnaire    Fear of Current or Ex-Partner: No    Emotionally Abused: No    Physically Abused: No    Sexually Abused: No    Review of Systems: ROS negative except for what is noted on the assessment and plan.  Vitals:   07/02/23 1546 07/02/23 1704 07/02/23 1705 07/02/23 1707  BP:  (!) 96/59 (!) 107/57 (!) 100/58   Pulse:  63 64 72  SpO2: 99%     Weight: 147 lb 12.8 oz (67 kg)     Height: 5' 5.5" (1.664 m)      Physical Exam: Constitutional: well-appearing in NAD HENT: normocephalic atraumatic, mucous membranes moist Eyes: conjunctiva non-erythematous Cardiovascular: regular rate and rhythm, no m/r/g Pulmonary/Chest: normal work of breathing on room air, lungs clear to  auscultation bilaterally Abdominal: soft, non-tender, non-distended MSK: normal bulk and tone Neurological: alert & oriented x 3, no focal deficit, Head thrust test negative, Vertigo but no nystagmus with dix hallpike on the left but not on the right Skin: warm and dry Psych: normal mood and behavior  Assessment & Plan:   Patient discussed with Dr. Sol Blazing  Orthostatic hypotension Kristen Jacobs is a 78 y.o. who comes in for evaluation of vertigo that started on Thursday last week after her pilates class. Had to call her husband to get her as she could not drive. States she has been ataxic and losing balance. Denies recent URI symptoms. Had two episodes of vertigo in the past the last one over 6 years ago, the first one 15 years ago. No recent changes in her medications except for a compound treatment she gets from her ObGyn containing estrogen, progesterone, testosterone and DHEAS but was started on Sunday after her symptoms develop. She is taking Trazodone nightly for over 15 years and takes tramadol every now and then. Head Thrust test negative on exam, no fevers or URIs within the last week. Tympanic membranes are intact, she does have wax but no signs of infection. Dix-Hallpike was positive for vertigo but no nystagmus. She may have a component of BPPV. Her symptoms are not constant and intermittent. Neurologically intact otherwise. Orthostatic vitals are positive and improved after water intake. Likely component of BPPV and Orthostatic hypotension.   -Vestibular rehab offered, but patient declined.  -Keep hydration  -Precautions when  changing positions provided.  -Avoid tramadol use  -She is on trazodone which could be making her symptoms worse. Has been on this medication for many years, but if symptoms do not improve would consider decreasing dose. More prone to hypotension with aging.   Excessive ear wax, bilateral Counseled patient that she can use OTC ear wax removal kits. Avoid use of Q tips. She could also schedule an appointment for [place] to get them cleaned. Pt deferred appointment in clinic. She does state she had hearing loss and saw an audiologist in the past . Her last audiology test was over 7 years ago. She has not noticed worsening hearing. Offered referral for audiology but patient declined as she states her hearing has not significantly worsened.  Manuela Neptune, MD [facility] Internal Medicine, PGY-1 Phone: [phone] Date 07/02/2023 Time 5:42 PM

## 2023-07-04 ENCOUNTER — Encounter: Payer: Self-pay | Admitting: Internal Medicine

## 2023-07-07 ENCOUNTER — Other Ambulatory Visit: Payer: Self-pay | Admitting: Internal Medicine

## 2023-07-08 ENCOUNTER — Encounter: Payer: Self-pay | Admitting: Internal Medicine

## 2023-07-08 DIAGNOSIS — E785 Hyperlipidemia, unspecified: Secondary | ICD-10-CM

## 2023-07-08 LAB — HEPATIC FUNCTION PANEL
ALT: 12 IU/L (ref 0–32)
AST: 20 IU/L (ref 0–40)
Albumin: 3.9 g/dL (ref 3.8–4.8)
Alkaline Phosphatase: 62 IU/L (ref 44–121)
Bilirubin Total: 0.4 mg/dL (ref 0.0–1.2)
Bilirubin, Direct: 0.13 mg/dL (ref 0.00–0.40)
Total Protein: 5.7 g/dL — ABNORMAL LOW (ref 6.0–8.5)

## 2023-07-08 LAB — LIPID PANEL
Chol/HDL Ratio: 3 ratio (ref 0.0–4.4)
Cholesterol, Total: 169 mg/dL (ref 100–199)
HDL: 56 mg/dL (ref >39)
LDL Chol Calc (NIH): 98 mg/dL (ref 0–99)
Triglycerides: 83 mg/dL (ref 0–149)
VLDL Cholesterol Cal: 15 mg/dL (ref 5–40)

## 2023-07-08 LAB — BASIC METABOLIC PANEL
BUN/Creatinine Ratio: 19 (ref 12–28)
BUN: 18 mg/dL (ref 8–27)
CO2: 21 mmol/L (ref 20–29)
Calcium: 9.3 mg/dL (ref 8.7–10.3)
Chloride: 104 mmol/L (ref 96–106)
Creatinine, Ser: 0.93 mg/dL (ref 0.57–1.00)
Glucose: 81 mg/dL (ref 70–99)
Potassium: 4.4 mmol/L (ref 3.5–5.2)
Sodium: 139 mmol/L (ref 134–144)
eGFR: 63 mL/min/{1.73_m2} (ref >59)

## 2023-07-08 LAB — LIPOPROTEIN A (LPA): Lipoprotein (a): 131.1 nmol/L — ABNORMAL HIGH (ref <75.0)

## 2023-07-08 NOTE — Progress Notes (Signed)
 Internal Medicine Clinic Attending  Case discussed with the resident at the time of the visit.  We reviewed the resident's history and exam and pertinent patient test results.  I agree with the assessment, diagnosis, and plan of care documented in the resident's note.

## 2023-07-08 NOTE — Addendum Note (Signed)
 Addended by: Dickie La on: 07/08/2023 11:14 AM   Modules accepted: Level of Service

## 2023-07-09 MED ORDER — ATORVASTATIN CALCIUM 80 MG PO TABS: 80.0000 mg | ORAL_TABLET | Freq: Every day | ORAL | 3 refills | Status: DC

## 2023-07-09 NOTE — Telephone Encounter (Addendum)
-----   Message from Orbie Pyo sent at 07/08/2023  8:15 AM EST ----- Increase atorva to 80, lipids/lfts in 2 mo, goal LDL < 55 given elevated LpA _______________________________________________________  Called patient and reviewed results in detail as well as recommendations.  All questions/concerns have been addressed.  Lab orders placed and released.

## 2023-07-12 ENCOUNTER — Other Ambulatory Visit: Payer: Self-pay | Admitting: Rheumatology

## 2023-07-13 ENCOUNTER — Other Ambulatory Visit: Payer: Self-pay | Admitting: Rheumatology

## 2023-07-13 NOTE — Telephone Encounter (Signed)
 Last Fill: 04/27/2023  Eye exam: 09/09/2022 WNL   Labs: 06/05/2023 CMP Creat 1.07 eGFR 53 Total Protein 5.8  05/01/2023 CBC CBC is normal.   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   Okay to refill Plaquenil?

## 2023-07-15 MED ORDER — HYDROXYCHLOROQUINE SULFATE 200 MG PO TABS: ORAL_TABLET | ORAL | 0 refills | Status: DC

## 2023-07-15 NOTE — Addendum Note (Signed)
 Addended by: Henriette Combs on: 07/15/2023 03:54 PM   Modules accepted: Orders

## 2023-07-15 NOTE — Telephone Encounter (Signed)
 Patient contacted the office and states she needs her prescription sent to the [facility] instead of [facility] because it was too expensive.

## 2023-07-17 ENCOUNTER — Encounter: Payer: Self-pay | Admitting: Sports Medicine

## 2023-07-17 ENCOUNTER — Ambulatory Visit: Admitting: Sports Medicine

## 2023-07-17 ENCOUNTER — Ambulatory Visit

## 2023-07-17 DIAGNOSIS — M25521 Pain in right elbow: Secondary | ICD-10-CM

## 2023-07-17 DIAGNOSIS — M19021 Primary osteoarthritis, right elbow: Secondary | ICD-10-CM | POA: Diagnosis not present

## 2023-07-17 HISTORY — DX: Pain in right elbow: M25.521

## 2023-07-17 NOTE — Progress Notes (Signed)
    Procedures performed today:    None.  Independent interpretation of notes and tests performed by another provider:   None.  Brief History, Exam, Impression, and Recommendations:    Right elbow pain This pleasant 78 year old female returns, she has been doing a lot of working out in the gym, she has started to have pain that she localizes anterior elbow. She is currently about 20 pounds. She has been increasing her weight. She does have a history of cervical radiculopathy on the right which has historically been well-controlled. Now pain is predominantly right anterior elbow, nothing connecting the shoulder, elbow, neck, and no paresthesias in the hand. She does have discrete and concordant tenderness on palpation of the anterior elbow, she also has reproduction of pain with resisted supination consistent with a distal biceps tendinitis. She will cut her weights in half for 6 weeks, we will get x-rays, add biceps conditioning and return to see me in 6 weeks, MR +/- injection if not better.    ____________________________________________ Ihor Austin. Benjamin Stain, M.D., ABFM., CAQSM., AME. Primary Care and Sports Medicine [facility]  Adjunct Professor of Family Medicine  [employer] [facility] [employer]  Restaurant manager, fast food

## 2023-07-17 NOTE — Assessment & Plan Note (Signed)
 This pleasant 78 year old female returns, she has been doing a lot of working out in [employer], she has started to have pain that she localizes anterior elbow. She is currently about 20 pounds. She has been increasing her weight. She does have a history of cervical radiculopathy on the right which has historically been well-controlled. Now pain is predominantly right anterior elbow, nothing connecting the shoulder, elbow, neck, and no paresthesias in the hand. She does have discrete and concordant tenderness on palpation of the anterior elbow, she also has reproduction of pain with resisted supination consistent with a distal biceps tendinitis. She will cut her weights in half for 6 weeks, we will get x-rays, add biceps conditioning and return to see me in 6 weeks, MR +/- injection if not better.

## 2023-07-19 NOTE — Progress Notes (Unsigned)
 [facility] Telephone:([phone]) [phone]   [phone]  INITIAL CONSULT NOTE  Patient Care Team: Miguel Aschoff, MD as PCP - General (Internal Medicine) Orbie Pyo, MD as PCP - Cardiology (Cardiology)  Hematological/Oncological History # IgG Kappa Monoclonal Gammopathy  06/05/2023: SPEP showed no M protein, but IFE detected a monoclonal IgG Kappa 07/19/2023: establish care with Dr. Leonides Schanz   CHIEF COMPLAINTS/PURPOSE OF CONSULTATION:  "IgG Kappa Monoclonal Gammopathy  "  HISTORY OF PRESENTING ILLNESS:  Kristen Jacobs 78 y.o. 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  On review of the previous records Ms. Collard had labs drawn on 06/05/2023 which showed no M protein, but IFE detected a monoclonal IgG kappa protein.  Due to concern for these findings the patient was referred to hematology for further evaluation and management.  On exam today Ms. Fritze reports that she had 2 abnormal blood tests with rheumatology.  She reports that she does have rheumatoid arthritis which is currently under good control with Plaquenil.  She notes that she has had no recent infectious symptoms such as runny nose, sore throat, or cough.  She denies any fevers, chills, sweats.  On further discussion she reports that her father had lung cancer and her mother died at 69 of old age.  She has a brother with CLL.  She has 2 healthy biological children.  She reports that she is a former smoker having quit in 1990.  She reports she also does not drink any alcohol.  She smoked for about 25 years.  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  She notes that she is currently married and has been married for 5 years.  She denies any bone pain or back pain.  Her weight has been stable and she has had no other major changes in her health.  A full  10 point ROS was otherwise negative.  MEDICAL HISTORY:  Past Medical History:  Diagnosis Date   Avulsion fracture of left talus 09/05/2019   Bilateral foot pain    Constipation    Depression    Forgetfulness    Hepatitis    History of UTI 11/22/2020   Recent event, fatigue w/o dysuria, symptoms resolved with antibiotic (name not recalled).  Urine POC dipstick normal today.    Joint pain    Laryngopharyngeal reflux     SURGICAL HISTORY: Past Surgical History:  Procedure Laterality Date   BREAST BIOPSY Bilateral    benign   CATARACT EXTRACTION Bilateral 03/2020   LAPAROSCOPY     LIVER BIOPSY     OOPHORECTOMY     right    TONSILLECTOMY     age 83     SOCIAL HISTORY: Social History   Socioeconomic History   Marital status: Married    Spouse name: Not on file   Number of children: Not on file   Years of education: Not on file   Highest education level: Not on file  Occupational History   Not on file  Tobacco Use   Smoking status: Former    Current packs/day: 0.00    Average packs/day: 3.0 packs/day for 26.0 years (78.0 ttl pk-yrs)    Types: Cigarettes    Start date: 27    Quit date: 7    Years since quitting: 35.2    Passive exposure: Current (Husband smokes cigars)   Smokeless tobacco: Never  Vaping  Use   Vaping status: Never Used  Substance and Sexual Activity   Alcohol use: Not Currently    Comment: quit 1990   Drug use: Not Currently    Types: Marijuana   Sexual activity: Not Currently  Other Topics Concern   Not on file  Social History Narrative   From [place], first generation American, 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  Moved to [place] [place] [facility] [place] [facility] in late 2010's.  Enjoys healthful living, Mediterranean diet, staying active.  First husband passed away due to complications from diabetes.  She has since remarried.   Social Drivers of Corporate investment banker Strain: Low Risk  (11/11/2022)   Overall Financial Resource Strain (CARDIA)    Difficulty of  Paying Living Expenses: Not very hard  Food Insecurity: No Food Insecurity (11/11/2022)   Hunger Vital Sign    Worried About Running Out of Food in the Last Year: Never true    Ran Out of Food in the Last Year: Never true  Transportation Needs: No Transportation Needs (11/11/2022)   PRAPARE - Administrator, Civil Service (Medical): No    Lack of Transportation (Non-Medical): No  Physical Activity: Sufficiently Active (11/11/2022)   Exercise Vital Sign    Days of Exercise per Week: 3 days    Minutes of Exercise per Session: 50 min  Stress: No Stress Concern Present (11/11/2022)   [employer] of Occupational Health - Occupational Stress Questionnaire    Feeling of Stress : Only a little  Social Connections: Socially Integrated (11/11/2022)   Social Connection and Isolation Panel [NHANES]    Frequency of Communication with Friends and Family: More than three times a week    Frequency of Social Gatherings with Friends and Family: More than three times a week    Attends Religious Services: More than 4 times per year    Active Member of [employer] or Organizations: Yes    Attends Engineer, structural: More than 4 times per year    Marital Status: Married  Catering manager Violence: Not At Risk (11/11/2022)   Humiliation, Afraid, Rape, and Kick questionnaire    Fear of Current or Ex-Partner: No    Emotionally Abused: No    Physically Abused: No    Sexually Abused: No    FAMILY HISTORY: Family History  Problem Relation Age of Onset   Dementia Mother    Cancer - Lung Father    Bipolar disorder Sister    Leukemia Brother    Healthy Son    Asthma Son    Healthy Son    Breast cancer Neg Hx     ALLERGIES:  is allergic to other and sulfa antibiotics.  MEDICATIONS:  Current Outpatient Medications  Medication Sig Dispense Refill   Ascorbic Acid (VITAMIN C) 100 MG CHEW Chew by mouth.     aspirin EC 81 MG tablet Take 1 tablet (81 mg total) by mouth daily. Swallow whole.      atorvastatin (LIPITOR) 80 MG tablet Take 1 tablet (80 mg total) by mouth daily. (Patient not taking: Reported on 07/20/2023) 90 tablet 3   b complex vitamins capsule Take 1 capsule by mouth daily.     Cholecalciferol (VITAMIN D3 PO) Take 5,000 Units by mouth daily. With vitamin K     citalopram (CELEXA) 20 MG tablet Take 1 tablet (20 mg total) by mouth daily. 90 tablet 3   EPINEPHrine (EPIPEN 2-PAK) 0.3 mg/0.3 mL IJ SOAJ injection Inject 0.3 mg into the muscle as needed for  anaphylaxis. 1 each 2   Glucosamine 500 MG CAPS Take 1 capsule by mouth. (Wild Shrimp Glucosamine)     hydroxychloroquine (PLAQUENIL) 200 MG tablet TAKE 1 TABLET BY MOUTH TWICE  DAILY MONDAY THROUGH FRIDAY  ONLY. NONE ON SATURDAY OR SUNDAY 120 tablet 0   ketoconazole (NIZORAL) 2 % shampoo Apply topically as needed. (Patient not taking: Reported on 07/20/2023)     methocarbamol (ROBAXIN) 500 MG tablet Take 1 tablet (500 mg total) by mouth 3 (three) times daily. (Muscle relaxer) (Patient taking differently: Take 500 mg by mouth as needed. (Muscle relaxer)) 90 tablet 3   MISC NATURAL PRODUCTS PO Take 3 capsules by mouth daily. Bone Strength: Vitamin D3, Vitamin K1, Vitamin K2, Calcium, MAgnesium, Strontium, Vanadium     Prasterone, DHEA, (DHEA PO) Take by mouth daily.     traMADol (ULTRAM) 50 MG tablet Take 1 tablet (50 mg total) by mouth every 12 (twelve) hours as needed. 60 tablet 3   traZODone (DESYREL) 100 MG tablet Take 1 tablet (100 mg total) by mouth at bedtime. 90 tablet 3   UNABLE TO FIND Med Name: bio identials     Zinc 10 MG LOZG Use as directed in the mouth or throat.     No current facility-administered medications for this visit.    REVIEW OF SYSTEMS:   Constitutional: ( - ) fevers, ( - )  chills , ( - ) night sweats Eyes: ( - ) blurriness of vision, ( - ) double vision, ( - ) watery eyes Ears, nose, mouth, throat, and face: ( - ) mucositis, ( - ) sore throat Respiratory: ( - ) cough, ( - ) dyspnea, ( - )  wheezes Cardiovascular: ( - ) palpitation, ( - ) chest discomfort, ( - ) lower extremity swelling Gastrointestinal:  ( - ) nausea, ( - ) heartburn, ( - ) change in bowel habits Skin: ( - ) abnormal skin rashes Lymphatics: ( - ) new lymphadenopathy, ( - ) easy bruising Neurological: ( - ) numbness, ( - ) tingling, ( - ) new weaknesses Behavioral/Psych: ( - ) mood change, ( - ) new changes  All other systems were reviewed with the patient and are negative.  PHYSICAL EXAMINATION:  Vitals:   07/20/23 1323  BP: 114/68  Pulse: 64  Resp: 13  Temp: 97.7 F (36.5 C)  SpO2: 100%   Filed Weights   07/20/23 1323  Weight: 144 lb 9.6 oz (65.6 kg)    GENERAL: well appearing elderly Caucasian female in NAD  SKIN: skin color, texture, turgor are normal, no rashes or significant lesions EYES: conjunctiva are pink and non-injected, sclera clear LUNGS: clear to auscultation and percussion with normal breathing effort HEART: regular rate & rhythm and no murmurs and no lower extremity edema Musculoskeletal: no cyanosis of digits and no clubbing  PSYCH: alert & oriented x 3, fluent speech NEURO: no focal motor/sensory deficits  LABORATORY DATA:  I have reviewed the data as listed    Latest Ref Rng & Units 07/20/2023    2:32 PM 05/01/2023   10:34 AM 12/02/2022   10:51 AM  CBC  WBC 4.0 - 10.5 K/uL 5.6  6.1  6.3   Hemoglobin 12.0 - 15.0 g/dL 40.9  81.1  91.4   Hematocrit 36.0 - 46.0 % 40.3  43.6  39.5   Platelets 150 - 400 K/uL 230  252  233        Latest Ref Rng & Units 07/20/2023    2:32  PM 07/07/2023   10:12 AM 07/07/2023   10:11 AM  CMP  Glucose 70 - 99 mg/dL 86   81   BUN 8 - 23 mg/dL 19   18   Creatinine 1.61 - 1.00 mg/dL 0.96   0.45   Sodium 409 - 145 mmol/L 136   139   Potassium 3.5 - 5.1 mmol/L 4.7   4.4   Chloride 98 - 111 mmol/L 104   104   CO2 22 - 32 mmol/L 28   21   Calcium 8.9 - 10.3 mg/dL 9.2   9.3   Total Protein 6.5 - 8.1 g/dL 6.5  5.7    Total Bilirubin 0.0 - 1.2  mg/dL 0.5  0.4    Alkaline Phos 38 - 126 U/L 56  62    AST 15 - 41 U/L 26  20    ALT 0 - 44 U/L 18  12       ASSESSMENT & PLAN Marshia Ly 79 y.o. 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  After review of the labs, review of the records, and discussion with the patient the patients findings are most consistent with a monoclonal gammopathy, IgG kappa specificity  Monoclonal Gammopathies are a group of medical conditions defined by the presence of a monoclonal protein (an M protein) in the blood or urine. Monoclonal gammopathies include monoclonal gammopathy of unknown significance (MGUS), Monoclonal gammopathies of renal or neurological significance,  smoldering multiple myeloma (SMM), multiple myeloma (MM), AL amyloidosis, and Waldenstrom macroglobulinemia. The goal of the initial workup is to determine which monoclonal gammopathy a patient has. The workup consists of evaluating protein in the serum (with serum protein electrophoresis (SPEP) and serum free light chains) , evaluating protein in the urine (UPEP), and evaluation of the skeleton (DG Bone Met Survey) to assure no lytic lesions. Baseline bloodwork includes CMP and CBC. If no CRAB criteria or high risk criteria are noted then the diagnosis is MGUS. MGUS must be followed with bloodwork periodically to assure it does not convert to multiple myeloma (occurs to approximately 1% of patients per year). If there are CRAB criteria or high risk features (such as elevated serum free light chain ratio (taking into account renal function), a non IgG M protein, or M protein >1.5) then a bone marrow biopsy must be pursued.    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  --RTC in 6 months or sooner if intervention is required.    Orders Placed This Encounter  Procedures   CBC with Differential (Cancer Center Only)    Standing Status:   Future    Number of Occurrences:   1    Expiration Date:   07/19/2024   CMP (Cancer Center only)    Standing Status:   Future    Number of Occurrences:   1    Expiration Date:   07/19/2024   Lactate dehydrogenase (LDH)    Standing Status:   Future    Number of Occurrences:   1    Expiration Date:   07/19/2024   Multiple Myeloma Panel (SPEP&IFE w/QIG)  Standing Status:   Future    Number of Occurrences:   1    Expiration Date:   07/19/2024   Kappa/lambda light chains    Standing Status:   Future    Number of Occurrences:   1    Expiration Date:   07/19/2024   24-Hr Ur UPEP/UIFE/Light Chains/TP    Standing Status:   Future    Expiration Date:   07/19/2024    All questions were answered. The patient knows to call the clinic with any problems, questions or concerns.  A total of more than 60 minutes were spent on this encounter with face-to-face time and non-face-to-face time, including preparing to see the patient, ordering tests and/or medications, counseling the patient and coordination of care as outlined above.   Ulysees Barns, MD Department of Hematology/Oncology [facility] Phone: [phone] Pager: [phone] Email: [email or web address]  07/23/2023 9:35 AM

## 2023-07-20 ENCOUNTER — Inpatient Hospital Stay: Payer: Medicare Other | Attending: Hematology and Oncology | Admitting: Hematology and Oncology

## 2023-07-20 ENCOUNTER — Inpatient Hospital Stay: Payer: Medicare Other

## 2023-07-20 VITALS — BP 114/68 | HR 64 | Temp 97.7°F | Resp 13 | Wt 144.6 lb

## 2023-07-20 DIAGNOSIS — Z87891 Personal history of nicotine dependence: Secondary | ICD-10-CM | POA: Insufficient documentation

## 2023-07-20 DIAGNOSIS — Z9089 Acquired absence of other organs: Secondary | ICD-10-CM | POA: Diagnosis not present

## 2023-07-20 DIAGNOSIS — D472 Monoclonal gammopathy: Secondary | ICD-10-CM | POA: Diagnosis not present

## 2023-07-20 DIAGNOSIS — Z87440 Personal history of urinary (tract) infections: Secondary | ICD-10-CM | POA: Insufficient documentation

## 2023-07-20 DIAGNOSIS — Z818 Family history of other mental and behavioral disorders: Secondary | ICD-10-CM | POA: Diagnosis not present

## 2023-07-20 DIAGNOSIS — Z801 Family history of malignant neoplasm of trachea, bronchus and lung: Secondary | ICD-10-CM | POA: Diagnosis not present

## 2023-07-20 DIAGNOSIS — Z806 Family history of leukemia: Secondary | ICD-10-CM | POA: Insufficient documentation

## 2023-07-20 DIAGNOSIS — Z79899 Other long term (current) drug therapy: Secondary | ICD-10-CM | POA: Insufficient documentation

## 2023-07-20 DIAGNOSIS — E785 Hyperlipidemia, unspecified: Secondary | ICD-10-CM | POA: Insufficient documentation

## 2023-07-20 DIAGNOSIS — F32A Depression, unspecified: Secondary | ICD-10-CM | POA: Insufficient documentation

## 2023-07-20 DIAGNOSIS — Z825 Family history of asthma and other chronic lower respiratory diseases: Secondary | ICD-10-CM | POA: Insufficient documentation

## 2023-07-20 DIAGNOSIS — Z90721 Acquired absence of ovaries, unilateral: Secondary | ICD-10-CM | POA: Diagnosis not present

## 2023-07-20 DIAGNOSIS — M069 Rheumatoid arthritis, unspecified: Secondary | ICD-10-CM | POA: Diagnosis not present

## 2023-07-20 DIAGNOSIS — Z882 Allergy status to sulfonamides status: Secondary | ICD-10-CM | POA: Diagnosis not present

## 2023-07-20 DIAGNOSIS — C9000 Multiple myeloma not having achieved remission: Secondary | ICD-10-CM | POA: Insufficient documentation

## 2023-07-20 DIAGNOSIS — Z7722 Contact with and (suspected) exposure to environmental tobacco smoke (acute) (chronic): Secondary | ICD-10-CM

## 2023-07-20 DIAGNOSIS — K5900 Constipation, unspecified: Secondary | ICD-10-CM | POA: Diagnosis not present

## 2023-07-20 LAB — CBC WITH DIFFERENTIAL (CANCER CENTER ONLY)
Abs Immature Granulocytes: 0.02 10*3/uL (ref 0.00–0.07)
Basophils Absolute: 0.1 10*3/uL (ref 0.0–0.1)
Basophils Relative: 1 %
Eosinophils Absolute: 0.3 10*3/uL (ref 0.0–0.5)
Eosinophils Relative: 5 %
HCT: 40.3 % (ref 36.0–46.0)
Hemoglobin: 13.5 g/dL (ref 12.0–15.0)
Immature Granulocytes: 0 %
Lymphocytes Relative: 23 %
Lymphs Abs: 1.3 10*3/uL (ref 0.7–4.0)
MCH: 29.5 pg (ref 26.0–34.0)
MCHC: 33.5 g/dL (ref 30.0–36.0)
MCV: 88.2 fL (ref 80.0–100.0)
Monocytes Absolute: 0.6 10*3/uL (ref 0.1–1.0)
Monocytes Relative: 10 %
Neutro Abs: 3.4 10*3/uL (ref 1.7–7.7)
Neutrophils Relative %: 61 %
Platelet Count: 230 10*3/uL (ref 150–400)
RBC: 4.57 MIL/uL (ref 3.87–5.11)
RDW: 11.9 % (ref 11.5–15.5)
WBC Count: 5.6 10*3/uL (ref 4.0–10.5)
nRBC: 0 % (ref 0.0–0.2)

## 2023-07-20 LAB — LACTATE DEHYDROGENASE: LDH: 157 U/L (ref 98–192)

## 2023-07-20 LAB — CMP (CANCER CENTER ONLY)
ALT: 18 U/L (ref 0–44)
AST: 26 U/L (ref 15–41)
Albumin: 4.2 g/dL (ref 3.5–5.0)
Alkaline Phosphatase: 56 U/L (ref 38–126)
Anion gap: 4 — ABNORMAL LOW (ref 5–15)
BUN: 19 mg/dL (ref 8–23)
CO2: 28 mmol/L (ref 22–32)
Calcium: 9.2 mg/dL (ref 8.9–10.3)
Chloride: 104 mmol/L (ref 98–111)
Creatinine: 1 mg/dL (ref 0.44–1.00)
GFR, Estimated: 58 mL/min — ABNORMAL LOW (ref >60)
Glucose, Bld: 86 mg/dL (ref 70–99)
Potassium: 4.7 mmol/L (ref 3.5–5.1)
Sodium: 136 mmol/L (ref 135–145)
Total Bilirubin: 0.5 mg/dL (ref 0.0–1.2)
Total Protein: 6.5 g/dL (ref 6.5–8.1)

## 2023-07-21 LAB — KAPPA/LAMBDA LIGHT CHAINS
Kappa free light chain: 140.3 mg/L — ABNORMAL HIGH (ref 3.3–19.4)
Kappa, lambda light chain ratio: 8.88 — ABNORMAL HIGH (ref 0.26–1.65)
Lambda free light chains: 15.8 mg/L (ref 5.7–26.3)

## 2023-07-22 LAB — MULTIPLE MYELOMA PANEL, SERUM
Albumin SerPl Elph-Mcnc: 3.5 g/dL (ref 2.9–4.4)
Albumin/Glob SerPl: 1.5 (ref 0.7–1.7)
Alpha 1: 0.2 g/dL (ref 0.0–0.4)
Alpha2 Glob SerPl Elph-Mcnc: 0.8 g/dL (ref 0.4–1.0)
B-Globulin SerPl Elph-Mcnc: 0.8 g/dL (ref 0.7–1.3)
Gamma Glob SerPl Elph-Mcnc: 0.6 g/dL (ref 0.4–1.8)
Globulin, Total: 2.5 g/dL (ref 2.2–3.9)
IgA: 124 mg/dL (ref 64–422)
IgG (Immunoglobin G), Serum: 667 mg/dL (ref 586–1602)
IgM (Immunoglobulin M), Srm: 78 mg/dL (ref 26–217)
M Protein SerPl Elph-Mcnc: 0.2 g/dL — ABNORMAL HIGH
Total Protein ELP: 6 g/dL (ref 6.0–8.5)

## 2023-07-23 ENCOUNTER — Telehealth: Payer: Self-pay | Admitting: *Deleted

## 2023-07-23 DIAGNOSIS — D472 Monoclonal gammopathy: Secondary | ICD-10-CM

## 2023-07-23 NOTE — Telephone Encounter (Signed)
 TCT patient regarding upcoming appts. No answer but was able to leave vm message for pt to return this call at her convenience to [phone]

## 2023-07-23 NOTE — Telephone Encounter (Signed)
-----   Message from Ulysees Barns IV sent at 07/23/2023  9:37 AM EDT ----- Please let Ms. Bacot know that based on her lab work so far we do require a bone marrow biopsy.  An order has been placed for the bone marrow biopsy.  We do still require her bone survey as well as 24-hour urine.  Once all these results have returned we will bring her back to clinic to discuss neck steps. ----- Message ----- From: Interface, [facility] [place] Sent: 07/20/2023   2:54 PM EDT To: Jaci Standard, MD

## 2023-07-24 NOTE — Telephone Encounter (Signed)
 Received call back from patient regarding recent lab results and upcoming procedures that Dr. Leonides Schanz has ordered. Advisethat based on her lab work so far we do require a bone marrow biopsy. An order has been placed for the bone marrow biopsy. We do still require her bone survey as well as 24-hour urine. Once all these results have returned we will bring her back to clinic to discuss next steps. Clarified with patient that these tests will determine whether she just has MGUS or if it has transitioned to Multiple Myeloma. Advised that MGUS requires only surveillance whereas Multiple Myeloma does require treatment. Advised that her upcoming procedures will give Dr. Leonides Schanz guidance as to next steps for her.  Pt voiced understanding.

## 2023-07-31 ENCOUNTER — Encounter: Payer: Self-pay | Admitting: Sports Medicine

## 2023-07-31 ENCOUNTER — Ambulatory Visit

## 2023-07-31 ENCOUNTER — Ambulatory Visit: Admitting: Sports Medicine

## 2023-07-31 DIAGNOSIS — M4722 Other spondylosis with radiculopathy, cervical region: Secondary | ICD-10-CM

## 2023-07-31 DIAGNOSIS — M5412 Radiculopathy, cervical region: Secondary | ICD-10-CM

## 2023-07-31 MED ORDER — PREDNISONE 50 MG PO TABS: ORAL_TABLET | ORAL | 0 refills | Status: DC

## 2023-07-31 MED ORDER — METHOCARBAMOL 500 MG PO TABS: 500.0000 mg | ORAL_TABLET | Freq: Three times a day (TID) | ORAL | 3 refills | Status: AC

## 2023-07-31 MED ORDER — TRAMADOL HCL 50 MG PO TABS: 50.0000 mg | ORAL_TABLET | Freq: Two times a day (BID) | ORAL | 3 refills | Status: DC | PRN

## 2023-07-31 MED ORDER — KETOROLAC TROMETHAMINE 60 MG/2ML IM SOLN
30.0000 mg | Freq: Once | INTRAMUSCULAR | Status: AC
  Administered 2023-07-31: 30 mg via INTRAMUSCULAR
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
## 2023-07-31 NOTE — Addendum Note (Signed)
 Addended by: Samule Dry on: 07/31/2023 09:51 AM   Modules accepted: Orders

## 2023-08-03 ENCOUNTER — Other Ambulatory Visit: Payer: Self-pay

## 2023-08-03 ENCOUNTER — Ambulatory Visit (HOSPITAL_COMMUNITY)
Admission: RE | Admit: 2023-08-03 | Discharge: 2023-08-03 | Disposition: A | Discharge status: Home / Self Care | Source: Ambulatory Visit | Attending: Hematology and Oncology | Admitting: Hematology and Oncology

## 2023-08-03 DIAGNOSIS — D472 Monoclonal gammopathy: Secondary | ICD-10-CM | POA: Diagnosis present

## 2023-08-03 DIAGNOSIS — C9000 Multiple myeloma not having achieved remission: Secondary | ICD-10-CM | POA: Diagnosis not present

## 2023-08-04 ENCOUNTER — Encounter: Payer: Self-pay | Admitting: Rheumatology

## 2023-08-06 ENCOUNTER — Encounter: Payer: Self-pay | Admitting: Sports Medicine

## 2023-08-12 LAB — UPEP/UIFE/LIGHT CHAINS/TP, 24-HR UR
Free Kappa Lt Chains,Ur: 19.02 mg/L (ref 1.17–86.46)
Free Kappa/Lambda Ratio: >27.57 — ABNORMAL HIGH (ref 1.83–14.26)
Free Lambda Lt Chains,Ur: <0.69 mg/L (ref 0.27–15.21)
Total Protein, Urine-Ur/day: 76 mg/(24.h) (ref 30–150)
Total Protein, Urine: 4.9 mg/dL
Total Volume: 1550

## 2023-08-14 ENCOUNTER — Ambulatory Visit (INDEPENDENT_AMBULATORY_CARE_PROVIDER_SITE_OTHER): Admitting: Sports Medicine

## 2023-08-14 ENCOUNTER — Encounter: Payer: Self-pay | Admitting: Sports Medicine

## 2023-08-14 ENCOUNTER — Encounter: Payer: Self-pay | Admitting: Internal Medicine

## 2023-08-14 DIAGNOSIS — M4722 Other spondylosis with radiculopathy, cervical region: Secondary | ICD-10-CM

## 2023-08-14 DIAGNOSIS — R5383 Other fatigue: Secondary | ICD-10-CM
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
## 2023-08-28 ENCOUNTER — Ambulatory Visit: Admitting: Sports Medicine

## 2023-08-31 ENCOUNTER — Other Ambulatory Visit (HOSPITAL_COMMUNITY)

## 2023-08-31 ENCOUNTER — Ambulatory Visit (HOSPITAL_COMMUNITY)

## 2023-09-08 NOTE — Progress Notes (Signed)
 Office Visit Note  Patient: Kristen Jacobs             Date of Birth: 19-Dec-1945           MRN: [id number]             PCP: Sherol Dixie, MD Referring: Sherol Dixie, MD Visit Date: 09/22/2023 Occupation: @GUAROCC @  Subjective:  Medication monitoring   History of Present Illness: Kristen Jacobs is a 78 y.o. female with history of seropositive rheumatoid arthritis and osteoarthritis.  Patient remains on  Plaquenil  200 mg 1 tablet by mouth twice daily Monday to Friday.  She is tolerating Plaquenil  without any side effects and has not had any recent gaps in therapy.  She denies any signs or symptoms of a rheumatoid arthritis flare.  She occasionally has fleeting pain in the left hand but has not needed to take any over-the-counter products for symptomatic relief.  She denies any nocturnal pain or difficulty performing ADLs. Patient states that she is scheduled for a plaquenil  eye examination on Thursday.  Patient states she will also be having updated lab work with her cardiologist soon.   Activities of Daily Living:  Patient reports morning stiffness for a few minutes.   Patient Denies nocturnal pain.  Difficulty dressing/grooming: Denies Difficulty climbing stairs: Denies Difficulty getting out of chair: Denies Difficulty using hands for taps, buttons, cutlery, and/or writing: Reports  Review of Systems  Constitutional:  Positive for fatigue.  HENT:  Positive for mouth dryness. Negative for mouth sores and nose dryness.   Eyes:  Positive for dryness. Negative for pain.  Respiratory:  Negative for shortness of breath and difficulty breathing.   Cardiovascular:  Negative for chest pain and palpitations.  Gastrointestinal:  Negative for blood in stool, constipation and diarrhea.  Endocrine: Negative for increased urination.  Genitourinary:  Negative for involuntary urination.  Musculoskeletal:  Positive for myalgias, muscle weakness, morning stiffness, muscle tenderness  and myalgias. Negative for joint pain, gait problem, joint pain and joint swelling.  Skin:  Negative for color change, rash, hair loss and sensitivity to sunlight.  Allergic/Immunologic: Negative for susceptible to infections.  Neurological:  Negative for dizziness and headaches.  Hematological:  Negative for swollen glands.  Psychiatric/Behavioral:  Negative for depressed mood and sleep disturbance. The patient is not nervous/anxious.     PMFS History:  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   Fear of vaccinations 02/22/2021   History of malignant neoplasm of skin 11/22/2020   Age-related osteoporosis without fracture 11/22/2020   Major depression in remission (HCC) 11/22/2020   History of hepatitis C 04/05/2020   Cervical spondylosis with radiculopathy 02/21/2020   Rheumatoid factor positive 02/17/2020   Laryngopharyngeal reflux (LPR) 12/20/2019   Primary osteoarthritis of left knee 07/19/2019   Subacromial bursitis of left shoulder joint 07/19/2019   Bilateral wrist pain, 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 05/24/2019   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   Chronic right shoulder pain 11/12/2017   Pain of right sacroiliac joint 09/10/2017   Chronic right hip pain 08/11/2017    Past Medical  History:  Diagnosis Date   Avulsion fracture of left talus 09/05/2019   Bilateral foot pain    Constipation    Depression    Forgetfulness    Hepatitis    History of UTI 11/22/2020   Recent event, fatigue w/o dysuria, symptoms resolved with antibiotic  (name not recalled).  Urine POC dipstick normal today.    Joint pain    Laryngopharyngeal reflux     Family History  Problem Relation Age of Onset   Dementia Mother    Cancer - Lung Father    Bipolar disorder Sister    Rectal cancer Sister    Leukemia Brother    Healthy Son    Asthma Son    Healthy Son    Breast cancer Neg Hx    Past Surgical History:  Procedure Laterality Date   BONE BIOPSY  09/21/2023   BREAST BIOPSY Bilateral    benign   CATARACT EXTRACTION Bilateral 03/2020   LAPAROSCOPY     LIVER BIOPSY     OOPHORECTOMY     right    TONSILLECTOMY     age 61    Social History   Social History Narrative   From [place], first Education officer, environmental, 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  Moved to [facility] in late 2010's.  Enjoys healthful living, Mediterranean diet, staying active.  First husband passed away due to complications from diabetes.  She has since remarried.   Immunization History  Administered Date(s) Administered   Moderna Sars-Covid-2 Vaccination 11/17/2019, 12/05/2019, 01/02/2020     Objective: Vital Signs: BP (!) 94/54 (BP Location: Left Arm, Patient Position: Sitting, Cuff Size: Normal)   Pulse 62   Resp 13   Ht 5' 5.5" (1.664 m)   Wt 141 lb 9.6 oz (64.2 kg)   BMI 23.21 kg/m    Physical Exam Vitals and nursing note reviewed.  Constitutional:      Appearance: She is well-developed.  HENT:     Head: Normocephalic and atraumatic.  Eyes:     Conjunctiva/sclera: Conjunctivae normal.  Cardiovascular:     Rate and Rhythm: Normal rate and regular rhythm.     Heart sounds: Normal heart sounds.  Pulmonary:     Effort: Pulmonary effort is normal.     Breath sounds: Normal breath sounds.  Abdominal:     General: Bowel sounds are normal.     Palpations: Abdomen is soft.  Musculoskeletal:     Cervical back: Normal range of motion.  Lymphadenopathy:     Cervical: No cervical adenopathy.  Skin:    General: Skin is warm and dry.     Capillary  Refill: Capillary refill takes less than 2 seconds.  Neurological:     Mental Status: She is alert and oriented to person, place, and time.  Psychiatric:        Behavior: Behavior normal.      Musculoskeletal Exam: C-spine, thoracic spine, lumbar spine good range of motion.  Shoulder joints, elbow joints, wrist joints MCPs, PIPs and DIPs have good range of motion with no synovitis.  Complete fist formation bilaterally.  Hip joints have good range of motion with no groin pain.  Knee joints have good range of motion no warmth or effusion.  Ankle joints have good range of motion with no discomfort.  CDAI Exam: CDAI Score: -- Patient Global: --; Provider Global: -- Swollen: --; Tender: -- Joint Exam 09/22/2023   No joint exam has been documented for this visit   There is currently no  information documented on the homunculus. Go to the Rheumatology activity and complete the homunculus joint exam.  Investigation: No additional findings.  Imaging: CT BONE MARROW BIOPSY & ASPIRATION Result Date: 09/21/2023 INDICATION: requesting bone marrow biopsy to r/o multiple myeloma EXAM: CT GUIDED BONE MARROW ASPIRATION AND CORE BIOPSY MEDICATIONS: None. ANESTHESIA/SEDATION: Moderate (conscious) sedation was employed during this procedure. A total of Versed  1 mg and Fentanyl  50 mcg was administered intravenously. Moderate Sedation Time: 14 minutes. The patient's level of consciousness and vital signs were monitored continuously by radiology nursing throughout the procedure under my direct supervision. FLUOROSCOPY TIME:  CT dose; 144 mGycm COMPLICATIONS: None immediate. Estimated blood loss: <5 mL PROCEDURE: RADIATION DOSE REDUCTION: This exam was performed according to the departmental dose-optimization program which includes automated exposure control, adjustment of the mA and/or kV according to patient size and/or use of iterative reconstruction technique. Informed written consent was obtained from the  patient after a thorough discussion of the procedural risks, benefits and alternatives. All questions were addressed. Maximal Sterile Barrier Technique was utilized including caps, mask, sterile gowns, sterile gloves, sterile drape, hand hygiene and skin antiseptic. A timeout was performed prior to the initiation of the procedure. The patient was positioned prone and non-contrast localization CT was performed of the pelvis to demonstrate the iliac marrow spaces. Maximal barrier sterile technique utilized including caps, mask, sterile gowns, sterile gloves, large sterile drape, hand hygiene, and chlorhexidine prep. Under sterile conditions and local anesthesia, an 11 gauge coaxial bone biopsy needle was advanced into the LEFT iliac marrow space. Needle position was confirmed with CT imaging. Initially, bone marrow aspiration was performed. Next, the 11 gauge outer cannula was utilized to obtain 1 iliac bone marrow core biopsy. Needle was removed. Hemostasis was obtained with compression. The patient tolerated the procedure well. Samples were prepared with the cytotechnologist. IMPRESSION: Successful CT-guided bone marrow aspiration and biopsy. Art Largo, MD [facility] Electronically Signed   By: Art Largo M.D.   On: 09/21/2023 10:11    Recent Labs: Lab Results  Component Value Date   WBC 5.3 09/21/2023   HGB 13.3 09/21/2023   PLT 217 09/21/2023   NA 136 07/20/2023   K 4.7 07/20/2023   CL 104 07/20/2023   CO2 28 07/20/2023   GLUCOSE 86 07/20/2023   BUN 19 07/20/2023   CREATININE 1.00 07/20/2023   BILITOT 0.5 07/20/2023   ALKPHOS 56 07/20/2023   AST 26 07/20/2023   ALT 18 07/20/2023   PROT 6.5 07/20/2023   ALBUMIN 4.2 07/20/2023   CALCIUM  9.2 07/20/2023   GFRAA 67 10/25/2020    Speciality Comments: PLQ eye exam: 09/09/2022 WNL  Opthamology f/u 12 months.  PLQ eye exam scheduled for 09/24/2023 per patient.  Procedures:   No procedures performed Allergies: Other and Sulfa antibiotics   Assessment / Plan:     Visit Diagnoses: Rheumatoid arthritis involving multiple sites with positive rheumatoid factor (HCC) - MRI of the wrist joint showed synovitis, tenosynovitis and possible erosions: She has no joint tenderness or synovitis on examination today.  She has not had any signs or symptoms of a rheumatoid arthritis flare.  She has clinically been doing well taking Plaquenil  200 mg 1 tablet by mouth twice daily Monday through Friday.  She is tolerating Plaquenil  without any side effects and has not had any recent gaps in therapy.  Her morning stiffness is only been lasting for a few minutes daily.  She has not been experiencing any increased  nocturnal pain or difficulty with ADLs.  She has not been having to take any pain relievers for symptomatic relief.  She will remain on Plaquenil  as monotherapy.  She was advised to notify us  if she develops signs or symptoms of a flare.  She will follow-up in the office in 5 months or sooner if needed.  High risk medication use - Plaquenil  200 mg 1 tablet by mouth twice daily Monday to Friday. PLQ eye exam: 09/09/2022 WNL [facility] f/u 12 months.  She is scheduled for an updated Plaquenil  eye examination on Thursday.  She was given a new Plaquenil  examination form to take with her to her appointment. CBC updated 09/21/23-within normal limits. CMP 07/20/23.  She will be having updated lab work with her cardiologist and plans to have an updated CMP with GFR at that time.  A handwritten order for CMP with GFR was provided to the patient today.  Discussed that she will continue to require updated lab work every 5 months while taking Plaquenil .  Pain in both hands: No tenderness or synovitis noted on examination today.  She is able to make a complete fist bilaterally.  She occasionally has fleeting pain in the left hand but has not needed to take any over-the-counter products for  pain relief.  Her symptoms are well-controlled taking Plaquenil  200 mg 1 tablet by mouth twice daily Monday through Friday.  Primary osteoarthritis of left knee: Not currently symptomatic.  No warmth or effusion noted.  Primary osteoarthritis of both feet: She is not experiencing any discomfort in her feet at this time.  DDD (degenerative disc disease), cervical - Dr. Larrie Po.  Good range of motion with no discomfort at this time.  Closed nondisplaced avulsion fracture of left talus with routine healing, subsequent encounter  MGUS (monoclonal gammopathy of unknown significance): Under care of Dr. Rosaline Coma.  Patient underwent a bone marrow biopsy yesterday to rule out multiple myeloma--results pending.   Age-related osteoporosis without fracture - 06/04/2022 DEXA scan: The BMD measured at Femur Total Right is 0.648 g/cm2 with a T-score of -2.9.  The use of Fosamax was discussed in detail after last visit on 06/05/2023.  Vitamin D  deficiency; She is taking vitamin D  5000 units daily.  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  Other fatigue  Anxiety  Orders: No orders of the defined types were placed in this encounter.  Meds ordered this encounter  Medications   hydroxychloroquine  (PLAQUENIL ) 200 MG tablet    Sig: TAKE 1 TABLET BY MOUTH TWICE  DAILY MONDAY THROUGH FRIDAY  ONLY. NONE ON SATURDAY OR SUNDAY    Dispense:  120 tablet    Refill:  0     Follow-Up Instructions: Return in about 5 months (around 02/22/2024) for Rheumatoid arthritis, Osteoarthritis.   Romayne Clubs, PA-C  Note - This record has been created using Dragon software.  Chart creation errors have been sought, but may not always  have been located. Such creation errors do not reflect on  the standard of medical care.

## 2023-09-13 ENCOUNTER — Other Ambulatory Visit: Payer: Self-pay | Admitting: Rheumatology

## 2023-09-18 ENCOUNTER — Other Ambulatory Visit: Payer: Self-pay | Admitting: Radiology

## 2023-09-18 DIAGNOSIS — D472 Monoclonal gammopathy: Secondary | ICD-10-CM

## 2023-09-18 NOTE — H&P (Signed)
 Chief Complaint: Monoclonal gammopathy; referred for image guided bone marrow biopsy to rule out multiple myeloma  Referring Provider(s): Dorsey,J  Supervising Physician: Art Largo  Patient Status: [facility] - Out-pt  History of Present Illness: Kristen Jacobs is a 78 y.o. female ex smoker with PMH sig for depression, RA, HLD, thoracic AAA who presents now with IgG kappa monoclonal gammopathy, positive M spike. She is scheduled today for CT guided bone marrow biopsy to rule out myeloma.    Patient is Full Code  Past Medical History:  Diagnosis Date   Avulsion fracture of left talus 09/05/2019   Bilateral foot pain    Constipation    Depression    Forgetfulness    Hepatitis    History of UTI 11/22/2020   Recent event, fatigue w/o dysuria, symptoms resolved with antibiotic (name not recalled).  Urine POC dipstick normal today.    Joint pain    Laryngopharyngeal reflux     Past Surgical History:  Procedure Laterality Date   BREAST BIOPSY Bilateral    benign   CATARACT EXTRACTION Bilateral 03/2020   LAPAROSCOPY     LIVER BIOPSY     OOPHORECTOMY     right    TONSILLECTOMY     age 58     Allergies: Other and Sulfa antibiotics  Medications: Prior to Admission medications   Medication Sig Start Date End Date Taking? Authorizing Provider  Ascorbic Acid (VITAMIN C) 100 MG CHEW Chew by mouth.    [provider]  aspirin  EC 81 MG tablet Take 1 tablet (81 mg total) by mouth daily. Swallow whole. 04/14/23   Thukkani, Arun K, MD  atorvastatin  (LIPITOR) 80 MG tablet Take 1 tablet (80 mg total) by mouth daily. 07/09/23   Thukkani, Arun K, MD  b complex vitamins capsule Take 1 capsule by mouth daily.    [provider]  Cholecalciferol (VITAMIN D3 PO) Take 5,000 Units by mouth daily. With vitamin K    [provider]  citalopram  (CELEXA ) 20 MG tablet Take 1 tablet (20 mg total) by mouth daily. 03/09/23   Sherol Dixie, MD  EPINEPHrine  (EPIPEN  2-PAK)  0.3 mg/0.3 mL IJ SOAJ injection Inject 0.3 mg into the muscle as needed for anaphylaxis. 11/11/22   Sherol Dixie, MD  Glucosamine 500 MG CAPS Take 1 capsule by mouth. (Wild Shrimp Glucosamine)    [provider]  hydroxychloroquine  (PLAQUENIL ) 200 MG tablet TAKE 1 TABLET BY MOUTH TWICE  DAILY MONDAY THROUGH FRIDAY  ONLY. NONE ON SATURDAY OR SUNDAY 07/15/23   Nicholas Bari, MD  ketoconazole (NIZORAL) 2 % shampoo Apply topically as needed. 04/20/22   [provider]  methocarbamol  (ROBAXIN ) 500 MG tablet Take 1 tablet (500 mg total) by mouth 3 (three) times daily. (Muscle relaxer) 07/31/23   Gean Keels, MD  MISC NATURAL PRODUCTS PO Take 3 capsules by mouth daily. Bone Strength: Vitamin D3, Vitamin K1, Vitamin K2, Calcium , MAgnesium, Strontium, Vanadium    [provider]  Prasterone, DHEA, (DHEA PO) Take by mouth daily.    [provider]  predniSONE  (DELTASONE ) 50 MG tablet One tab PO daily for 5 days. 07/31/23   Gean Keels, MD  traMADol  (ULTRAM ) 50 MG tablet Take 1 tablet (50 mg total) by mouth every 12 (twelve) hours as needed. 07/31/23   Gean Keels, MD  traZODone  (DESYREL ) 100 MG tablet Take 1 tablet (100 mg total) by mouth at bedtime. 03/09/23   Sherol Dixie, MD  Dorris Gaul  TO FIND Med Name: bio identials    [provider]  Zinc 10 MG LOZG Use as directed in the mouth or throat.    [provider]     Family History  Problem Relation Age of Onset   Dementia Mother    Cancer - Lung Father    Bipolar disorder Sister    Leukemia Brother    Healthy Son    Asthma Son    Healthy Son    Breast cancer Neg Hx     Social History   Socioeconomic History   Marital status: Married    Spouse name: Not on file   Number of children: Not on file   Years of education: Not on file   Highest education level: Bachelor's degree (e.g., BA, AB, BS)  Occupational History   Not on file  Tobacco Use    Smoking status: Former    Current packs/day: 0.00    Average packs/day: 3.0 packs/day for 26.0 years (78.0 ttl pk-yrs)    Types: Cigarettes    Start date: 61    Quit date: 21    Years since quitting: 35.3    Passive exposure: Current (Husband smokes cigars)   Smokeless tobacco: Never  Vaping Use   Vaping status: Never Used  Substance and Sexual Activity   Alcohol  use: Not Currently    Comment: quit 1990   Drug use: Not Currently    Types: Marijuana   Sexual activity: Not Currently  Other Topics Concern   Not on file  Social History Narrative   From [place], first generation American, 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  Moved to [facility] in late 2010's.  Enjoys healthful living, Mediterranean diet, staying active.  First husband passed away due to complications from diabetes.  She has since remarried.   Social Drivers of Corporate investment banker Strain: Low Risk  (07/30/2023)   Overall Financial Resource Strain (CARDIA)    Difficulty of Paying Living Expenses: Not hard at all  Food Insecurity: No Food Insecurity (07/30/2023)   Hunger Vital Sign    Worried About Running Out of Food in the Last Year: Never true    Ran Out of Food in the Last Year: Never true  Transportation Needs: No Transportation Needs (07/30/2023)   PRAPARE - Administrator, Civil Service (Medical): No    Lack of Transportation (Non-Medical): No  Physical Activity: Sufficiently Active (07/30/2023)   Exercise Vital Sign    Days of Exercise per Week: 5 days    Minutes of Exercise per Session: 30 min  Stress: No Stress Concern Present (07/30/2023)   [employer] of Occupational Health - Occupational Stress Questionnaire    Feeling of Stress : Not at all  Social Connections: Socially Integrated (07/30/2023)   Social Connection and Isolation Panel [NHANES]    Frequency of Communication with Friends and Family: More than three times a week    Frequency of Social Gatherings with Friends and  Family: More than three times a week    Attends Religious Services: More than 4 times per year    Active Member of [employer] or Organizations: Yes    Attends Engineer, structural: More than 4 times per year    Marital Status: Married       Review of Systems denies fever, headache, chest pain, dyspnea, abdominal pain, nausea, vomiting or bleeding.  She does have occasional cough, and chronic back pain  Vital Signs: Vitals:   09/21/23 0747  BP: 124/64  Pulse: 62  Resp: 16  Temp: 97.9 F (36.6 C)  SpO2: 98%       Advance Care Plan: no documents on file  Physical Exam awake, alert.  Chest clear to auscultation bilaterally.  Heart with regular rate and rhythm.  Abdomen soft, positive bowel sounds, nontender.  No lower extremity edema.  Imaging: No results found.  Labs:  CBC: Recent Labs    12/02/22 1051 05/01/23 1034 07/20/23 1432  WBC 6.3 6.1 5.6  HGB 12.9 14.1 13.5  HCT 39.5 43.6 40.3  PLT 233 252 230    COAGS: No results for input(s): "INR", "APTT" in the last 8760 hours.  BMP: Recent Labs    05/01/23 1034 06/05/23 1217 07/07/23 1011 07/20/23 1432  NA 141 139 139 136  K 4.8 4.6 4.4 4.7  CL 109 105 104 104  CO2 24 23 21 28   GLUCOSE 76 87 81 86  BUN 17 24 18 19   CALCIUM  9.6 9.6 9.3 9.2  CREATININE 0.99 1.07* 0.93 1.00  GFRNONAA  --   --   --  58*    LIVER FUNCTION TESTS: Recent Labs    05/01/23 1034 06/05/23 1217 07/07/23 1012 07/20/23 1432  BILITOT 0.4 0.4 0.4 0.5  AST 19 17 20 26   ALT 15 11 12 18   ALKPHOS  --   --  62 56  PROT 6.2 5.8*  5.7* 5.7* 6.5  ALBUMIN  --   --  3.9 4.2    TUMOR MARKERS: No results for input(s): "AFPTM", "CEA", "CA199", "CHROMGRNA" in the last 8760 hours.  Assessment and Plan: 78 y.o. female ex smoker with PMH sig for depression, RA, HLD, thoracic AAA who presents now with IgG kappa monoclonal gammopathy, positive M spike. She is scheduled today for CT guided bone marrow biopsy to rule out myeloma.Risks  and benefits of procedure was discussed with the patient including, but not limited to bleeding, infection, damage to adjacent structures or low yield requiring additional tests.  All of the questions were answered and there is agreement to proceed.  Consent signed and in chart.    Thank you for allowing our service to participate in Bexlee Bergdoll 's care.  Electronically Signed: D. Honore Lux, PA-C   09/18/2023, 1:59 PM      I spent a total of  15 Minutes   in face to face in clinical consultation, greater than 50% of which was counseling/coordinating care for CT guided bone marrow biopsy

## 2023-09-21 ENCOUNTER — Ambulatory Visit (HOSPITAL_COMMUNITY)
Admission: RE | Admit: 2023-09-21 | Discharge: 2023-09-21 | Disposition: A | Discharge status: Home / Self Care | Source: Ambulatory Visit | Attending: Hematology and Oncology | Admitting: Hematology and Oncology

## 2023-09-21 ENCOUNTER — Other Ambulatory Visit: Payer: Self-pay

## 2023-09-21 DIAGNOSIS — Z87891 Personal history of nicotine dependence: Secondary | ICD-10-CM | POA: Insufficient documentation

## 2023-09-21 DIAGNOSIS — D472 Monoclonal gammopathy: Secondary | ICD-10-CM | POA: Insufficient documentation

## 2023-09-21 DIAGNOSIS — F32A Depression, unspecified: Secondary | ICD-10-CM | POA: Insufficient documentation

## 2023-09-21 DIAGNOSIS — Z1379 Encounter for other screening for genetic and chromosomal anomalies: Secondary | ICD-10-CM | POA: Insufficient documentation

## 2023-09-21 DIAGNOSIS — E785 Hyperlipidemia, unspecified: Secondary | ICD-10-CM | POA: Diagnosis not present

## 2023-09-21 DIAGNOSIS — I7121 Aneurysm of the ascending aorta, without rupture: Secondary | ICD-10-CM | POA: Insufficient documentation

## 2023-09-21 DIAGNOSIS — Z79620 Long term (current) use of immunosuppressive biologic: Secondary | ICD-10-CM | POA: Insufficient documentation

## 2023-09-21 DIAGNOSIS — Z79899 Other long term (current) drug therapy: Secondary | ICD-10-CM | POA: Diagnosis not present

## 2023-09-21 DIAGNOSIS — M069 Rheumatoid arthritis, unspecified: Secondary | ICD-10-CM | POA: Insufficient documentation

## 2023-09-21 HISTORY — PX: BONE BIOPSY: SHX375

## 2023-09-21 LAB — CBC WITH DIFFERENTIAL/PLATELET
Abs Immature Granulocytes: 0.04 K/uL (ref 0.00–0.07)
Basophils Absolute: 0 K/uL (ref 0.0–0.1)
Basophils Relative: 0 %
Eosinophils Absolute: 0.2 K/uL (ref 0.0–0.5)
Eosinophils Relative: 4 %
HCT: 41.4 % (ref 36.0–46.0)
Hemoglobin: 13.3 g/dL (ref 12.0–15.0)
Immature Granulocytes: 1 %
Lymphocytes Relative: 24 %
Lymphs Abs: 1.3 K/uL (ref 0.7–4.0)
MCH: 30.1 pg (ref 26.0–34.0)
MCHC: 32.1 g/dL (ref 30.0–36.0)
MCV: 93.7 fL (ref 80.0–100.0)
Monocytes Absolute: 0.5 K/uL (ref 0.1–1.0)
Monocytes Relative: 9 %
Neutro Abs: 3.3 K/uL (ref 1.7–7.7)
Neutrophils Relative %: 62 %
Platelets: 217 K/uL (ref 150–400)
RBC: 4.42 MIL/uL (ref 3.87–5.11)
RDW: 11.9 % (ref 11.5–15.5)
WBC: 5.3 K/uL (ref 4.0–10.5)
nRBC: 0 % (ref 0.0–0.2)

## 2023-09-21 MED ORDER — MIDAZOLAM HCL 2 MG/2ML IJ SOLN
INTRAMUSCULAR | Status: AC
  Filled 2023-09-21: qty 2

## 2023-09-21 MED ORDER — FENTANYL CITRATE (PF) 100 MCG/2ML IJ SOLN
INTRAMUSCULAR | Status: AC
  Filled 2023-09-21: qty 2

## 2023-09-21 MED ORDER — FENTANYL CITRATE (PF) 100 MCG/2ML IJ SOLN
INTRAMUSCULAR | Status: AC | PRN
  Administered 2023-09-21: 50 ug via INTRAVENOUS

## 2023-09-21 MED ORDER — SODIUM CHLORIDE 0.9 % IV SOLN: INTRAVENOUS | Status: DC

## 2023-09-21 MED ORDER — MIDAZOLAM HCL 5 MG/5ML IJ SOLN
INTRAMUSCULAR | Status: AC | PRN
  Administered 2023-09-21: 1 mg via INTRAVENOUS

## 2023-09-21 NOTE — Sedation Documentation (Signed)
 Wasted 1mg  of Versed  and 50 mcg of Fentanyl  with Ardath Koh RN in IR rm Pyxis

## 2023-09-21 NOTE — Discharge Instructions (Signed)
 For questions /concerns may call Interventional Radiology at [phone] or  Interventional Radiology clinic [phone]   You may remove your dressing and shower tomorrow afternoon                                              Moderate Conscious Sedation, Adult, Care After After the procedure, it is common to have: Sleepiness for a few hours. Impaired judgment for a few hours. Trouble with balance. Nausea or vomiting if you eat too soon. Follow these instructions at home: For the time period you were told by your health care provider:  Rest. Do not participate in activities where you could fall or become injured. Do not drive or use machinery. Do not drink alcohol. Do not take sleeping pills or medicines that cause drowsiness. Do not make important decisions or sign legal documents. Do not take care of children on your own. Eating and drinking Follow instructions from your health care provider about what you may eat and drink. Drink enough fluid to keep your urine pale yellow. If you vomit: Drink clear fluids slowly and in small amounts as you are able. Clear fluids include water, ice chips, low-calorie sports drinks, and fruit juice that has water added to it (diluted fruit juice). Eat light and bland foods in small amounts as you are able. These foods include bananas, applesauce, rice, lean meats, toast, and crackers. General instructions Take over-the-counter and prescription medicines only as told by your health care provider. Have a responsible adult stay with you for the time you are told. Do not use any products that contain nicotine or tobacco. These products include cigarettes, chewing tobacco, and vaping devices, such as e-cigarettes. If you need help quitting, ask your health care provider. Return to your normal activities as told by your health care provider. Ask your health care provider what activities are safe for you. Your health care provider may give you more  instructions. Make sure you know what you can and cannot do. Contact a health care provider if: You are still sleepy or having trouble with balance after 24 hours. You feel light-headed. You vomit every time you eat or drink. You get a rash. You have a fever. You have redness or swelling around the IV site. Get help right away if: You have trouble breathing. You start to feel confused at home. These symptoms may be an emergency. Get help right away. Call 911. Do not wait to see if the symptoms will go away. Do not drive yourself to the hospital. This information is not intended to replace advice given to you by your health care provider. Make sure you discuss any questions you have with your health care provider. Document Revised: 11/04/2021 Document Reviewed: 11/04/2021 Elsevier Patient Education  2024 Elsevier Inc.                                                         Bone Marrow Aspiration and Bone Marrow Biopsy, Adult, Care After The following information offers guidance on how to care for yourself after your procedure. Your health care provider may also give you more specific instructions. If you have problems or questions, contact your health care provider.  What can I expect after the procedure? After the procedure, it is common to have: Mild pain and tenderness. Swelling. Bruising. Follow these instructions at home: Incision care  Follow instructions from your health care provider about how to take care of the incision site. Make sure you: Wash your hands with soap and water for at least 20 seconds before and after you change your bandage (dressing). If soap and water are not available, use hand sanitizer. Change your dressing as told by your health care provider. Leave stitches (sutures), skin glue, or adhesive strips in place. These skin closures may need to stay in place for 2 weeks or longer. If adhesive strip edges start to loosen and curl up, you may trim the loose  edges. Do not remove adhesive strips completely unless your health care provider tells you to do that. Check your incision site every day for signs of infection. Check for: More redness, swelling, or pain. Fluid or blood. Warmth. Pus or a bad smell. Activity Return to your normal activities as told by your health care provider. Ask your health care provider what activities are safe for you. Do not lift anything that is heavier than 10 lb (4.5 kg), or the limit that you are told, until your health care provider says that it is safe. If you were given a sedative during the procedure, it can affect you for several hours. Do not drive or operate machinery until your health care provider says that it is safe. General instructions  Take over-the-counter and prescription medicines only as told by your health care provider. Do not take baths, swim, or use a hot tub until your health care provider approves. Ask your health care provider if you may take showers. You may only be allowed to take sponge baths. If directed, put ice on the affected area. To do this: Put ice in a plastic bag. Place a towel between your skin and the bag. Leave the ice on for 20 minutes, 2-3 times a day. If your skin turns bright red, remove the ice right away to prevent skin damage. The risk of skin damage is higher if you cannot feel pain, heat, or cold. Contact a health care provider if: You have signs of infection. Your pain is not controlled with medicine. You have cancer, and a temperature of 100.46F (38C) or higher. Get help right away if: You have a temperature of 101F (38.3C) or higher, or as told by your health care provider. You have bleeding from the incision site that cannot be controlled. This information is not intended to replace advice given to you by your health care provider. Make sure you discuss any questions you have with your health care provider. Document Revised: 08/26/2021 Document Reviewed:  08/26/2021 Elsevier Patient Education  2024 [employer].

## 2023-09-21 NOTE — Procedures (Signed)
 Vascular and Interventional Radiology Procedure Note  Patient: Kristen Jacobs DOB: [birth year] Medical Record Number: [id number] Note Date/Time: 09/21/23 8:59 AM   Performing Physician: Art Largo, MD Assistant(s): None  Diagnosis: Q MGUS / MM   Procedure: BONE MARROW ASPIRATION and BIOPSY  Anesthesia: Conscious Sedation Complications: None Estimated Blood Loss: Minimal Specimens: Sent for Pathology  Findings:  Successful CT-guided bone marrow aspiration and biopsy A total of 1 cores were obtained. Hemostasis of the tract was achieved using Manual Pressure.  Plan: Bed rest for 1 hours.  See detailed procedure note with images in PACS. The patient tolerated the procedure well without incident or complication and was returned to Recovery in stable condition.    Art Largo, MD [facility] Interventional Radiology [facility] Radiology   Pager. [phone] Clinic. [phone]

## 2023-09-22 ENCOUNTER — Ambulatory Visit: Attending: Physician Assistant | Admitting: Physician Assistant

## 2023-09-22 ENCOUNTER — Encounter: Payer: Self-pay | Admitting: Physician Assistant

## 2023-09-22 VITALS — BP 94/54 | HR 62 | Resp 13 | Ht 65.5 in | Wt 141.6 lb

## 2023-09-22 DIAGNOSIS — M79642 Pain in left hand: Secondary | ICD-10-CM

## 2023-09-22 DIAGNOSIS — M79641 Pain in right hand: Secondary | ICD-10-CM | POA: Diagnosis not present

## 2023-09-22 DIAGNOSIS — M19071 Primary osteoarthritis, right ankle and foot: Secondary | ICD-10-CM

## 2023-09-22 DIAGNOSIS — D472 Monoclonal gammopathy: Secondary | ICD-10-CM

## 2023-09-22 DIAGNOSIS — M818 Other osteoporosis without current pathological fracture: Secondary | ICD-10-CM

## 2023-09-22 DIAGNOSIS — M0579 Rheumatoid arthritis with rheumatoid factor of multiple sites without organ or systems involvement: Secondary | ICD-10-CM

## 2023-09-22 DIAGNOSIS — M19072 Primary osteoarthritis, left ankle and foot: Secondary | ICD-10-CM

## 2023-09-22 DIAGNOSIS — M5030 Other cervical disc degeneration, unspecified cervical region: Secondary | ICD-10-CM

## 2023-09-22 DIAGNOSIS — R5383 Other fatigue: Secondary | ICD-10-CM

## 2023-09-22 DIAGNOSIS — Z79899 Other long term (current) drug therapy: Secondary | ICD-10-CM

## 2023-09-22 DIAGNOSIS — F419 Anxiety disorder, unspecified: Secondary | ICD-10-CM

## 2023-09-22 DIAGNOSIS — E559 Vitamin D deficiency, unspecified: Secondary | ICD-10-CM

## 2023-09-22 DIAGNOSIS — S92155D Nondisplaced avulsion fracture (chip fracture) of left talus, subsequent encounter for fracture with routine healing: Secondary | ICD-10-CM

## 2023-09-22 DIAGNOSIS — Z8619 Personal history of other infectious and parasitic diseases: Secondary | ICD-10-CM

## 2023-09-22 DIAGNOSIS — F5101 Primary insomnia: Secondary | ICD-10-CM

## 2023-09-22 DIAGNOSIS — M1712 Unilateral primary osteoarthritis, left knee: Secondary | ICD-10-CM

## 2023-09-22 MED ORDER — HYDROXYCHLOROQUINE SULFATE 200 MG PO TABS: ORAL_TABLET | ORAL | 0 refills | Status: DC

## 2023-09-23 LAB — SURGICAL PATHOLOGY

## 2023-09-24 ENCOUNTER — Ambulatory Visit: Payer: Medicare Other | Admitting: Rheumatology

## 2023-09-25 ENCOUNTER — Ambulatory Visit

## 2023-09-25 ENCOUNTER — Ambulatory Visit: Admitting: Sports Medicine

## 2023-09-25 DIAGNOSIS — M1712 Unilateral primary osteoarthritis, left knee: Secondary | ICD-10-CM

## 2023-09-25 DIAGNOSIS — M47816 Spondylosis without myelopathy or radiculopathy, lumbar region: Secondary | ICD-10-CM | POA: Diagnosis not present

## 2023-09-25 DIAGNOSIS — M4722 Other spondylosis with radiculopathy, cervical region: Secondary | ICD-10-CM

## 2023-09-25 MED ORDER — PREDNISONE 50 MG PO TABS: ORAL_TABLET | ORAL | 0 refills | Status: DC
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
## 2023-09-25 NOTE — Assessment & Plan Note (Signed)
 Pleasant 78 year old female, known lumbar spondylosis, we last treated this in 2024, she responded to conservative treatment including some therapy. Now with recurrence of discomfort radiating down the left leg to the back of the calf but not quite to the foot, better with flexion we will restart. I do suspect she has lumbar spinal stenosis We will get updated x-rays, adding some physical therapy close to where she lives in [place], 5 days of prednisone , she declines neuropathic medications, return in 6 weeks, MR for interventional planning if not better.

## 2023-09-30 ENCOUNTER — Encounter (HOSPITAL_COMMUNITY): Payer: Self-pay | Admitting: Hematology and Oncology

## 2023-09-30 ENCOUNTER — Telehealth: Payer: Self-pay | Admitting: *Deleted

## 2023-09-30 NOTE — Telephone Encounter (Signed)
 TCT patient regarding recent bone marrow biopsy results. Spoke with her. Advised that her Bone marrow biopsy showed 4% abnormal cells. This, combined with her labs, is most consistent with MGUS, per Dr. Rosaline Coma. Advised that she does not have cancer. But aht we need to see her and chaeck her labs every 6 months. Advised that out scheduler will call her with her next appt date and time. Pt voiced understanding.

## 2023-10-01 NOTE — Telephone Encounter (Signed)
 Please see message below for an [facility] Appointment request.  Copied from CRM [id number]. Topic: Appointments - Appointment Scheduling >> Sep 30, 2023 11:56 AM Kristen Jacobs wrote: Patient is calling because she is needing an appointment for her annual physical, patient callback is [phone].

## 2023-10-02 ENCOUNTER — Encounter: Payer: Self-pay | Admitting: Sports Medicine

## 2023-10-02 NOTE — Telephone Encounter (Signed)
Done, Thanks.

## 2023-10-06 ENCOUNTER — Ambulatory Visit: Payer: Self-pay | Admitting: Sports Medicine

## 2023-10-06 NOTE — Telephone Encounter (Signed)
 Patient has been scheduled to come in on 11/05/23.

## 2023-10-06 NOTE — Telephone Encounter (Signed)
 Excellent thank you!!!

## 2023-10-13 ENCOUNTER — Ambulatory Visit

## 2023-10-13 DIAGNOSIS — M4722 Other spondylosis with radiculopathy, cervical region: Secondary | ICD-10-CM

## 2023-10-21 ENCOUNTER — Other Ambulatory Visit: Payer: Self-pay | Admitting: *Deleted

## 2023-10-21 DIAGNOSIS — Z79899 Other long term (current) drug therapy: Secondary | ICD-10-CM

## 2023-10-22 ENCOUNTER — Ambulatory Visit: Payer: Self-pay | Admitting: Physician Assistant

## 2023-10-22 LAB — COMPREHENSIVE METABOLIC PANEL WITH GFR
ALT: 14 IU/L (ref 0–32)
AST: 33 IU/L (ref 0–40)
Albumin: 3.8 g/dL (ref 3.8–4.8)
Alkaline Phosphatase: 58 IU/L (ref 44–121)
BUN/Creatinine Ratio: 21 (ref 12–28)
BUN: 23 mg/dL (ref 8–27)
Bilirubin Total: 0.4 mg/dL (ref 0.0–1.2)
CO2: 22 mmol/L (ref 20–29)
Calcium: 9.2 mg/dL (ref 8.7–10.3)
Chloride: 100 mmol/L (ref 96–106)
Creatinine, Ser: 1.07 mg/dL — ABNORMAL HIGH (ref 0.57–1.00)
Globulin, Total: 1.8 g/dL (ref 1.5–4.5)
Glucose: 93 mg/dL (ref 70–99)
Potassium: 4 mmol/L (ref 3.5–5.2)
Sodium: 137 mmol/L (ref 134–144)
Total Protein: 5.6 g/dL — ABNORMAL LOW (ref 6.0–8.5)
eGFR: 53 mL/min/{1.73_m2} — ABNORMAL LOW (ref >59)

## 2023-10-22 NOTE — Progress Notes (Signed)
 Creatinine is elevated-1.07 and GFR is low-53.  Please clarify if she has been taking any NSAIDs? Any other medication changes? Dehydrated?

## 2023-10-23 NOTE — Progress Notes (Signed)
 Patient can follow up with PCP for recheck

## 2023-11-04 NOTE — Progress Notes (Signed)
 78 y.o. Kristen Jacobs is here for routine follow-up.  Since last visit patient has seen Kristen Jacobs, rheumatologist, sports medicine physician, heme-oncologist:  Kristen Jacobs recommended lipitor (high LDLa) - she hasn't started (doeesn't want to be on something lifelong) Bone biopsy - ruled out MM, MGUS identified. Recent labs suggest dehydration - plan to repeat today.  Keeps hydrated (coconut water), urine pale. Drinks coffee with caffeine.  Not in sun or exposed to heat.  Discussed today: No vertigo recently. Sister just dxed with anal cancer - Kristen Jacobs requests referral for colonoscopy.  Experiencing breast pain, see documentation in problem list.  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   Fear of vaccinations 02/22/2021   History of malignant neoplasm of skin 11/22/2020   Age-related osteoporosis without fracture 11/22/2020   Major depression in remission (HCC) 11/22/2020   History of hepatitis C 04/05/2020   Cervical spondylosis with radiculopathy 02/21/2020   Rheumatoid factor positive 02/17/2020   Laryngopharyngeal reflux (LPR) 12/20/2019   Primary osteoarthritis of left knee 07/19/2019   Subacromial bursitis of left shoulder joint 07/19/2019   Bilateral wrist pain, 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 Swallow whole., Disp: , Rfl:    atorvastatin  (LIPITOR) 80 MG tablet, Take 1 tablet (80 mg total) by mouth daily., Disp: 90 tablet, Rfl: 3   b complex vitamins capsule, Take 1 capsule by mouth daily., Disp: , Rfl:    Cholecalciferol (VITAMIN D3 PO), Take 5,000 Units by mouth daily. With vitamin K, Disp: , Rfl:    citalopram  (CELEXA ) 20 MG tablet, Take 1 tablet (20 mg total) by mouth daily., Disp: 90 tablet, Rfl: 3   EPINEPHrine  (EPIPEN  2-PAK) 0.3 mg/0.3 mL IJ SOAJ injection, Inject 0.3 mg into the muscle as needed for anaphylaxis., Disp: 1 each, Rfl: 2   Glucosamine 500 MG CAPS, Take 1 capsule by mouth. (Wild Shrimp Glucosamine), Disp: , Rfl:    hydroxychloroquine  (PLAQUENIL ) 200 MG tablet, TAKE 1 TABLET BY MOUTH TWICE  DAILY MONDAY THROUGH FRIDAY  ONLY. NONE ON SATURDAY OR SUNDAY, Disp: 120 tablet, Rfl: 0   ketoconazole (NIZORAL) 2 % shampoo, Apply topically as needed., Disp: , Rfl:    methocarbamol  (ROBAXIN ) 500 MG tablet, Take 1 tablet (500 mg total) by mouth 3 (three) times daily. (Muscle relaxer), Disp: 90 tablet, Rfl: 3   MISC NATURAL PRODUCTS PO, Take 3 capsules by mouth daily. Bone Strength: Vitamin D3, Vitamin K1, Vitamin K2, Calcium , MAgnesium, Strontium, Vanadium, Disp: , Rfl:    Prasterone, DHEA, (DHEA PO), Take by mouth daily., Disp: , Rfl:    predniSONE  (DELTASONE ) 50 MG tablet, One tab PO daily for 5 days., Disp: 5 tablet, Rfl: 0   traMADol  (ULTRAM ) 50 MG tablet,  Take 1 tablet (50 mg total) by mouth every 12 (twelve) hours as needed., Disp: 60 tablet, Rfl: 3   traZODone  (DESYREL ) 100 MG tablet, Take 1 tablet (100 mg total) by mouth at bedtime., Disp: 90 tablet, Rfl: 3   UNABLE TO FIND, Med Name: bio identials, Disp: , Rfl:    Zinc 10 MG LOZG, Use as directed in the mouth or throat., Disp: , Rfl:   Functional Status: Very independent in all ADLs and IADLs, active  life.  Objective BP (!) 115/53 (BP Location: Right Arm, Patient Position: Sitting, Cuff Size: Small)   Pulse (!) 59   Temp 98 F (36.7 C) (Oral)   Ht 5' 5.5 (1.664 m)   Wt 143 lb 12.8 oz (65.2 kg)   SpO2 99%   BMI 23.57 kg/m   Exam: Youthful well appearing woman with normal habitus. Breast exam: Normal appearance of left and right breasts; no skin or nipple abnormalities.  Mild tenderness of scattered areas of thickened/ropy-textured tissue.    Addressed today:  Screening for colon cancer -     Ambulatory referral to Gastroenterology  Acute kidney injury (HCC) -     BMP8+Anion Gap  MGUS (monoclonal gammopathy of unknown significance) Assessment & Plan: New dx this Spring (2025) based on serologic and bone marrow bx findings.  Established with Dr. Federico who will monitor her Q6M.   Age-related osteoporosis without fracture Assessment & Plan: Check vit D today to ensure adequate supplementation. Prefers not to take bisphosphonate.   Completed DEXA 05/2023 upon recommendation of her alternative medicine practitioner: The BMD measured at Femur Total Right is 0.648 g/cm2 with a T-score of -2.9. This patient is considered osteoporotic according to World Health Organization [facility]) criteria. L2 & L3 were excluded due to degenerative changes. The quality of the exam is good. AP Spine L1-L4 (L2,L3) 06/05/2023 78.6 -2.2 0.902 g/cm2. DualFemur Total Mean 06/05/2023 78.6 -2.7 0.664 g/cm2   Aneurysm of ascending aorta without rupture [facility]) Assessment & Plan: Incidental finding; cardiology consultation 04/2023. Elevated Lpa, advised atorvastatin  80 mg (she has not taken and doesn't want to based on her reading about statins). Cardiology plan was to check for bicuspid AoV and assess aneurysm at 44m then determine frequency of monitoring.    Elevated lipoprotein A level Assessment & Plan: Has not started statin (80 mg atorva prescribed by Dr. Thukkani) as a matter of personal preference.    Burning sensation of feet Assessment & Plan: Occasional and not associated with loss of sensation/numbness.  She is concerned about B12 deficiency.  Will check level today.  Bilateral mastodynia Assessment & Plan: Scattered areas of tenderness which on exam are in areas of ropy thickening; query fibrocystic disease, order U/S. Dense breasts w/o abnormality on most recent screening mammogram 05/2022.   -     US  LIMITED ULTRASOUND INCLUDING AXILLA LEFT BREAST ; Future -     US  LIMITED ULTRASOUND INCLUDING AXILLA RIGHT BREAST; Future

## 2023-11-05 ENCOUNTER — Encounter: Payer: Self-pay | Admitting: Sports Medicine

## 2023-11-05 ENCOUNTER — Ambulatory Visit: Admitting: Internal Medicine

## 2023-11-05 ENCOUNTER — Ambulatory Visit: Admitting: Sports Medicine

## 2023-11-05 VITALS — BP 115/53 | HR 59 | Temp 98.0°F | Ht 65.5 in | Wt 143.8 lb

## 2023-11-05 DIAGNOSIS — N644 Mastodynia: Secondary | ICD-10-CM

## 2023-11-05 DIAGNOSIS — D472 Monoclonal gammopathy: Secondary | ICD-10-CM

## 2023-11-05 DIAGNOSIS — Z1211 Encounter for screening for malignant neoplasm of colon: Secondary | ICD-10-CM

## 2023-11-05 DIAGNOSIS — M818 Other osteoporosis without current pathological fracture: Secondary | ICD-10-CM

## 2023-11-05 DIAGNOSIS — E7841 Elevated Lipoprotein(a): Secondary | ICD-10-CM

## 2023-11-05 DIAGNOSIS — R208 Other disturbances of skin sensation: Secondary | ICD-10-CM

## 2023-11-05 DIAGNOSIS — M4722 Other spondylosis with radiculopathy, cervical region: Secondary | ICD-10-CM

## 2023-11-05 DIAGNOSIS — G629 Polyneuropathy, unspecified: Secondary | ICD-10-CM | POA: Diagnosis not present

## 2023-11-05 DIAGNOSIS — M47816 Spondylosis without myelopathy or radiculopathy, lumbar region: Secondary | ICD-10-CM | POA: Diagnosis not present

## 2023-11-05 DIAGNOSIS — N179 Acute kidney failure, unspecified: Secondary | ICD-10-CM

## 2023-11-05 DIAGNOSIS — I7121 Aneurysm of the ascending aorta, without rupture: Secondary | ICD-10-CM

## 2023-11-05 DIAGNOSIS — M810 Age-related osteoporosis without current pathological fracture: Secondary | ICD-10-CM

## 2023-11-05 NOTE — Progress Notes (Signed)
    Procedures performed today:    None.  Independent interpretation of notes and tests performed by another provider:   None.  Brief History, Exam, Impression, and Recommendations:    Lumbar spondylosis Please see prior notes for further details, axial low back pain, left leg, back of the calf, better with flexion. We obtained some x-rays. She has now done over 6 weeks of physical therapy, she is doing well, she still has some discomfort but it is tolerable at this juncture, she will do home conditioning and return to see me as needed. MRI and epidural would be the next step.  Cervical spondylosis with radiculopathy Also with known cervical spondylosis, MRI was done, as below she is doing really well with home conditioning, she will do this indefinitely and return to see me as needed.    ____________________________________________ Debby PARAS. Curtis, M.D., ABFM., CAQSM., AME. Primary Care and Sports Medicine [facility]  Adjunct Professor of [facility] Medicine  [employer]  Restaurant manager, fast food

## 2023-11-05 NOTE — Assessment & Plan Note (Signed)
 Also with known cervical spondylosis, MRI was done, as below she is doing really well with home conditioning, she will do this indefinitely and return to see me as needed.

## 2023-11-05 NOTE — Assessment & Plan Note (Signed)
 Please see prior notes for further details, axial low back pain, left leg, back of the calf, better with flexion. We obtained some x-rays. She has now done over 6 weeks of physical therapy, she is doing well, she still has some discomfort but it is tolerable at this juncture, she will do home conditioning and return to see me as needed. MRI and epidural would be the next step.

## 2023-11-06 ENCOUNTER — Encounter: Payer: Self-pay | Admitting: Sports Medicine

## 2023-11-06 LAB — BMP8+ANION GAP
Anion Gap: 15 mmol/L (ref 10.0–18.0)
BUN/Creatinine Ratio: 15 (ref 12–28)
BUN: 15 mg/dL (ref 8–27)
CO2: 19 mmol/L — ABNORMAL LOW (ref 20–29)
Calcium: 9.5 mg/dL (ref 8.7–10.3)
Chloride: 103 mmol/L (ref 96–106)
Creatinine, Ser: 1 mg/dL (ref 0.57–1.00)
Glucose: 89 mg/dL (ref 70–99)
Potassium: 4.7 mmol/L (ref 3.5–5.2)
Sodium: 137 mmol/L (ref 134–144)
eGFR: 57 mL/min/1.73 — ABNORMAL LOW (ref >59)

## 2023-11-06 LAB — VITAMIN B12: Vitamin B-12: 676 pg/mL (ref 232–1245)

## 2023-11-09 ENCOUNTER — Other Ambulatory Visit: Payer: Self-pay | Admitting: Internal Medicine

## 2023-11-09 DIAGNOSIS — N644 Mastodynia: Secondary | ICD-10-CM

## 2023-11-10 ENCOUNTER — Encounter: Payer: Self-pay | Admitting: Internal Medicine

## 2023-11-10 ENCOUNTER — Ambulatory Visit (HOSPITAL_COMMUNITY)
Admission: RE | Admit: 2023-11-10 | Discharge: 2023-11-10 | Disposition: A | Discharge status: Home / Self Care | Payer: Medicare Other | Source: Ambulatory Visit | Attending: Internal Medicine | Admitting: Internal Medicine

## 2023-11-10 DIAGNOSIS — N644 Mastodynia: Secondary | ICD-10-CM | POA: Insufficient documentation

## 2023-11-10 DIAGNOSIS — I7121 Aneurysm of the ascending aorta, without rupture: Secondary | ICD-10-CM | POA: Insufficient documentation

## 2023-11-10 DIAGNOSIS — D472 Monoclonal gammopathy: Secondary | ICD-10-CM | POA: Insufficient documentation

## 2023-11-10 DIAGNOSIS — E7841 Elevated Lipoprotein(a): Secondary | ICD-10-CM | POA: Insufficient documentation

## 2023-11-10 DIAGNOSIS — R208 Other disturbances of skin sensation: Secondary | ICD-10-CM | POA: Insufficient documentation

## 2023-11-10 MED ORDER — IOHEXOL 350 MG/ML SOLN
75.0000 mL | Freq: Once | INTRAVENOUS | Status: AC | PRN
  Administered 2023-11-10: 75 mL via INTRAVENOUS

## 2023-11-10 NOTE — Assessment & Plan Note (Signed)
 Scattered areas of tenderness which on exam are in areas of ropy thickening; query fibrocystic disease, order U/S. Dense breasts w/o abnormality on most recent screening mammogram 05/2022.

## 2023-11-10 NOTE — Assessment & Plan Note (Signed)
 Has not started statin (80 mg atorva prescribed by Dr. Thukkani) as a matter of personal preference.

## 2023-11-10 NOTE — Assessment & Plan Note (Signed)
 Occasional and not associated with loss of sensation/numbness.  She is concerned about B12 deficiency.  Will check level today.

## 2023-11-10 NOTE — Assessment & Plan Note (Addendum)
 Check vit D today to ensure adequate supplementation. Prefers not to take bisphosphonate.   Completed DEXA 05/2023: The BMD measured at Femur Total Right is 0.648 g/cm2 with a T-score of -2.9. This patient is considered osteoporotic according to World Health Organization [facility]) criteria. L2 & L3 were excluded due to degenerative changes. The quality of the exam is good. AP Spine L1-L4 (L2,L3) 06/05/2023 78.6 -2.2 0.902 g/cm2 DualFemur Total Mean 06/05/2023 78.6 -2.7 0.664 g/cm2

## 2023-11-10 NOTE — Assessment & Plan Note (Signed)
 Incidental finding; cardiology consultation 04/2023. Elevated Lpa, advised atorvastatin  80 mg (she has not taken and doesn't want to based on her reading about statins). Cardiology plan was to check for bicuspid AoV and assess aneurysm at 32m then determine frequency of monitoring.

## 2023-11-10 NOTE — Assessment & Plan Note (Signed)
 New dx this Spring (2025) based on serologic and bone marrow bx findings.  Established with Dr. Federico who will monitor her Q6M.

## 2023-11-11 ENCOUNTER — Ambulatory Visit: Payer: Self-pay | Admitting: Internal Medicine

## 2023-11-12 ENCOUNTER — Encounter: Payer: Self-pay | Admitting: Internal Medicine

## 2023-11-12 ENCOUNTER — Encounter: Payer: Self-pay | Admitting: Physician Assistant

## 2023-11-17 ENCOUNTER — Encounter: Payer: Self-pay | Admitting: Rheumatology

## 2023-11-18 NOTE — Telephone Encounter (Signed)
 Ok to continue lidocaine patch

## 2023-11-21 ENCOUNTER — Other Ambulatory Visit: Payer: Self-pay | Admitting: Physician Assistant

## 2023-11-24 ENCOUNTER — Inpatient Hospital Stay: Admission: RE | Admit: 2023-11-24 | Source: Ambulatory Visit

## 2023-11-24 ENCOUNTER — Encounter

## 2023-11-24 ENCOUNTER — Other Ambulatory Visit

## 2023-11-26 ENCOUNTER — Other Ambulatory Visit: Payer: Self-pay | Admitting: Internal Medicine

## 2023-11-26 ENCOUNTER — Telehealth: Payer: Self-pay | Admitting: *Deleted

## 2023-11-26 DIAGNOSIS — N644 Mastodynia: Secondary | ICD-10-CM

## 2023-11-26 NOTE — Telephone Encounter (Signed)
 Copied from CRM [id number]. Topic: General - Other >> Nov 26, 2023 12:02 PM Miquel SAILOR wrote: Reason for CRM: MM 3D DIAGNOSTIC MAMMOGRAM BILATERAL BREAST (Order [id number])- Patient request call back due to tried to schedule MAM and US  app but they said no order in system. Needs clarification on this [phone]

## 2023-11-26 NOTE — Telephone Encounter (Signed)
 Everything should be ok. I called the pt ; stated she will call today. And pt will call back if she has any other problems. Thanks

## 2023-11-26 NOTE — Telephone Encounter (Signed)
 Yes. Can you place a new order for diagnostic mammogram also?

## 2023-11-26 NOTE — Telephone Encounter (Signed)
 Pt stated the order for mammogram/u/s will expire before 8/13. [facility] who stated their first available appt is 8/13. And the current order will expire by this date. I asked if orders last for 1 year; she stated some only last 30 days. She stated they need new orders. I called pt back to inform her - stated she needs the orders as soon as possible or she will loose the appt on the 13th. Sending to her doctor now.

## 2023-12-04 NOTE — Progress Notes (Signed)
 Cardiology Office Note:   Date:  12/07/2023  ID:  Kristen Jacobs, DOB 1946/05/02, MRN [id number] PCP:  Trudy Mliss Dragon, MD  [facility] Providers Cardiologist:  Wendel Haws, MD Referring MD: Trudy Mliss Dragon, MD  Chief Complaint/Reason for Referral: Ascending aortic aneurysm and aortic atherosclerosis ASSESSMENT:    1. Aortic dilatation (HCC)   2. Aortic atherosclerosis (HCC)   3. Hyperlipidemia LDL goal <55   4. Elevated lipoprotein(a)   5. CKD (chronic kidney disease) stage 2, GFR 60-89 ml/min   6. Rheumatoid arthritis with positive rheumatoid factor, involving unspecified site [facility])      PLAN:   In order of problems listed above: Aortic dilatation: No aneurysm seen on most recent CT scan; no need for continued surveillance. Aortic atherosclerosis: Continue aspirin  81mg  and start Zetia  10 mg; patient declined statins Hyperlipidemia: Start Zetia  10mg , patient declines statins; check lipid panel in 2 months. CKD stage II: Monitor for now Rheumatoid arthritis: Continue Plaquenil  200mg  BID.    I spent 33 minutes reviewing all clinical data during and prior to this visit including all relevant imaging studies, laboratories, clinical information from other health systems and prior notes from both Cardiology and other specialties, interviewing the patient, conducting a complete physical examination, and coordinating care in order to formulate a comprehensive and personalized evaluation and treatment plan.       Dispo:  Return in about 6 months (around 06/08/2024).      Medication Adjustments/Labs and Tests Ordered: Current medicines are reviewed at length with the patient today.  Concerns regarding medicines are outlined above.  The following changes have been made:     Labs/tests ordered: Orders Placed This Encounter  Procedures   Lipid panel    Medication Changes: Meds ordered this encounter  Medications   ezetimibe  (ZETIA ) 10 MG tablet    Sig: Take 1  tablet (10 mg total) by mouth daily.    Dispense:  90 tablet    Refill:  1    Current medicines are reviewed at length with the patient today.  The patient does not have concerns regarding medicines.     History of Present Illness:      FOCUSED PROBLEM LIST:   Thoracic aortic aneurysm No family history of aortopathy 4 cm on chest CT September 2024 Tricuspid aortic valve, G1DD, EF 55-60% TTE 2025 No thoracic aneurysm CT July 2025 Aortic atherosclerosis Chest CT 2024 Hyperlipidemia LpA 131 Defers statins Rheumatoid arthritis On Plaquenil  BMI 22 CKD stage II  12/24: The patient is a 78 year old female with above listed medical problems who self-referred for recommendations regarding incidentally noted thoracic aortic aneurysm and aortic atherosclerosis on a chest CT done for lung cancer screening.  She does Pilates on a regular basis without any issues.  She denies any chest pain, or shortness of breath.  She denies any palpitations or paroxysmal atrial dyspnea.  She denies a family history of aortopathy or bicuspid aortic stenosis.  She used to smoke but this was several years ago.  She is happily retired.  She is otherwise well and without complaints.  Plan:  Obtain TTE to evaluate for bicuspid aortic valve, start ASA 81 and atorvastatin  20mg   August 2026:  Patient consents to use of AI scribe. The patient's TTE demonstrated a trileaflet valve and a preserved ejection fraction.  Her LpA was elevated and her atorvastatin  was intensified.  A repeat CT scan demonstrated no thoracic aortic aneurysm.  She was previously noted to have cholesterol deposits on the aorta, leading  to the initiation of aspirin  therapy. Her lipoprotein(a) level was found to be highly elevated at 131. Her last LDL cholesterol level was 98.  She has chosen not to take statins for cholesterol management.  No chest pain and her breathing is okay.  No other cardiovascular complaints.          Current  Medications: Current Meds  Medication Sig   Ascorbic Acid (VITAMIN C) 100 MG CHEW Chew by mouth.   aspirin  EC 81 MG tablet Take 1 tablet (81 mg total) by mouth daily. Swallow whole.   b complex vitamins capsule Take 1 capsule by mouth daily.   Cholecalciferol (VITAMIN D3 PO) Take 4,000 Units by mouth daily. With vitamin K   citalopram  (CELEXA ) 20 MG tablet Take 1 tablet (20 mg total) by mouth daily.   EPINEPHrine  (EPIPEN  2-PAK) 0.3 mg/0.3 mL IJ SOAJ injection Inject 0.3 mg into the muscle as needed for anaphylaxis.   ezetimibe  (ZETIA ) 10 MG tablet Take 1 tablet (10 mg total) by mouth daily.   Glucosamine 500 MG CAPS Take 1 capsule by mouth. (Wild Shrimp Glucosamine)   hydroxychloroquine  (PLAQUENIL ) 200 MG tablet TAKE 1 TABLET BY MOUTH TWICE  DAILY MONDAY THROUGH FRIDAY  ONLY. NONE ON SATURDAY OR SUNDAY   ketoconazole (NIZORAL) 2 % shampoo Apply topically as needed.   methocarbamol  (ROBAXIN ) 500 MG tablet Take 1 tablet (500 mg total) by mouth 3 (three) times daily. (Muscle relaxer)   MISC NATURAL PRODUCTS PO Take 3 capsules by mouth daily. Bone Strength: Vitamin D3, Vitamin K1, Vitamin K2, Calcium , MAgnesium, Strontium, Vanadium   NONFORMULARY OR COMPOUNDED ITEM Place 0.5 Troches under the tongue 2 (two) times daily. Progesterone  180 mg, tri-estrogen 1.75 mg, testosterone  1.2 mg, DHEA 5 mg troche [employer], [place]  Prescriber Edward Eckert   Prasterone, DHEA, (DHEA PO) Take by mouth daily.   traMADol  (ULTRAM ) 50 MG tablet Take 1 tablet (50 mg total) by mouth every 12 (twelve) hours as needed.   traZODone  (DESYREL ) 100 MG tablet Take 1 tablet (100 mg total) by mouth at bedtime.   Zinc 10 MG LOZG Use as directed in the mouth or throat.     Review of Systems:   Please see the history of present illness.    All other systems reviewed and are negative.     EKGs/Labs/Other Test Reviewed:   EKG: 2024 normal sinus rhythm  EKG Interpretation Date/Time:    Ventricular Rate:     PR Interval:    QRS Duration:    QT Interval:    QTC Calculation:   R Axis:      Text Interpretation:           Risk Assessment/Calculations:          Physical Exam:   VS:  BP (!) 104/54   Pulse 65   Ht 5' 5.5 (1.664 m)   Wt 140 lb (63.5 kg)   SpO2 98%   BMI 22.94 kg/m        Wt Readings from Last 3 Encounters:  12/07/23 140 lb (63.5 kg)  11/05/23 143 lb 12.8 oz (65.2 kg)  09/22/23 141 lb 9.6 oz (64.2 kg)      GENERAL:  No apparent distress, AOx3 HEENT:  No carotid bruits, +2 carotid impulses, no scleral icterus CAR: RRR no murmurs, gallops, rubs, or thrills RES:  Clear to auscultation bilaterally ABD:  Soft, nontender, nondistended, positive bowel sounds x 4 VASC:  +2 radial pulses, +2 carotid pulses NEURO:  CN 2-12 grossly intact; motor and sensory grossly intact PSYCH:  No active depression or anxiety EXT:  No edema, ecchymosis, or cyanosis  Signed, Dmauri Rosenow K Ellesse Antenucci, MD  12/07/2023 3:17 PM    [facility] [address] [place], [place], [place]  [address] Phone: [phone]; Fax: [phone]   Note:  This document was prepared using Dragon voice recognition software and may include unintentional dictation errors.

## 2023-12-07 ENCOUNTER — Encounter: Payer: Self-pay | Admitting: Internal Medicine

## 2023-12-07 ENCOUNTER — Ambulatory Visit: Attending: Internal Medicine | Admitting: Internal Medicine

## 2023-12-07 VITALS — BP 104/54 | HR 65 | Ht 65.5 in | Wt 140.0 lb

## 2023-12-07 DIAGNOSIS — E7841 Elevated Lipoprotein(a): Secondary | ICD-10-CM

## 2023-12-07 DIAGNOSIS — M059 Rheumatoid arthritis with rheumatoid factor, unspecified: Secondary | ICD-10-CM

## 2023-12-07 DIAGNOSIS — E785 Hyperlipidemia, unspecified: Secondary | ICD-10-CM

## 2023-12-07 DIAGNOSIS — I77819 Aortic ectasia, unspecified site: Secondary | ICD-10-CM | POA: Diagnosis not present

## 2023-12-07 DIAGNOSIS — I700 Atherosclerosis of aorta: Secondary | ICD-10-CM

## 2023-12-07 DIAGNOSIS — N182 Chronic kidney disease, stage 2 (mild): Secondary | ICD-10-CM

## 2023-12-07 MED ORDER — EZETIMIBE 10 MG PO TABS: 10.0000 mg | ORAL_TABLET | Freq: Every day | ORAL | 1 refills | Status: AC

## 2023-12-07 NOTE — Patient Instructions (Addendum)
 Medication Instructions:  START Zetia  10mg  Take 1 tablet once a day  *If you need a refill on your cardiac medications before your next appointment, please call your pharmacy*  Lab Work: FASTING LIPIDS IN 2 MONTHS   If you have labs (blood work) drawn today and your tests are completely normal, you will receive your results only by: MyChart Message (if you have MyChart) OR A paper copy in the mail If you have any lab test that is abnormal or we need to change your treatment, we will call you to review the results.  Testing/Procedures: NONE ORDERED  Follow-Up: At [facility], you and your health needs are our priority.  As part of our continuing mission to provide you with exceptional heart care, our providers are all part of one team.  This team includes your primary Cardiologist (physician) and Advanced Practice Providers or APPs (Physician Assistants and Nurse Practitioners) who all work together to provide you with the care you need, when you need it.  Your next appointment:   6 month(s)  Provider:   Glendia Ferrier, PA    We recommend signing up for the patient portal called MyChart.  Sign up information is provided on this After Visit Summary.  MyChart is used to connect with patients for Virtual Visits (Telemedicine).  Patients are able to view lab/test results, encounter notes, upcoming appointments, etc.  Non-urgent messages can be sent to your provider as well.   To learn more about what you can do with MyChart, go to [email or web address].   Other Instructions

## 2023-12-16 ENCOUNTER — Ambulatory Visit
Admission: RE | Admit: 2023-12-16 | Discharge: 2023-12-16 | Disposition: A | Discharge status: Home / Self Care | Source: Ambulatory Visit | Attending: Internal Medicine | Admitting: Internal Medicine

## 2023-12-16 ENCOUNTER — Ambulatory Visit

## 2023-12-16 ENCOUNTER — Ambulatory Visit: Admission: RE | Admit: 2023-12-16 | Source: Ambulatory Visit

## 2023-12-16 DIAGNOSIS — N644 Mastodynia: Secondary | ICD-10-CM

## 2023-12-23 ENCOUNTER — Encounter: Payer: Self-pay | Admitting: Internal Medicine

## 2023-12-23 NOTE — Telephone Encounter (Signed)
 Plaquenil  does not cause CKD but the dose will need to be adjusted if the GFR drops <30

## 2023-12-31 ENCOUNTER — Other Ambulatory Visit: Payer: Self-pay | Admitting: *Deleted

## 2023-12-31 DIAGNOSIS — F5101 Primary insomnia: Secondary | ICD-10-CM

## 2023-12-31 DIAGNOSIS — F419 Anxiety disorder, unspecified: Secondary | ICD-10-CM

## 2024-01-01 ENCOUNTER — Other Ambulatory Visit: Payer: Self-pay | Admitting: Internal Medicine

## 2024-01-01 ENCOUNTER — Telehealth: Payer: Self-pay | Admitting: Internal Medicine

## 2024-01-01 DIAGNOSIS — K648 Other hemorrhoids: Secondary | ICD-10-CM

## 2024-01-01 DIAGNOSIS — F5101 Primary insomnia: Secondary | ICD-10-CM

## 2024-01-01 MED ORDER — TRAZODONE HCL 100 MG PO TABS: 100.0000 mg | ORAL_TABLET | Freq: Every day | ORAL | 3 refills | Status: DC

## 2024-01-01 NOTE — Telephone Encounter (Signed)
 Called Kristen Jacobs to discuss her diagnosis of CKD, which is late 2/early 3, relatively stable over past couple years.  She had inquired whether a referral to nephrology was recommended.  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 She is careful about hydration.  She has chronic low BP which is asymptomatic.  She is very active. We discussed how kidney function declines to some degree with age, and that we would continue to monitor her.  Possible tests might include UAC, renal US . She isn't interested in medications to slow progression of CKD such as SGLT2is and generally prefers nonpharmacologic approaches to conditions.  We agreed to discuss at next visit.  Also will refer to GI for bleeding internal hemorrhoids; she had originally been referred for complete colonoscopy but she would prefer to start with just the anorectal evaluation at this time.   Unrelated, we discussed her citalopram  which was started after 911; she is interested in tapering off, as her mood has bee stable and she feels that her anxiety was largely situational.

## 2024-01-05 ENCOUNTER — Other Ambulatory Visit: Payer: Self-pay | Admitting: *Deleted

## 2024-01-05 ENCOUNTER — Encounter: Payer: Self-pay | Admitting: Sports Medicine

## 2024-01-05 DIAGNOSIS — F5101 Primary insomnia: Secondary | ICD-10-CM

## 2024-01-05 NOTE — Telephone Encounter (Signed)
 Received faxed refill request from pt's pharmacy for desyrel  100mg  tab take one tablet at bedtime and for celexa  20mg  tabs take one daily.  Celexa  no longer on medication list. Will send to pcp for review.Heber Hoog Cassady9/2/20252:06 PM

## 2024-01-05 NOTE — Progress Notes (Deleted)
 Ellouise Console, PA-C [address] [place], [place]  [address] Phone: [phone]   Gastroenterology Consultation  Referring Provider:     Trudy Mliss Dragon, MD Primary Care Physician:  Trudy Mliss Dragon, MD Primary Gastroenterologist:  Ellouise Console, PA-C / *** Reason for Consultation:     Discuss colonoscopy        HPI:   Kristen Jacobs is a 78 y.o. y/o female referred for consultation & management  by Trudy Mliss Dragon, MD.    New patient.  Here to discuss scheduling a screening colonoscopy.  She has never had a colonoscopy. Family history of colon cancer?  Current symptoms:  09/2023 normal CBC with Hgb 13.3.  No anemia.  PMH: Aortic atherosclerosis, hyperlipidemia, CKD stage II, rheumatoid arthritis, GERD, constipation.  Patient last saw cardiologist Dr. Wendel 04/2023.   Echo done 05/2023 showed normal LVEF 55 to 60%.  Mild aortic calcification but no stenosis.  11/2023 CT angio chest showed NO evidence of thoracic aortic aneurysm.  Past Medical History:  Diagnosis Date   Avulsion fracture of left talus 09/05/2019   Bilateral foot pain    Constipation    COVID-19 virus infection 01/30/2022   Depression    Forgetfulness    Hepatitis    History of UTI 11/22/2020   Recent event, fatigue w/o dysuria, symptoms resolved with antibiotic (name not recalled).  Urine POC dipstick normal today.    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 Authorizing Provider  Ascorbic Acid (VITAMIN C) 100 MG CHEW Chew by mouth.    [provider]  aspirin  EC 81 MG tablet Take 1 tablet (81 mg total) by mouth daily. Swallow whole. 04/14/23   Thukkani, Arun K, MD  b complex vitamins capsule Take 1 capsule by mouth daily.    [provider]  Cholecalciferol (VITAMIN D3 PO) Take 4,000 Units by mouth daily. With vitamin K    [provider]  EPINEPHrine  (EPIPEN  2-PAK) 0.3 mg/0.3 mL IJ SOAJ injection Inject 0.3 mg into the muscle as needed for anaphylaxis. 11/11/22   Trudy Mliss Dragon, MD  ezetimibe  (ZETIA ) 10 MG tablet Take 1 tablet (10 mg total) by mouth daily. 12/07/23 03/06/24  Thukkani, Arun K, MD  Glucosamine 500 MG CAPS Take 1 capsule by mouth. (Wild Shrimp Glucosamine)    [provider]  hydroxychloroquine  (PLAQUENIL ) 200 MG tablet TAKE 1 TABLET BY MOUTH TWICE  DAILY MONDAY THROUGH FRIDAY  ONLY. NONE ON SATURDAY OR SUNDAY 09/22/23   Cheryl Waddell HERO, PA-C  ketoconazole (NIZORAL) 2 % shampoo Apply topically as needed. 04/20/22   [provider]  methocarbamol  (ROBAXIN ) 500 MG tablet Take 1 tablet (500 mg total) by mouth 3 (three) times daily. (Muscle relaxer) 07/31/23   Curtis Debby PARAS, MD  MISC NATURAL PRODUCTS PO Take 3 capsules by mouth daily. Bone Strength: Vitamin D3, Vitamin K1, Vitamin K2, Calcium , MAgnesium, Strontium, Vanadium    [provider]  NONFORMULARY OR COMPOUNDED ITEM Place 0.5 Troches under the tongue 2 (two) times daily. Progesterone  180 mg, tri-estrogen 1.75 mg, testosterone  1.2 mg, DHEA 5 mg troche [employer], [place]  Prescriber Dallas Purple    [provider]  Prasterone, DHEA, (DHEA PO) Take by mouth daily.    [provider]  traMADol  (ULTRAM ) 50 MG tablet Take 1 tablet (50 mg total) by mouth every 12 (twelve) hours as needed. 07/31/23   Curtis Debby PARAS, MD  traZODone   (DESYREL ) 100 MG tablet Take 1 tablet (100 mg total) by mouth at bedtime. 01/01/24   Trudy Mliss Dragon, MD  Zinc 10 MG LOZG Use as directed in the mouth or throat.    [provider]    Family History  Problem Relation Age of Onset   Dementia Mother    Cancer - Lung Father    Bipolar disorder Sister    Rectal cancer Sister    Leukemia Brother    Healthy Son    Asthma Son    Healthy Son    Breast cancer Neg Hx      Social History   Tobacco Use   Smoking status: Former    Current packs/day: 0.00    Average packs/day: 3.0 packs/day for 26.0 years (78.0 ttl pk-yrs)    Types: Cigarettes    Start date: 1964    Quit date: 46    Years since quitting: 35.6    Passive exposure: Current (Husband smokes cigars)   Smokeless tobacco: Never  Vaping Use   Vaping status: Never Used  Substance Use Topics   Alcohol  use: Not Currently    Comment: quit 1990   Drug use: Not Currently    Types: Marijuana    Allergies as of 01/06/2024 - Review Complete 12/07/2023  Allergen Reaction Noted   Other  04/02/2020   Sulfa antibiotics  08/11/2017    Review of Systems:    All systems reviewed and negative except where noted in HPI.   Physical Exam:  There were no vitals taken for this visit. No LMP recorded. Patient is postmenopausal.  General:   Alert,  Well-developed, well-nourished, pleasant and cooperative in NAD Lungs:  Respirations even and unlabored.  Clear throughout to auscultation.   No wheezes, crackles, or rhonchi. No acute distress. Heart:  Regular rate and rhythm; no murmurs, clicks, rubs, or gallops. Abdomen:  Normal bowel sounds.  No bruits.  Soft, and non-distended without masses, hepatosplenomegaly or hernias noted.  No Tenderness.  No guarding or rebound tenderness.    Neurologic:  Alert and oriented x3;  grossly normal neurologically. Psych:  Alert and cooperative. Normal mood and affect.  Imaging Studies: MM 3D DIAGNOSTIC MAMMOGRAM BILATERAL BREAST Result  Date: 12/18/2023 CLINICAL DATA:  78 year old female complaining of diffuse bilateral breast pain. History of bilateral reduction mammoplasty. EXAM: DIGITAL DIAGNOSTIC BILATERAL MAMMOGRAM WITH TOMOSYNTHESIS AND CAD TECHNIQUE: Bilateral digital diagnostic mammography and breast tomosynthesis was performed. The images were evaluated with computer-aided detection. COMPARISON:  Previous exam(s). ACR Breast Density Category c: The breasts are heterogeneously dense, which may obscure small masses. FINDINGS: No suspicious mass or malignant type microcalcifications identified in either breast. IMPRESSION: No evidence of malignancy in either breast. RECOMMENDATION: Bilateral screening mammogram in 1 year is recommended. I have discussed the findings and recommendations with the patient. If applicable,  a reminder letter will be sent to the patient regarding the next appointment. BI-RADS CATEGORY  1: Negative. Electronically Signed   By: Dina  Arceo M.D.   On: 12/18/2023 05:03    Labs: CBC    Component Value Date/Time   WBC 5.3 09/21/2023 0800   RBC 4.42 09/21/2023 0800   HGB 13.3 09/21/2023 0800   HGB 13.5 07/20/2023 1432   HCT 41.4 09/21/2023 0800   PLT 217 09/21/2023 0800   PLT 230 07/20/2023 1432   MCV 93.7 09/21/2023 0800    CMP     Component Value Date/Time   NA 137 11/05/2023 1121   K 4.7 11/05/2023 1121   CL 103 11/05/2023 1121   CO2 19 (L) 11/05/2023 1121   GLUCOSE 89 11/05/2023 1121   GLUCOSE 86 07/20/2023 1432   BUN 15 11/05/2023 1121   CREATININE 1.00 11/05/2023 1121   CREATININE 1.00 07/20/2023 1432   CREATININE 1.07 (H) 06/05/2023 1217   CALCIUM  9.5 11/05/2023 1121   PROT 5.6 (L) 10/21/2023 1340   ALBUMIN 3.8 10/21/2023 1340   AST 33 10/21/2023 1340   AST 26 07/20/2023 1432   ALT 14 10/21/2023 1340   ALT 18 07/20/2023 1432   ALKPHOS 58 10/21/2023 1340   BILITOT 0.4 10/21/2023 1340   BILITOT 0.5 07/20/2023 1432   GFRNONAA 58 (L) 07/20/2023 1432   GFRNONAA 57 (L) 10/25/2020  1113   GFRAA 67 10/25/2020 1113    Assessment and Plan:   Kristen Jacobs is a 78 y.o. y/o female has been referred for   1.  Colon cancer screening  - Scheduling Colonoscopy I discussed risks of colonoscopy with patient to include risk of bleeding, colon perforation, and risk of sedation.  Patient expressed understanding and agrees to proceed with colonoscopy.  - I discussed colon cancer screening options with patient at length.  Risks and benefits of Cologuard versus traditional colonoscopy were discussed.  Patient would like to proceed with screening Cologuard test.  Patient declined traditional colonoscopy.  Patient is made aware they need repeat screening Cologuard every 3 years.  If Cologuard is positive, then we will need to schedule a diagnostic colonoscopy.  Patient expressed understanding.   2.  Comorbidities: Aortic atherosclerosis, hyperlipidemia, CKD stage II, rheumatoid arthritis, GERD, constipation.  Patient last saw cardiologist Dr. Wendel 04/2023.   Echo done 05/2023 showed normal LVEF 55 to 60%.  Mild aortic calcification but no stenosis.  11/2023 CT angio chest showed NO evidence of thoracic aortic aneurysm.  Follow up ***  Ellouise Console, PA-C

## 2024-01-06 ENCOUNTER — Ambulatory Visit: Admitting: Physician Assistant

## 2024-01-06 MED ORDER — TRAZODONE HCL 100 MG PO TABS: 100.0000 mg | ORAL_TABLET | Freq: Every day | ORAL | 3 refills | Status: AC

## 2024-01-13 ENCOUNTER — Ambulatory Visit

## 2024-01-13 VITALS — Ht 65.5 in | Wt 138.0 lb

## 2024-01-13 DIAGNOSIS — Z0000 Encounter for general adult medical examination without abnormal findings: Secondary | ICD-10-CM | POA: Diagnosis not present

## 2024-01-13 NOTE — Patient Instructions (Signed)
 Kristen Jacobs,  Thank you for taking the time for your Medicare Wellness Visit. I appreciate your continued commitment to your health goals. Please review the care plan we discussed, and feel free to reach out if I can assist you further.  Medicare recommends these wellness visits once per year to help you and your care team stay ahead of potential health issues. These visits are designed to focus on prevention, allowing your provider to concentrate on managing your acute and chronic conditions during your regular appointments.  Please note that Annual Wellness Visits do not include a physical exam. Some assessments may be limited, especially if the visit was conducted virtually. If needed, we may recommend a separate in-person follow-up with your provider.  Ongoing Care Seeing your primary care provider every 3 to 6 months helps us  monitor your health and provide consistent, personalized care.   Referrals If a referral was made during today's visit and you haven't received any updates within two weeks, please contact the referred provider directly to check on the status.  Recommended Screenings:  Health Maintenance  Topic Date Due   Screening for Lung Cancer  11/09/2024   Medicare Annual Wellness Visit  01/12/2025   DEXA scan (bone density measurement)  Completed   Hepatitis C Screening  Completed   HPV Vaccine  Aged Out   Meningitis B Vaccine  Aged Out   DTaP/Tdap/Td vaccine  Discontinued   Pneumococcal Vaccine for age over 6  Discontinued   Flu Shot  Discontinued   Colon Cancer Screening  Discontinued   COVID-19 Vaccine  Discontinued   Zoster (Shingles) Vaccine  Discontinued       01/13/2024    8:39 AM  Advanced Directives  Does Patient Have a Medical Advance Directive? Yes  Type of Estate agent of [place];Living will  Copy of Healthcare Power of Attorney in Chart? No - copy requested   Advance Care Planning is important because it: Ensures you receive  medical care that aligns with your values, goals, and preferences. Provides guidance to your family and loved ones, reducing the emotional burden of decision-making during critical moments.  Vision: Annual vision screenings are recommended for early detection of glaucoma, cataracts, and diabetic retinopathy. These exams can also reveal signs of chronic conditions such as diabetes and high blood pressure.  Dental: Annual dental screenings help detect early signs of oral cancer, gum disease, and other conditions linked to overall health, including heart disease and diabetes.  Please see the attached documents for additional preventive care recommendations.

## 2024-01-13 NOTE — Progress Notes (Signed)
 Internal Medicine Attending:  I reviewed the AWV findings of the medical professional who conducted the visit. I was present in the office suite and immediately available to provide assistance and direction throughout the time the service was provided.

## 2024-01-13 NOTE — Progress Notes (Signed)
 Because this visit was a virtual/telehealth visit,  certain criteria was not obtained, such a blood pressure, CBG if applicable, and timed get up and go. Any medications not marked as taking were not mentioned during the medication reconciliation part of the visit. Any vitals not documented were not able to be obtained due to this being a telehealth visit or patient was unable to self-report a recent blood pressure reading due to a lack of equipment at home via telehealth. Vitals that have been documented are verbally provided by the patient.   Subjective:   Kristen Jacobs is a 78 y.o. who presents for a Medicare Wellness preventive visit.  As a reminder, Annual Wellness Visits don't include a physical exam, and some assessments may be limited, especially if this visit is performed virtually. We may recommend an in-person follow-up visit with your provider if needed.  Visit Complete: Virtual I connected with  Kristen Jacobs on 01/13/24 by a audio enabled telemedicine application and verified that I am speaking with the correct person using two identifiers.  Patient Location: Home  Provider Location: Office/Clinic  I discussed the limitations of evaluation and management by telemedicine. The patient expressed understanding and agreed to proceed.  Vital Signs: Because this visit was a virtual/telehealth visit, some criteria may be missing or patient reported. Any vitals not documented were not able to be obtained and vitals that have been documented are patient reported.  VideoDeclined- This patient declined Librarian, academic. Therefore the visit was completed with audio only.  Persons Participating in Visit: Patient.  AWV Questionnaire: Yes: Patient Medicare AWV questionnaire was completed by the patient on 01/12/2024; I have confirmed that all information answered by patient is correct and no changes since this date.  Cardiac Risk Factors include: advanced age  (>44men, >43 women)     Objective:    Today's Vitals   01/13/24 0837 01/13/24 0850  Weight: 138 lb (62.6 kg)   Height: 5' 5.5 (1.664 m)   PainSc: 0-No pain 0-No pain   Body mass index is 22.62 kg/m.     01/13/2024    8:39 AM 11/11/2022    1:18 PM 11/11/2022   11:33 AM 09/05/2022   12:00 PM 05/08/2022   10:46 AM 10/31/2021   11:09 AM 07/16/2021   10:33 AM  Advanced Directives  Does Patient Have a Medical Advance Directive? Yes Yes Yes Yes Yes Yes Yes  Type of Estate agent of [place];Living will Healthcare Power of [place];Living will Healthcare Power of [place];Living will Healthcare Power of [place];Living will Healthcare Power of [place];Living will Healthcare Power of [place];Living will Healthcare Power of [place];Living will  Does patient want to make changes to medical advance directive?   No - Patient declined  No - Patient declined No - Patient declined   Copy of Healthcare Power of Attorney in Chart? No - copy requested No - copy requested No - copy requested  No - copy requested No - copy requested     Current Medications (verified) Outpatient Encounter Medications as of 01/13/2024  Medication Sig   Ascorbic Acid (VITAMIN C) 100 MG CHEW Chew by mouth.   aspirin  EC 81 MG tablet Take 1 tablet (81 mg total) by mouth daily. Swallow whole.   b complex vitamins capsule Take 1 capsule by mouth daily.   Cholecalciferol (VITAMIN D3 PO) Take 4,000 Units by mouth daily. With vitamin K   EPINEPHrine  (EPIPEN  2-PAK) 0.3 mg/0.3 mL IJ SOAJ injection Inject 0.3 mg into the  muscle as needed for anaphylaxis.   ezetimibe  (ZETIA ) 10 MG tablet Take 1 tablet (10 mg total) by mouth daily.   Glucosamine 500 MG CAPS Take 1 capsule by mouth. (Wild Shrimp Glucosamine)   hydroxychloroquine  (PLAQUENIL ) 200 MG tablet TAKE 1 TABLET BY MOUTH TWICE  DAILY MONDAY THROUGH FRIDAY  ONLY. NONE ON SATURDAY OR SUNDAY   ketoconazole (NIZORAL) 2 % shampoo Apply topically as needed.    methocarbamol  (ROBAXIN ) 500 MG tablet Take 1 tablet (500 mg total) by mouth 3 (three) times daily. (Muscle relaxer)   MISC NATURAL PRODUCTS PO Take 3 capsules by mouth daily. Bone Strength: Vitamin D3, Vitamin K1, Vitamin K2, Calcium , MAgnesium, Strontium, Vanadium   NONFORMULARY OR COMPOUNDED ITEM Place 0.5 Troches under the tongue 2 (two) times daily. Progesterone  180 mg, tri-estrogen 1.75 mg, testosterone  1.2 mg, DHEA 5 mg troche [employer], [place], [place]  Prescriber Edward Eckert   Prasterone, DHEA, (DHEA PO) Take by mouth daily.   traMADol  (ULTRAM ) 50 MG tablet Take 1 tablet (50 mg total) by mouth every 12 (twelve) hours as needed.   traZODone  (DESYREL ) 100 MG tablet Take 1 tablet (100 mg total) by mouth at bedtime.   Zinc 10 MG LOZG Use as directed in the mouth or throat.   No facility-administered encounter medications on file as of 01/13/2024.    Allergies (verified) Other and Sulfa antibiotics   History: Past Medical History:  Diagnosis Date   Avulsion fracture of left talus 09/05/2019   Bilateral foot pain    Constipation    COVID-19 virus infection 01/30/2022   Depression    Forgetfulness    Hepatitis    History of UTI 11/22/2020   Recent event, fatigue w/o dysuria, symptoms resolved with antibiotic (name not recalled).  Urine POC dipstick normal today.    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   Past Surgical History:  Procedure Laterality Date   BONE BIOPSY  09/21/2023   BREAST BIOPSY Bilateral    benign   CATARACT EXTRACTION Bilateral 03/2020   LAPAROSCOPY     LIVER BIOPSY     OOPHORECTOMY     right    REDUCTION MAMMAPLASTY Bilateral    breast lift   TONSILLECTOMY     age 77    Family History  Problem Relation Age of Onset   Dementia Mother     Cancer - Lung Father    Bipolar disorder Sister    Rectal cancer Sister    Leukemia Brother    Healthy Son    Asthma Son    Healthy Son    Breast cancer Neg Hx    Social History   Socioeconomic History   Marital status: Married    Spouse name: Not on file   Number of children: Not on file   Years of education: Not on file   Highest education level: Bachelor's degree (e.g., BA, AB, BS)  Occupational History   Not on file  Tobacco Use   Smoking status: Former    Current packs/day: 0.00    Average packs/day: 3.0 packs/day for 26.0 years (78.0 ttl pk-yrs)    Types: Cigarettes    Start date: 49    Quit date: 56    Years since quitting: 35.7    Passive exposure: Current (Husband smokes  cigars)   Smokeless tobacco: Never  Vaping Use   Vaping status: Never Used  Substance and Sexual Activity   Alcohol  use: Not Currently    Comment: quit 1990   Drug use: Not Currently    Types: Marijuana   Sexual activity: Not Currently  Other Topics Concern   Not on file  Social History Narrative   From [place], first generation American, 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  Moved to [facility] in late 2010's.  Enjoys healthful living, Mediterranean diet, staying active.  First husband passed away due to complications from diabetes.  She has since remarried.   Social Drivers of Corporate investment banker Strain: Low Risk  (01/13/2024)   Overall Financial Resource Strain (CARDIA)    Difficulty of Paying Living Expenses: Not hard at all  Food Insecurity: No Food Insecurity (01/13/2024)   Hunger Vital Sign    Worried About Running Out of Food in the Last Year: Never true    Ran Out of Food in the Last Year: Never true  Transportation Needs: No Transportation Needs (01/13/2024)   PRAPARE - Administrator, Civil Service (Medical): No    Lack of Transportation (Non-Medical): No  Physical Activity: Sufficiently Active (01/13/2024)   Exercise Vital Sign    Days of Exercise per  Week: 3 days    Minutes of Exercise per Session: 50 min  Stress: No Stress Concern Present (01/13/2024)   [employer] of Occupational Health - Occupational Stress Questionnaire    Feeling of Stress: Not at all  Social Connections: Socially Integrated (01/13/2024)   Social Connection and Isolation Panel    Frequency of Communication with Friends and Family: More than three times a week    Frequency of Social Gatherings with Friends and Family: More than three times a week    Attends Religious Services: More than 4 times per year    Active Member of [employer] or Organizations: Yes    Attends Engineer, structural: More than 4 times per year    Marital Status: Married    Tobacco Counseling Counseling given: Not Answered    Clinical Intake:  Pre-visit preparation completed: Yes  Pain : No/denies pain Pain Score: 0-No pain     BMI - recorded: 22.62 Nutritional Status: BMI of 19-24  Normal Nutritional Risks: None Diabetes: No  No results found for: HGBA1C   How often do you need to have someone help you when you read instructions, pamphlets, or other written materials from your doctor or pharmacy?: 1 - Never  Interpreter Needed?: No  Information entered by :: Nasiah Polinsky N. Boden Stucky, LPN.   Activities of Daily Living     01/13/2024    8:51 AM 01/12/2024   12:50 PM  In your present state of health, do you have any difficulty performing the following activities:  Hearing? 0 0  Vision? 0 0  Difficulty concentrating or making decisions? 0 0  Walking or climbing stairs? 0 0  Dressing or bathing? 0 0  Doing errands, shopping? 0 0  Preparing Food and eating ? N N  Using the Toilet? N N  In the past six months, have you accidently leaked urine? N N  Do you have problems with loss of bowel control? N N  Managing your Medications? N N  Managing your Finances? N N  Housekeeping or managing your Housekeeping? N N    Patient Care Team: Trudy Mliss Dragon, MD as PCP  - General (Internal Medicine) Thukkani, Arun K,  MD as PCP - Cardiology (Cardiology) Patrcia Sharper, MD as Consulting Physician (Ophthalmology)  I have updated your Care Teams any recent Medical Services you may have received from other providers in the past year.     Assessment:   This is a routine wellness examination for [place].  Hearing/Vision screen Hearing Screening - Comments:: Denies hearing difficulties.  Vision Screening - Comments:: Wears rx glasses - up to date with routine eye exams with Dominique Patrcia, MD.    Goals Addressed             This Visit's Progress    01/13/2024: To maintain my health.         Depression Screen     01/13/2024    8:40 AM 11/11/2022    1:18 PM 05/08/2022   10:47 AM 10/31/2021   11:11 AM 07/16/2021   10:33 AM 11/22/2020   10:58 AM  PHQ 2/9 Scores  PHQ - 2 Score 0 0 0 0 0 0  PHQ- 9 Score 1    0 0    Fall Risk     01/13/2024    8:51 AM 01/12/2024   12:50 PM 11/11/2022    1:18 PM 11/11/2022   10:33 AM 05/08/2022   10:46 AM  Fall Risk   Falls in the past year? 1 1 0 0 0  Number falls in past yr: 0 0 0 0 0  Injury with Fall? 0 0 0 0 0  Risk for fall due to :   No Fall Risks    Follow up Falls evaluation completed;Education provided  Falls evaluation completed;Falls prevention discussed Falls evaluation completed Falls evaluation completed      Data saved with a previous flowsheet row definition    MEDICARE RISK AT HOME:   Medicare Risk at Home Any stairs in or around the home?: Yes If so, are there any without handrails?: No Home free of loose throw rugs in walkways, pet beds, electrical cords, etc?: No Adequate lighting in your home to reduce risk of falls?: Yes Life alert?: No Use of a cane, walker or w/c?: No Grab bars in the bathroom?: Yes Shower chair or bench in shower?: No Elevated toilet seat or a handicapped toilet?: Yes  TIMED UP AND GO:  Was the test performed?  No  Cognitive Function: Declined/Normal: No cognitive  concerns noted by patient or family. Patient alert, oriented, able to answer questions appropriately and recall recent events. No signs of memory loss or confusion.    01/13/2024    8:40 AM  MMSE - Mini Mental State Exam  Not completed: Unable to complete        01/13/2024    8:50 AM 11/11/2022    1:18 PM  6CIT Screen  What Year? 0 points 0 points  What month? 0 points 0 points  What time? 0 points 0 points  Count back from 20 0 points 0 points  Months in reverse 0 points 0 points  Repeat phrase 0 points 0 points  Total Score 0 points 0 points    Immunizations Immunization History  Administered Date(s) Administered   Moderna Sars-Covid-2 Vaccination 11/17/2019, 12/05/2019, 01/02/2020    Screening Tests Health Maintenance  Topic Date Due   Lung Cancer Screening  11/09/2024   Medicare Annual Wellness (AWV)  01/12/2025   DEXA SCAN  Completed   Hepatitis C Screening  Completed   HPV VACCINES  Aged Out   Meningococcal B Vaccine  Aged Out   DTaP/Tdap/Td  Discontinued  Pneumococcal Vaccine: 50+ Years  Discontinued   Influenza Vaccine  Discontinued   Colonoscopy  Discontinued   COVID-19 Vaccine  Discontinued   Zoster Vaccines- Shingrix  Discontinued    Health Maintenance Items Addressed: Yes, patient is up to date with care gaps.  Additional Screening:  Vision Screening: Recommended annual ophthalmology exams for early detection of glaucoma and other disorders of the eye. Is the patient up to date with their annual eye exam?  Yes  Who is the provider or what is the name of the office in which the patient attends annual eye exams? Ozell Bertin, MD.  Dental Screening: Recommended annual dental exams for proper oral hygiene  Community Resource Referral / Chronic Care Management: CRR required this visit?  No   CCM required this visit?  No   Plan:    I have personally reviewed and noted the following in the patient's chart:   Medical and social history Use of  alcohol , tobacco or illicit drugs  Current medications and supplements including opioid prescriptions. Patient is not currently taking opioid prescriptions. Functional ability and status Nutritional status Physical activity Advanced directives List of other physicians Hospitalizations, surgeries, and ER visits in previous 12 months Vitals Screenings to include cognitive, depression, and falls Referrals and appointments  In addition, I have reviewed and discussed with patient certain preventive protocols, quality metrics, and best practice recommendations. A written personalized care plan for preventive services as well as general preventive health recommendations were provided to patient.   Roz LOISE Fuller, LPN   [id number]   After Visit Summary: (MyChart) Due to this being a telephonic visit, the after visit summary with patients personalized plan was offered to patient via MyChart   Notes: Nothing significant to report at this time.

## 2024-02-09 NOTE — Progress Notes (Signed)
 Office Visit Note  Patient: Kristen Jacobs             Date of Birth: 31-Jan-1946           MRN: [id number]             PCP: Kristen Mliss Dragon, MD Referring: Kristen Mliss Dragon, MD Visit Date: 02/23/2024 Occupation: Data Unavailable  Subjective:  Medication monitoring   History of Present Illness: Kristen Jacobs is a 78 y.o. female with history of seropositive rheumatoid arthritis and osteoarthritis. Patient remains on  Plaquenil  200 mg 1 tablet by mouth twice daily Monday to Friday.  She is tolerating Plaquenil  without any side effects and has not had any gaps in therapy.  She has not been experiencing any morning stiffness, nocturnal pain, or difficulty performing ADLs.  At times she has noticed an increase in stiffness along the fifth metacarpal and bilateral fifth MCP joints.  She has not noticed any joint swelling.  The symptoms typically last for about 30 minutes and are self resolving.  She has not needed to take any over-the-counter products for symptomatic relief.  She has a prescription for tramadol  which she take sparingly for pain relief.   Activities of Daily Living:  Patient reports morning stiffness for 0 minute.   Patient Denies nocturnal pain.  Difficulty dressing/grooming: Denies Difficulty climbing stairs: Denies Difficulty getting out of chair: Denies Difficulty using hands for taps, buttons, cutlery, and/or writing: Reports  Review of Systems  Constitutional:  Positive for fatigue.  HENT:  Negative for mouth sores and mouth dryness.   Eyes:  Negative for dryness.  Respiratory:  Negative for shortness of breath.   Cardiovascular:  Negative for chest pain and palpitations.  Gastrointestinal:  Negative for blood in stool, constipation and diarrhea.  Endocrine: Negative for increased urination.  Genitourinary:  Negative for involuntary urination.  Musculoskeletal:  Positive for joint pain, joint pain, myalgias, muscle weakness, muscle tenderness and myalgias.  Negative for gait problem, joint swelling and morning stiffness.  Skin:  Negative for color change, rash, hair loss and sensitivity to sunlight.  Allergic/Immunologic: Negative for susceptible to infections.  Neurological:  Negative for dizziness and headaches.  Hematological:  Negative for swollen glands.  Psychiatric/Behavioral:  Negative for depressed mood and sleep disturbance. The patient is not nervous/anxious.     PMFS History:  Patient Active Problem List   Diagnosis Date Noted   MGUS (monoclonal gammopathy of unknown significance) 11/10/2023   Aneurysm of ascending aorta without rupture 11/10/2023   Elevated lipoprotein A level 11/10/2023   Burning sensation of feet 11/10/2023   Bilateral mastodynia 11/10/2023   Excessive ear wax, bilateral 07/02/2023   History of smoking 11/11/2022   Forgetfulness 11/11/2022   Bilateral foot pain 11/11/2022   Lumbar spondylosis 08/29/2022   Thyroid  nodules; small, benign appearance by US  2023 12/10/2021   Insomnia disorder 10/31/2021   Fear of vaccinations 02/22/2021   History of malignant neoplasm of skin 11/22/2020   Age-related osteoporosis without fracture 11/22/2020   Major depression in remission 11/22/2020   History of hepatitis C 04/05/2020   Cervical spondylosis with radiculopathy 02/21/2020   Rheumatoid arthritis (HCC) 02/17/2020   Laryngopharyngeal reflux (LPR) 12/20/2019   Primary osteoarthritis of left knee 07/19/2019   Bilateral wrist pain, 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 05/24/2019   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   Chronic right shoulder pain 11/12/2017   Chronic right hip pain 08/11/2017    Past Medical History:  Diagnosis Date   Avulsion fracture of left  talus 09/05/2019   Bilateral foot pain    Constipation    COVID-19 virus infection 01/30/2022   Depression    Forgetfulness    Hepatitis    History of UTI 11/22/2020   Recent event, fatigue w/o dysuria, symptoms resolved with antibiotic (name not  recalled).  Urine POC dipstick normal today.    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    Family History  Problem Relation Age of Onset   Dementia Mother    Cancer - Lung Father    Cancer Sister        Anal Cancer   Bipolar disorder Sister    Leukemia Brother    Healthy Son    Asthma Son    Breast cancer Neg Hx    Past Surgical History:  Procedure Laterality Date   BONE BIOPSY  09/21/2023   BREAST BIOPSY Bilateral    benign   CATARACT EXTRACTION Bilateral 03/2020   LAPAROSCOPY     LIVER BIOPSY     OOPHORECTOMY     right    REDUCTION MAMMAPLASTY Bilateral    breast lift   TONSILLECTOMY     age 72    Social History   Tobacco Use   Smoking status: Former    Current packs/day: 0.00    Average packs/day: 3.0 packs/day for 26.0 years (78.0 ttl pk-yrs)    Types: Cigarettes    Start date: 1964    Quit date: 24    Years since quitting: 35.8    Passive exposure: Current (Husband smokes cigars)   Smokeless tobacco: Never  Vaping Use   Vaping status: Never Used  Substance Use Topics   Alcohol  use: Not Currently    Comment: quit 1990   Drug use: Not Currently    Types: Marijuana   Social History   Social History Narrative   From [place], first Education officer, environmental, 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  Moved to [facility] in late 2010's.  Enjoys healthful living, Mediterranean diet, staying active.  First husband passed away due to complications from diabetes.  She has since remarried.     Immunization History  Administered Date(s) Administered   Moderna Sars-Covid-2 Vaccination 11/17/2019, 12/05/2019, 01/02/2020     Objective: Vital Signs: BP 102/66   Pulse 69   Temp (!) 97.1 F (36.2 C)   Resp 14   Ht 5' 5.5 (1.664 m)   Wt 143 lb (64.9 kg)   BMI 23.43  kg/m    Physical Exam Vitals and nursing note reviewed.  Constitutional:      Appearance: She is well-developed.  HENT:     Head: Normocephalic and atraumatic.  Eyes:     Conjunctiva/sclera: Conjunctivae normal.  Cardiovascular:     Rate and Rhythm: Normal rate and regular rhythm.     Heart sounds: Normal heart sounds.  Pulmonary:     Effort: Pulmonary effort is normal.     Breath sounds: Normal breath sounds.  Abdominal:     General: Bowel sounds are normal.     Palpations: Abdomen is soft.  Musculoskeletal:     Cervical back: Normal range of motion.  Lymphadenopathy:     Cervical: No cervical adenopathy.  Skin:    General: Skin is warm and dry.     Capillary Refill: Capillary refill takes  less than 2 seconds.  Neurological:     Mental Status: She is alert and oriented to person, place, and time.  Psychiatric:        Behavior: Behavior normal.      Musculoskeletal Exam: C-spine, thoracic spine, lumbar spine have good range of motion.  No midline spinal tenderness.  No SI joint tenderness.  Shoulder joints, elbow joints, wrist joints, MCPs, PIPs, DIPs have good range of motion with no synovitis.  Complete fist formation bilaterally.  Hip joints have good range of motion with no groin pain.  Knee joints have good range of motion no warmth or effusion.  Ankle joints have good range of motion no tenderness or joint swelling.     CDAI Exam: CDAI Score: -- Patient Global: --; Provider Global: -- Swollen: --; Tender: -- Joint Exam 02/23/2024   No joint exam has been documented for this visit   There is currently no information documented on the homunculus. Go to the Rheumatology activity and complete the homunculus joint exam.  Investigation: No additional findings.  Imaging: No results found.  Recent Labs: Lab Results  Component Value Date   WBC 5.3 09/21/2023   HGB 13.3 09/21/2023   PLT 217 09/21/2023   NA 137 11/05/2023   K 4.7 11/05/2023   CL 103 11/05/2023    CO2 19 (L) 11/05/2023   GLUCOSE 89 11/05/2023   BUN 15 11/05/2023   CREATININE 1.00 11/05/2023   BILITOT 0.4 10/21/2023   ALKPHOS 58 10/21/2023   AST 33 10/21/2023   ALT 14 10/21/2023   PROT 5.6 (L) 10/21/2023   ALBUMIN 3.8 10/21/2023   CALCIUM  9.5 11/05/2023   GFRAA 67 10/25/2020    Speciality Comments: PLQ eye exam: 09/24/2023 WNL  Opthamology f/u 12 months.  PLQ eye exam scheduled for 09/24/2023 per patient.  Procedures:  No procedures performed Allergies: Other    Assessment / Plan:     Visit Diagnoses: Rheumatoid arthritis involving multiple sites with positive rheumatoid factor (HCC) - MRI of the wrist joint showed synovitis, tenosynovitis and possible erosions: She has no synovitis on examination today.  She has not had any signs or symptoms of a rheumatoid arthritis flare.  She has clinically been doing well taking Plaquenil  200 mg 1 tablet by mouth twice daily Monday through Friday.  She is tolerating Plaquenil  without any side effects and has not had any gaps in therapy.  She experiences occasional stiffness in both hands especially in the fifth MCP joint bilaterally.  Discussed use of arthritis compression gloves.  She has not been taking over-the-counter products for symptomatic relief.  Her symptoms have been well-controlled taking Plaquenil  as prescribed.  Association of heart disease with rheumatoid arthritis was discussed. Need to monitor blood pressure, cholesterol, and to exercise 30-60 minutes on daily basis was discussed.  Lipid panel updated today.  Blood pressure is well-controlled.  Patient continues to go to Pilates for exercise.  She was advised to notify us  if she develops any signs or symptoms of a flare.  She will follow-up in the office in 5 months or sooner if needed.  - Plan: Lipid panel  High risk medication use - Plaquenil  200 mg 1 tablet by mouth twice daily Monday to Friday.  PLQ eye exam: 09/24/2023 [facility] Op[facility] f/u 12 months.   BMP updated on 11/05/23.  - Plan: Comprehensive metabolic panel with GFR, CBC with Differential/Platelet, Lipid panel  Lipid screening - Association of heart disease with rheumatoid arthritis was discussed. Need to monitor  blood pressure, cholesterol, and to exercise 30-60 minutes on daily basis was discussed. Lipid panel updated today.  Plan: Lipid panel  Pain in both hands: She experiences occasional stiffness of bilateral fifth MCP joints lasting for about 30 minutes and self resolving.  On examination she has no tenderness or synovitis on examination.  She is able to make a complete fist bilaterally.  The symptoms have been mild and fleeting.  She has not needed to take any over-the-counter products for symptomatic relief.  She has tramadol  for moderate to severe pain relief if needed.  Primary osteoarthritis of left knee: No warmth or effusion noted.  Primary osteoarthritis of both feet: She has good range of motion of both ankle joints with no tenderness or joint swelling.  DDD (degenerative disc disease), cervical - Dr. Mavis: She has good range of motion of the cervical spine on examination today.  No symptoms of radiculopathy.  Closed nondisplaced avulsion fracture of left talus with routine healing, subsequent encounter  Age-related osteoporosis without fracture - 06/04/2022 DEXA scan: The BMD measured at [facility] Right is 0.648 g/cm2 with a T-score of -2.9.  The use of Fosamax discussed after last visit on 06/05/2023. Next DEXA due in January 2026. She is taking vitamin D  4000 units daily.  Vitamin D  deficiency: She is taking vitamin D  4000 units daily.  Other medical conditions are listed as follows:   Primary insomnia  History of hepatitis C  Other fatigue  MGUS (monoclonal gammopathy of unknown significance)  Anxiety    Orders: Orders Placed This Encounter  Procedures   Comprehensive metabolic panel with GFR   CBC with Differential/Platelet   Lipid panel    No orders of the defined types were placed in this encounter.   Follow-Up Instructions: Return in about 5 months (around 07/23/2024) for Rheumatoid arthritis, Osteoarthritis.   Waddell CHRISTELLA Craze, PA-C  Note - This record has been created using Jacobs software.  Chart creation errors have been sought, but may not always  have been located. Such creation errors do not reflect on  the standard of medical care.

## 2024-02-23 ENCOUNTER — Ambulatory Visit: Attending: Physician Assistant | Admitting: Physician Assistant

## 2024-02-23 ENCOUNTER — Encounter: Payer: Self-pay | Admitting: Physician Assistant

## 2024-02-23 ENCOUNTER — Other Ambulatory Visit: Payer: Self-pay | Admitting: Internal Medicine

## 2024-02-23 VITALS — BP 102/66 | HR 69 | Temp 97.1°F | Resp 14 | Ht 65.5 in | Wt 143.0 lb

## 2024-02-23 DIAGNOSIS — R5383 Other fatigue: Secondary | ICD-10-CM

## 2024-02-23 DIAGNOSIS — M0579 Rheumatoid arthritis with rheumatoid factor of multiple sites without organ or systems involvement: Secondary | ICD-10-CM

## 2024-02-23 DIAGNOSIS — E559 Vitamin D deficiency, unspecified: Secondary | ICD-10-CM

## 2024-02-23 DIAGNOSIS — M1712 Unilateral primary osteoarthritis, left knee: Secondary | ICD-10-CM

## 2024-02-23 DIAGNOSIS — M5412 Radiculopathy, cervical region: Secondary | ICD-10-CM

## 2024-02-23 DIAGNOSIS — M79642 Pain in left hand: Secondary | ICD-10-CM

## 2024-02-23 DIAGNOSIS — M79641 Pain in right hand: Secondary | ICD-10-CM

## 2024-02-23 DIAGNOSIS — Z79899 Other long term (current) drug therapy: Secondary | ICD-10-CM

## 2024-02-23 DIAGNOSIS — M19072 Primary osteoarthritis, left ankle and foot: Secondary | ICD-10-CM

## 2024-02-23 DIAGNOSIS — F419 Anxiety disorder, unspecified: Secondary | ICD-10-CM

## 2024-02-23 DIAGNOSIS — M19071 Primary osteoarthritis, right ankle and foot: Secondary | ICD-10-CM

## 2024-02-23 DIAGNOSIS — Z13220 Encounter for screening for lipoid disorders: Secondary | ICD-10-CM

## 2024-02-23 DIAGNOSIS — S92155D Nondisplaced avulsion fracture (chip fracture) of left talus, subsequent encounter for fracture with routine healing: Secondary | ICD-10-CM

## 2024-02-23 DIAGNOSIS — D472 Monoclonal gammopathy: Secondary | ICD-10-CM

## 2024-02-23 DIAGNOSIS — Z8619 Personal history of other infectious and parasitic diseases: Secondary | ICD-10-CM

## 2024-02-23 DIAGNOSIS — M4722 Other spondylosis with radiculopathy, cervical region: Secondary | ICD-10-CM

## 2024-02-23 DIAGNOSIS — M818 Other osteoporosis without current pathological fracture: Secondary | ICD-10-CM

## 2024-02-23 DIAGNOSIS — F5101 Primary insomnia: Secondary | ICD-10-CM

## 2024-02-23 DIAGNOSIS — M5030 Other cervical disc degeneration, unspecified cervical region: Secondary | ICD-10-CM

## 2024-02-23 LAB — COMPREHENSIVE METABOLIC PANEL WITH GFR
AG Ratio: 1.7 (calc) (ref 1.0–2.5)
ALT: 14 U/L (ref 6–29)
AST: 20 U/L (ref 10–35)
Albumin: 3.9 g/dL (ref 3.6–5.1)
Alkaline phosphatase (APISO): 49 U/L (ref 37–153)
BUN: 18 mg/dL (ref 7–25)
CO2: 28 mmol/L (ref 20–32)
Calcium: 9.8 mg/dL (ref 8.6–10.4)
Chloride: 105 mmol/L (ref 98–110)
Creat: 0.99 mg/dL (ref 0.60–1.00)
Globulin: 2.3 g/dL (ref 1.9–3.7)
Glucose, Bld: 80 mg/dL (ref 65–99)
Potassium: 5 mmol/L (ref 3.5–5.3)
Sodium: 137 mmol/L (ref 135–146)
Total Bilirubin: 0.5 mg/dL (ref 0.2–1.2)
Total Protein: 6.2 g/dL (ref 6.1–8.1)
eGFR: 58 mL/min/1.73m2 — ABNORMAL LOW (ref >=60)

## 2024-02-23 LAB — LIPID PANEL
Cholesterol: 169 mg/dL (ref <200)
HDL: 59 mg/dL (ref >=50)
LDL Cholesterol (Calc): 91 mg/dL
Non-HDL Cholesterol (Calc): 110 mg/dL (ref <130)
Total CHOL/HDL Ratio: 2.9 (calc) (ref <5.0)
Triglycerides: 98 mg/dL (ref <150)

## 2024-02-23 LAB — CBC WITH DIFFERENTIAL/PLATELET
Absolute Lymphocytes: 1241 {cells}/uL (ref 850–3900)
Absolute Monocytes: 574 {cells}/uL (ref 200–950)
Basophils Absolute: 59 {cells}/uL (ref 0–200)
Basophils Relative: 0.9 %
Eosinophils Absolute: 323 {cells}/uL (ref 15–500)
Eosinophils Relative: 4.9 %
HCT: 43.5 % (ref 35.0–45.0)
Hemoglobin: 14.2 g/dL (ref 11.7–15.5)
MCH: 30.3 pg (ref 27.0–33.0)
MCHC: 32.6 g/dL (ref 32.0–36.0)
MCV: 92.8 fL (ref 80.0–100.0)
MPV: 11.4 fL (ref 7.5–12.5)
Monocytes Relative: 8.7 %
Neutro Abs: 4402 {cells}/uL (ref 1500–7800)
Neutrophils Relative %: 66.7 %
Platelets: 239 Thousand/uL (ref 140–400)
RBC: 4.69 Million/uL (ref 3.80–5.10)
RDW: 11.7 % (ref 11.0–15.0)
Total Lymphocyte: 18.8 %
WBC: 6.6 Thousand/uL (ref 3.8–10.8)

## 2024-02-23 MED ORDER — TRAMADOL HCL 50 MG PO TABS: 50.0000 mg | ORAL_TABLET | Freq: Two times a day (BID) | ORAL | 3 refills | Status: AC | PRN

## 2024-02-23 NOTE — Telephone Encounter (Signed)
 Copied from CRM [id number]. Topic: Clinical - Medication Refill >> Feb 23, 2024  9:50 AM DeAngela L wrote: Medication: traMADol  (ULTRAM ) 50 MG tablet The patient is asking if her provider could [facility] cause her Ortho doctor usually does this medication refill has left the [facility]   Has the patient contacted their pharmacy? No, she did not have the refill number available  (Agent: If no, request that the patient contact the pharmacy for the refill. If patient does not wish to contact the pharmacy document the reason why and proceed with request.) (Agent: If yes, when and what did the pharmacy advise?)  This is the patient's preferred pharmacy:  [id number] [place], [place] - [address] [facility] [place] [address] Phone: [phone] Fax: [phone]  Is this the correct pharmacy for this prescription? Yes  If no, delete pharmacy and type the correct one.   Has the prescription been filled recently? Yes   Is the patient out of the medication? Yes   Has the patient been seen for an appointment in the last year OR does the patient have an upcoming appointment? Yes   Can we respond through MyChart? Yes   Agent: Please be advised that Rx refills may take up to 3 business days. We ask that you follow-up with your pharmacy.

## 2024-02-24 ENCOUNTER — Ambulatory Visit: Payer: Self-pay | Admitting: Physician Assistant

## 2024-02-24 NOTE — Progress Notes (Signed)
 GFR remains borderline low-58 but has improved. Rest of CMP WNL.  CBC WNL Lipid panel WNL

## 2024-02-25 ENCOUNTER — Telehealth: Payer: Self-pay

## 2024-02-25 NOTE — Telephone Encounter (Signed)
 Prior Authorization for patient (traMADol  HCl 50MG  tablets) came through on cover my meds was submitted with last office notes awaiting approval or denial.  [id number]

## 2024-02-25 NOTE — Telephone Encounter (Signed)
 Kristen Jacobs (KeyBETHA NINE) PA Case ID #: [id number] Need Help? Call us  at [phone] Outcome Approved today by [employer] 2017 NCPDP Request Reference Number: [id number]. TRAMADOL  HCL TAB 50MG  is approved through 05/04/2025. Your patient may now fill this prescription and it will be covered. Effective Date: 02/25/2024 Authorization Expiration Date: 05/04/2025 Drug traMADol  HCl 50MG  tablets ePA cloud logo Form [employer] Medicare Part D Electronic Prior Authorization Form [phone] NCPDP)  Patient is aware of approval.

## 2024-02-26 NOTE — Telephone Encounter (Signed)
 Rtn call to patient.  The was already called and sch an appointment with [facility] and she no showed her appointment please see below. LMOM for the pt call back to their office to resch her missed appointment.   Patient no showed 9/3 appointment Name: Kristen, Jacobs MRN: [id number]  Date: 01/06/2024 Status: No Show  Time: 2:10 PM Length: 20  Visit Type: NEW PATIENT 20 [127] Copay: $0.00  Provider: [place], PA-C Department: [facility]   [facility]   [facility] [address] [place] [address] [place],  [place]  [address]   Main: [phone]             Copied from CRM [id number]. Topic: Referral - Question >> Feb 23, 2024  9:49 AM DeAngela L wrote: Reason for CRM: Patient calling to ask if the provider put in a request to a hemrriod doctor, she never received a call to schedule an appointment so she was checking the status   Pt num [phone] (H)

## 2024-03-13 ENCOUNTER — Other Ambulatory Visit: Payer: Self-pay | Admitting: Hematology and Oncology

## 2024-03-13 DIAGNOSIS — D472 Monoclonal gammopathy: Secondary | ICD-10-CM

## 2024-03-14 ENCOUNTER — Inpatient Hospital Stay: Attending: Hematology and Oncology

## 2024-03-14 DIAGNOSIS — Z7982 Long term (current) use of aspirin: Secondary | ICD-10-CM | POA: Diagnosis not present

## 2024-03-14 DIAGNOSIS — Z634 Disappearance and death of family member: Secondary | ICD-10-CM | POA: Diagnosis not present

## 2024-03-14 DIAGNOSIS — M25559 Pain in unspecified hip: Secondary | ICD-10-CM | POA: Insufficient documentation

## 2024-03-14 DIAGNOSIS — Z87891 Personal history of nicotine dependence: Secondary | ICD-10-CM | POA: Diagnosis not present

## 2024-03-14 DIAGNOSIS — F32A Depression, unspecified: Secondary | ICD-10-CM | POA: Insufficient documentation

## 2024-03-14 DIAGNOSIS — M069 Rheumatoid arthritis, unspecified: Secondary | ICD-10-CM | POA: Diagnosis not present

## 2024-03-14 DIAGNOSIS — Z9089 Acquired absence of other organs: Secondary | ICD-10-CM | POA: Diagnosis not present

## 2024-03-14 DIAGNOSIS — G8929 Other chronic pain: Secondary | ICD-10-CM | POA: Diagnosis not present

## 2024-03-14 DIAGNOSIS — Z9842 Cataract extraction status, left eye: Secondary | ICD-10-CM | POA: Insufficient documentation

## 2024-03-14 DIAGNOSIS — C9000 Multiple myeloma not having achieved remission: Secondary | ICD-10-CM | POA: Insufficient documentation

## 2024-03-14 DIAGNOSIS — Z79890 Hormone replacement therapy (postmenopausal): Secondary | ICD-10-CM | POA: Diagnosis not present

## 2024-03-14 DIAGNOSIS — Z8616 Personal history of COVID-19: Secondary | ICD-10-CM | POA: Diagnosis not present

## 2024-03-14 DIAGNOSIS — Z9841 Cataract extraction status, right eye: Secondary | ICD-10-CM | POA: Insufficient documentation

## 2024-03-14 DIAGNOSIS — Z87440 Personal history of urinary (tract) infections: Secondary | ICD-10-CM | POA: Insufficient documentation

## 2024-03-14 DIAGNOSIS — D472 Monoclonal gammopathy: Secondary | ICD-10-CM

## 2024-03-14 DIAGNOSIS — Z90721 Acquired absence of ovaries, unilateral: Secondary | ICD-10-CM | POA: Insufficient documentation

## 2024-03-14 DIAGNOSIS — Z79899 Other long term (current) drug therapy: Secondary | ICD-10-CM | POA: Diagnosis not present

## 2024-03-14 LAB — CMP (CANCER CENTER ONLY)
ALT: 10 U/L (ref 0–44)
AST: 17 U/L (ref 15–41)
Albumin: 3.9 g/dL (ref 3.5–5.0)
Alkaline Phosphatase: 55 U/L (ref 38–126)
Anion gap: 5 (ref 5–15)
BUN: 23 mg/dL (ref 8–23)
CO2: 25 mmol/L (ref 22–32)
Calcium: 9.5 mg/dL (ref 8.9–10.3)
Chloride: 107 mmol/L (ref 98–111)
Creatinine: 1.17 mg/dL — ABNORMAL HIGH (ref 0.44–1.00)
GFR, Estimated: 47 mL/min — ABNORMAL LOW (ref >60)
Glucose, Bld: 90 mg/dL (ref 70–99)
Potassium: 4.5 mmol/L (ref 3.5–5.1)
Sodium: 137 mmol/L (ref 135–145)
Total Bilirubin: 0.4 mg/dL (ref 0.0–1.2)
Total Protein: 6.5 g/dL (ref 6.5–8.1)

## 2024-03-14 LAB — CBC WITH DIFFERENTIAL (CANCER CENTER ONLY)
Abs Immature Granulocytes: 0.03 K/uL (ref 0.00–0.07)
Basophils Absolute: 0 K/uL (ref 0.0–0.1)
Basophils Relative: 1 %
Eosinophils Absolute: 0.3 K/uL (ref 0.0–0.5)
Eosinophils Relative: 6 %
HCT: 41.6 % (ref 36.0–46.0)
Hemoglobin: 14.2 g/dL (ref 12.0–15.0)
Immature Granulocytes: 1 %
Lymphocytes Relative: 20 %
Lymphs Abs: 1 K/uL (ref 0.7–4.0)
MCH: 30 pg (ref 26.0–34.0)
MCHC: 34.1 g/dL (ref 30.0–36.0)
MCV: 87.9 fL (ref 80.0–100.0)
Monocytes Absolute: 0.5 K/uL (ref 0.1–1.0)
Monocytes Relative: 9 %
Neutro Abs: 3.4 K/uL (ref 1.7–7.7)
Neutrophils Relative %: 63 %
Platelet Count: 236 K/uL (ref 150–400)
RBC: 4.73 MIL/uL (ref 3.87–5.11)
RDW: 11.9 % (ref 11.5–15.5)
WBC Count: 5.3 K/uL (ref 4.0–10.5)
nRBC: 0 % (ref 0.0–0.2)

## 2024-03-14 LAB — LACTATE DEHYDROGENASE: LDH: 138 U/L (ref 98–192)

## 2024-03-15 LAB — KAPPA/LAMBDA LIGHT CHAINS
Kappa free light chain: 182.6 mg/L — ABNORMAL HIGH (ref 3.3–19.4)
Kappa, lambda light chain ratio: 9.92 — ABNORMAL HIGH (ref 0.26–1.65)
Lambda free light chains: 18.4 mg/L (ref 5.7–26.3)

## 2024-03-16 ENCOUNTER — Encounter: Payer: Self-pay | Admitting: Internal Medicine

## 2024-03-18 LAB — MULTIPLE MYELOMA PANEL, SERUM
Albumin SerPl Elph-Mcnc: 3.5 g/dL (ref 2.9–4.4)
Albumin/Glob SerPl: 1.3 (ref 0.7–1.7)
Alpha 1: 0.3 g/dL (ref 0.0–0.4)
Alpha2 Glob SerPl Elph-Mcnc: 0.9 g/dL (ref 0.4–1.0)
B-Globulin SerPl Elph-Mcnc: 0.9 g/dL (ref 0.7–1.3)
Gamma Glob SerPl Elph-Mcnc: 0.7 g/dL (ref 0.4–1.8)
Globulin, Total: 2.7 g/dL (ref 2.2–3.9)
IgA: 122 mg/dL (ref 64–422)
IgG (Immunoglobin G), Serum: 688 mg/dL (ref 586–1602)
IgM (Immunoglobulin M), Srm: 86 mg/dL (ref 26–217)
M Protein SerPl Elph-Mcnc: 0.2 g/dL — ABNORMAL HIGH
Total Protein ELP: 6.2 g/dL (ref 6.0–8.5)

## 2024-03-21 ENCOUNTER — Inpatient Hospital Stay: Admitting: Hematology and Oncology

## 2024-03-21 VITALS — BP 125/69 | HR 71 | Temp 98.3°F | Resp 14 | Wt 144.1 lb

## 2024-03-21 DIAGNOSIS — D472 Monoclonal gammopathy: Secondary | ICD-10-CM

## 2024-03-21 DIAGNOSIS — C9000 Multiple myeloma not having achieved remission: Secondary | ICD-10-CM | POA: Diagnosis not present

## 2024-03-21 NOTE — Progress Notes (Signed)
 [facility] [phone]   [phone]  PROGRESS NOTE  Patient Care Team: Trudy Mliss Dragon, MD as PCP - General (Internal Medicine) Thukkani, Arun K, MD as PCP - Cardiology (Cardiology) Patrcia Sharper, MD as Consulting Physician (Ophthalmology)  Hematological/Oncological History # IgG Kappa Monoclonal Gammopathy  06/05/2023: SPEP showed no M protein, but IFE detected a monoclonal IgG Kappa 07/19/2023: establish care with Dr. Federico.  M protein 0.2 with kappa lambda ratio of 8.88 09/21/2023: Bone marrow biopsy showed approximately 4% abnormal plasma cells  Interval History:  Kristen Jacobs 78 y.o. female with medical history significant for IgG kappa MGUS who presents for a follow up visit. The patient's last visit was on 3/172/2025. In the interim since the last visit she has had no major changes in her health.  On exam today Kristen Jacobs reports she has been fine overall in the room since her last visit.  She has had no issues with runny nose, sore throat, cough.  She denies any fevers, chills, sweats.  She reports she always has a runny nose because that she has allergies to her cats.  She refuses to get rid of her cats and would rather just live with it.  She reports that she does have her chronic lower hip pain but no new bone or back pain.  She reports is not having any urinary issues such as change in the color or bubbling/foaming.  She reports her energy levels are the same with no major changes.  She reports nothing else out of the ordinary.  She is not started any new medications and has no upcoming planned surgeries or dental work.  Full 10 point ROS is otherwise negative.  MEDICAL HISTORY:  Past Medical History:  Diagnosis Date   Avulsion fracture of left talus 09/05/2019   Bilateral foot pain    Constipation    COVID-19 virus infection 01/30/2022   Depression    Forgetfulness    Hepatitis    History of UTI 11/22/2020   Recent event,  fatigue w/o dysuria, symptoms resolved with antibiotic (name not recalled).  Urine POC dipstick normal today.    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    SURGICAL HISTORY: Past Surgical History:  Procedure Laterality Date   BONE BIOPSY  09/21/2023   BREAST BIOPSY Bilateral    benign   CATARACT EXTRACTION Bilateral 03/2020   LAPAROSCOPY     LIVER BIOPSY     OOPHORECTOMY     right    REDUCTION MAMMAPLASTY Bilateral    breast lift   TONSILLECTOMY     age 49     SOCIAL HISTORY: Social History   Socioeconomic History   Marital status: Married    Spouse name: Not on file   Number of children: Not on file   Years of education: Not on file   Highest education level: Bachelor's degree (e.g., BA, AB, BS)  Occupational History   Not on file  Tobacco Use   Smoking status: Former    Current packs/day: 0.00    Average packs/day: 3.0 packs/day for 26.0 years (78.0 ttl pk-yrs)    Types: Cigarettes    Start date: 56    Quit date: 43    Years since quitting: 35.9    Passive exposure: Current (Husband  smokes cigars)   Smokeless tobacco: Never  Vaping Use   Vaping status: Never Used  Substance and Sexual Activity   Alcohol  use: Not Currently    Comment: quit 1990   Drug use: Not Currently    Types: Marijuana   Sexual activity: Not Currently  Other Topics Concern   Not on file  Social History Narrative   From [place], first generation American, family is [place] ([place]).  Moved to [facility] in late 2010's.  Enjoys healthful living, Mediterranean diet, staying active.  First husband passed away due to complications from diabetes.  She has since remarried.   Social Drivers of Corporate Investment Banker Strain: Low Risk  (01/13/2024)   Overall Financial Resource  Strain (CARDIA)    Difficulty of Paying Living Expenses: Not hard at all  Food Insecurity: No Food Insecurity (01/13/2024)   Hunger Vital Sign    Worried About Running Out of Food in the Last Year: Never true    Ran Out of Food in the Last Year: Never true  Transportation Needs: No Transportation Needs (01/13/2024)   PRAPARE - Administrator, Civil Service (Medical): No    Lack of Transportation (Non-Medical): No  Physical Activity: Sufficiently Active (01/13/2024)   Exercise Vital Sign    Days of Exercise per Week: 3 days    Minutes of Exercise per Session: 50 min  Stress: No Stress Concern Present (01/13/2024)   [employer] of Occupational Health - Occupational Stress Questionnaire    Feeling of Stress: Not at all  Social Connections: Socially Integrated (01/13/2024)   Social Connection and Isolation Panel    Frequency of Communication with Friends and Family: More than three times a week    Frequency of Social Gatherings with Friends and Family: More than three times a week    Attends Religious Services: More than 4 times per year    Active Member of [employer] or Organizations: Yes    Attends Engineer, Structural: More than 4 times per year    Marital Status: Married  Catering Manager Violence: Not At Risk (01/13/2024)   Humiliation, Afraid, Rape, and Kick questionnaire    Fear of Current or Ex-Partner: No    Emotionally Abused: No    Physically Abused: No    Sexually Abused: No    FAMILY HISTORY: Family History  Problem Relation Age of Onset   Dementia Mother    Cancer - Lung Father    Cancer Sister        Anal Cancer   Bipolar disorder Sister    Leukemia Brother    Healthy Son    Asthma Son    Breast cancer Neg Hx     ALLERGIES:  is allergic to other.  MEDICATIONS:  Current Outpatient Medications  Medication Sig Dispense Refill   Ascorbic Acid (VITAMIN C) 100 MG CHEW Chew by mouth.     aspirin  EC 81 MG tablet Take 1 tablet (81 mg total) by  mouth daily. Swallow whole.     b complex vitamins capsule Take 1 capsule by mouth daily.     Cholecalciferol (VITAMIN D3 PO) Take 4,000 Units by mouth daily. With vitamin K     EPINEPHrine  (EPIPEN  2-PAK) 0.3 mg/0.3 mL IJ SOAJ injection Inject 0.3 mg into the muscle as needed for anaphylaxis. 1 each 2   ezetimibe  (ZETIA ) 10 MG tablet Take 1 tablet (10 mg total) by mouth daily. (Patient not taking: Reported on 02/23/2024) 90 tablet 1  Glucosamine 500 MG CAPS Take 1 capsule by mouth. (Wild Shrimp Glucosamine) (Patient not taking: Reported on 02/23/2024)     hydroxychloroquine  (PLAQUENIL ) 200 MG tablet TAKE 1 TABLET BY MOUTH TWICE  DAILY MONDAY THROUGH FRIDAY  ONLY. NONE ON SATURDAY OR SUNDAY 120 tablet 0   ketoconazole (NIZORAL) 2 % shampoo Apply topically as needed.     methocarbamol  (ROBAXIN ) 500 MG tablet Take 1 tablet (500 mg total) by mouth 3 (three) times daily. (Muscle relaxer) 90 tablet 3   MISC NATURAL PRODUCTS PO Take 3 capsules by mouth daily. Bone Strength: Vitamin D3, Vitamin K1, Vitamin K2, Calcium , MAgnesium, Strontium, Vanadium     NONFORMULARY OR COMPOUNDED ITEM Place 0.5 Troches under the tongue 2 (two) times daily. Progesterone  180 mg, tri-estrogen 1.75 mg, testosterone  1.2 mg, DHEA 5 mg troche [employer], [place], [place]  Prescriber Edward Eckert     Prasterone, DHEA, (DHEA PO) Take by mouth daily.     traMADol  (ULTRAM ) 50 MG tablet Take 1 tablet (50 mg total) by mouth every 12 (twelve) hours as needed. 60 tablet 3   traZODone  (DESYREL ) 100 MG tablet Take 1 tablet (100 mg total) by mouth at bedtime. 90 tablet 3   Zinc 10 MG LOZG Use as directed in the mouth or throat.     No current facility-administered medications for this visit.    REVIEW OF SYSTEMS:   Constitutional: ( - ) fevers, ( - )  chills , ( - ) night sweats Eyes: ( - ) blurriness of vision, ( - ) double vision, ( - ) watery eyes Ears, nose, mouth, throat, and face: ( - ) mucositis, ( - ) sore  throat Respiratory: ( - ) cough, ( - ) dyspnea, ( - ) wheezes Cardiovascular: ( - ) palpitation, ( - ) chest discomfort, ( - ) lower extremity swelling Gastrointestinal:  ( - ) nausea, ( - ) heartburn, ( - ) change in bowel habits Skin: ( - ) abnormal skin rashes Lymphatics: ( - ) new lymphadenopathy, ( - ) easy bruising Neurological: ( - ) numbness, ( - ) tingling, ( - ) new weaknesses Behavioral/Psych: ( - ) mood change, ( - ) new changes  All other systems were reviewed with the patient and are negative.  PHYSICAL EXAMINATION: Vitals:   03/21/24 1016  BP: 125/69  Pulse: 71  Resp: 14  Temp: 98.3 F (36.8 C)  SpO2: 100%   Filed Weights   03/21/24 1016  Weight: 144 lb 1.6 oz (65.4 kg)    GENERAL: Well-appearing elderly Caucasian female, alert, no distress and comfortable SKIN: skin color, texture, turgor are normal, no rashes or significant lesions EYES: conjunctiva are pink and non-injected, sclera clear LUNGS: clear to auscultation and percussion with normal breathing effort HEART: regular rate & rhythm and no murmurs and no lower extremity edema Musculoskeletal: no cyanosis of digits and no clubbing  PSYCH: alert & oriented x 3, fluent speech NEURO: no focal motor/sensory deficits  LABORATORY DATA:  I have reviewed the data as listed    Latest Ref Rng & Units 03/14/2024   10:16 AM 02/23/2024   10:55 AM 09/21/2023    8:00 AM  CBC  WBC 4.0 - 10.5 K/uL 5.3  6.6  5.3   Hemoglobin 12.0 - 15.0 g/dL 85.7  85.7  86.6   Hematocrit 36.0 - 46.0 % 41.6  43.5  41.4   Platelets 150 - 400 K/uL 236  239  217  Latest Ref Rng & Units 03/14/2024   10:16 AM 02/23/2024   10:55 AM 11/05/2023   11:21 AM  CMP  Glucose 70 - 99 mg/dL 90  80  89   BUN 8 - 23 mg/dL 23  18  15    Creatinine 0.44 - 1.00 mg/dL 8.82  9.00  8.99   Sodium 135 - 145 mmol/L 137  137  137   Potassium 3.5 - 5.1 mmol/L 4.5  5.0  4.7   Chloride 98 - 111 mmol/L 107  105  103   CO2 22 - 32 mmol/L 25  28  19     Calcium  8.9 - 10.3 mg/dL 9.5  9.8  9.5   Total Protein 6.5 - 8.1 g/dL 6.5  6.2    Total Bilirubin 0.0 - 1.2 mg/dL 0.4  0.5    Alkaline Phos 38 - 126 U/L 55     AST 15 - 41 U/L 17  20    ALT 0 - 44 U/L 10  14      Lab Results  Component Value Date   MPROTEIN 0.2 (H) 03/14/2024   MPROTEIN 0.2 (H) 07/20/2023   Lab Results  Component Value Date   KPAFRELGTCHN 182.6 (H) 03/14/2024   KPAFRELGTCHN 140.3 (H) 07/20/2023   LAMBDASER 18.4 03/14/2024   LAMBDASER 15.8 07/20/2023   KAPLAMBRATIO 9.92 (H) 03/14/2024   KAPLAMBRATIO >27.57 (H) 08/03/2023   KAPLAMBRATIO 8.88 (H) 07/20/2023    RADIOGRAPHIC STUDIES: No results found.  ASSESSMENT & PLAN Kristen Jacobs 78 y.o. female with medical history significant for IgG kappa MGUS who presents for a follow up visit.   After review of the labs, review of the records, and discussion with the patient the patients findings are most consistent with a monoclonal gammopathy, IgG kappa specificity   Monoclonal Gammopathies are a group of medical conditions defined by the presence of a monoclonal protein (an M protein) in the blood or urine. Monoclonal gammopathies include monoclonal gammopathy of unknown significance (MGUS), Monoclonal gammopathies of renal or neurological significance,  smoldering multiple myeloma (SMM), multiple myeloma (MM), AL amyloidosis, and Waldenstrom macroglobulinemia. The goal of the initial workup is to determine which monoclonal gammopathy a patient has. The workup consists of evaluating protein in the serum (with serum protein electrophoresis (SPEP) and serum free light chains) , evaluating protein in the urine (UPEP), and evaluation of the skeleton (DG Bone Met Survey) to assure no lytic lesions. Baseline bloodwork includes CMP and CBC. If no CRAB criteria or high risk criteria are noted then the diagnosis is MGUS. MGUS must be followed with bloodwork periodically to assure it does not convert to multiple myeloma (occurs to  approximately 1% of patients per year). If there are CRAB criteria or high risk features (such as elevated serum free light chain ratio (taking into account renal function), a non IgG M protein, or M protein >1.5) then a bone marrow biopsy must be pursued.     #IgG kappa monoclonal Gammopathy of Undetermined Significance --today will order an SPEP, UPEP, SFLC and beta 2 microglobulin --labs today show white blood cell 5.3, hemoglobin 14.2, MCV 87.9, platelets 236.  Creatinine 1.17 and LFTs within normal limits. --M protein 0.2 with kappa free light chains of 182.6, lambda 18.4, ratio 9.92.  Will repeat UPEP at next visit. --Bone marrow biopsy performed on 09/21/2023.  Showed a hypercellular marrow with minor plasma cell population.  Plasma cells are 4% of the total population. --RTC in 6 months or sooner if intervention is required.  No orders of the defined types were placed in this encounter.  All questions were answered. The patient knows to call the clinic with any problems, questions or concerns.  A total of more than 30 minutes were spent on this encounter with face-to-face time and non-face-to-face time, including preparing to see the patient, ordering tests and/or medications, counseling the patient and coordination of care as outlined above.   Norleen IVAR Kidney, MD Department of Hematology/Oncology [facility] at [facility] Phone: [phone] Pager: [phone] Email: [email or web address]  03/21/2024 10:49 AM

## 2024-03-22 ENCOUNTER — Encounter: Payer: Self-pay | Admitting: Hematology and Oncology

## 2024-03-26 ENCOUNTER — Other Ambulatory Visit: Payer: Self-pay | Admitting: Physician Assistant

## 2024-03-28 NOTE — Telephone Encounter (Signed)
 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   Okay to refill Plaquenil ?

## 2024-03-29 ENCOUNTER — Encounter: Payer: Self-pay | Admitting: Physician Assistant

## 2024-08-02 ENCOUNTER — Ambulatory Visit: Admitting: Rheumatology

## 2024-09-22 ENCOUNTER — Inpatient Hospital Stay: Attending: Hematology and Oncology

## 2024-09-27 ENCOUNTER — Inpatient Hospital Stay: Admitting: Physician Assistant

## 2025-01-18 ENCOUNTER — Ambulatory Visit
